# Patient Record
Sex: Male | Born: 1950
Health system: Southern US, Community
[De-identification: ages and names within clinical notes are randomized; demographics above are authoritative.]

## PROBLEM LIST (undated history)

## (undated) DIAGNOSIS — H401190 Primary open-angle glaucoma, unspecified eye, stage unspecified: Secondary | ICD-10-CM

## (undated) DIAGNOSIS — R972 Elevated prostate specific antigen [PSA]: Secondary | ICD-10-CM

## (undated) DIAGNOSIS — C801 Malignant (primary) neoplasm, unspecified: Secondary | ICD-10-CM

## (undated) DIAGNOSIS — Z720 Tobacco use: Secondary | ICD-10-CM

## (undated) DIAGNOSIS — J449 Chronic obstructive pulmonary disease, unspecified: Secondary | ICD-10-CM

## (undated) DIAGNOSIS — N138 Other obstructive and reflux uropathy: Secondary | ICD-10-CM

## (undated) DIAGNOSIS — N401 Enlarged prostate with lower urinary tract symptoms: Secondary | ICD-10-CM

## (undated) DIAGNOSIS — Z9981 Dependence on supplemental oxygen: Secondary | ICD-10-CM

## (undated) DIAGNOSIS — Z972 Presence of dental prosthetic device (complete) (partial): Secondary | ICD-10-CM

## (undated) DIAGNOSIS — I10 Essential (primary) hypertension: Secondary | ICD-10-CM

## (undated) DIAGNOSIS — Z8546 Personal history of malignant neoplasm of prostate: Secondary | ICD-10-CM

## (undated) DIAGNOSIS — J939 Pneumothorax, unspecified: Secondary | ICD-10-CM

## (undated) DIAGNOSIS — J9611 Chronic respiratory failure with hypoxia: Secondary | ICD-10-CM

## (undated) HISTORY — DX: Tobacco use: Z72.0

## (undated) HISTORY — PX: COLON SURGERY: SHX602

## (undated) HISTORY — DX: Elevated prostate specific antigen (PSA): R97.20

## (undated) HISTORY — DX: Other obstructive and reflux uropathy: N13.8

## (undated) HISTORY — DX: Essential (primary) hypertension: I10

## (undated) HISTORY — DX: Benign prostatic hyperplasia with lower urinary tract symptoms: N40.1

---

## 2013-09-29 ENCOUNTER — Ambulatory Visit: Payer: Self-pay | Admitting: Family Medicine

## 2014-11-08 ENCOUNTER — Telehealth: Payer: Self-pay

## 2014-11-08 NOTE — Telephone Encounter (Signed)
Can pt have refill of tamsulosin?

## 2014-11-08 NOTE — Telephone Encounter (Signed)
Yes, but he should have an appointment in 6 months.

## 2014-11-09 NOTE — Telephone Encounter (Signed)
Called to ask pt which pharmacy he would like to use. Number in chart has been disconnected. Therefore medication has not been called into pharmacy. Cw,lpn

## 2014-11-10 ENCOUNTER — Telehealth: Payer: Self-pay

## 2014-11-10 DIAGNOSIS — N400 Enlarged prostate without lower urinary tract symptoms: Secondary | ICD-10-CM

## 2014-11-10 MED ORDER — DUTASTERIDE 0.5 MG PO CAPS: 0.5000 mg | ORAL_CAPSULE | Freq: Every day | ORAL | Status: DC

## 2014-11-10 NOTE — Telephone Encounter (Signed)
[facility] faxed a refill request. Nurse faxed back refill and made note of pt needing f/u appt in September and [facility] needs a correct phone number for pt. Nurse also sent refill via epic. Cw,lpn

## 2014-11-24 ENCOUNTER — Other Ambulatory Visit: Payer: Self-pay

## 2015-01-03 ENCOUNTER — Other Ambulatory Visit: Payer: PRIVATE HEALTH INSURANCE

## 2015-01-03 ENCOUNTER — Encounter: Payer: Self-pay | Admitting: *Deleted

## 2015-01-03 ENCOUNTER — Other Ambulatory Visit: Payer: Self-pay

## 2015-01-03 DIAGNOSIS — R972 Elevated prostate specific antigen [PSA]: Secondary | ICD-10-CM

## 2015-01-04 LAB — PSA: PROSTATE SPECIFIC AG, SERUM: 4.4 ng/mL — AB (ref 0.0–4.0)

## 2015-01-10 ENCOUNTER — Encounter: Payer: Self-pay | Admitting: Urology

## 2015-01-10 ENCOUNTER — Ambulatory Visit (INDEPENDENT_AMBULATORY_CARE_PROVIDER_SITE_OTHER): Payer: PRIVATE HEALTH INSURANCE | Admitting: Urology

## 2015-01-10 VITALS — BP 132/88 | HR 72 | Ht 76.0 in | Wt 180.2 lb

## 2015-01-10 DIAGNOSIS — C61 Malignant neoplasm of prostate: Secondary | ICD-10-CM | POA: Insufficient documentation

## 2015-01-10 DIAGNOSIS — N138 Other obstructive and reflux uropathy: Secondary | ICD-10-CM | POA: Insufficient documentation

## 2015-01-10 DIAGNOSIS — R972 Elevated prostate specific antigen [PSA]: Secondary | ICD-10-CM

## 2015-01-10 DIAGNOSIS — N401 Enlarged prostate with lower urinary tract symptoms: Secondary | ICD-10-CM | POA: Diagnosis not present

## 2015-01-10 LAB — BLADDER SCAN AMB NON-IMAGING: Scan Result: 0

## 2015-01-10 MED ORDER — CIALIS 20 MG PO TABS: ORAL_TABLET | ORAL | Status: DC

## 2015-01-10 NOTE — Progress Notes (Signed)
01/10/2015 9:53 AM   Stephen Madden 31-Mar-1951 [id number]  Referring provider: No referring provider defined for this encounter.  Chief Complaint  Patient presents with  . Elevated PSA    6 month recheck and psa lab results from the 15th  . Sperm in urine    Patient states the med dutasteride causes this?    HPI: Stephen Madden is a 64 year old African American male with a history of elevated PSA, ED and BPH with LUTS who presents today for a 6 month follow up.    Elevated PSA: Patient has had an elevation of his PSA from 2.8 ng/mL in 2009 and 5.1 ng/mL in 2015.  This is a trend less than 0.75 ng/mL a year.  He was started on finasteride three months ago for his BPH with LUTS.  Erectile dysfunction: Patient is having adequate response to the Cialis 20 mg.  He denies any painful erections or curvature to his erections.    BPH with LUTS: His IPSS score today is 6, which is mild lower urinary tract symptomatology. He is mostly satisfied with his quality life due to his urinary symptoms. His PVR is 0 mL.  His previous IPSS score was 6/1.   He is not having any significant urinary symptoms at this time.  He denies any dysuria, hematuria or suprapubic pain.   He currently taking dutasteride.     He also denies any recent fevers, chills, nausea or vomiting.  He does not have a family history of PCa.      IPSS      01/10/15 0900       International Prostate Symptom Score   How often have you had the sensation of not emptying your bladder? Less than 1 in 5     How often have you had to urinate less than every two hours? Less than 1 in 5 times     How often have you found you stopped and started again several times when you urinated? Not at All     How often have you found it difficult to postpone urination? Less than 1 in 5 times     How often have you had a weak urinary stream? Less than half the time     How often have you had to strain to start urination? Not at All     How  many times did you typically get up at night to urinate? 1 Time     Total IPSS Score 6     Quality of Life due to urinary symptoms   If you were to spend the rest of your life with your urinary condition just the way it is now how would you feel about that? Mostly Satisfied        Score:  1-7 Mild 8-19 Moderate 20-35 Severe     PMH: Past Medical History  Diagnosis Date  . BPH with obstruction/lower urinary tract symptoms   . Elevated PSA   . Tobacco abuse   . Hypertension     Surgical History: No past surgical history on file.  Home Medications:    Medication List       This list is accurate as of: 01/10/15  9:53 AM.  Always use your most recent med list.               atenolol 50 MG tablet  Commonly known as:  TENORMIN  Take 50 mg by mouth daily.     CIALIS 20  MG tablet  Generic drug:  tadalafil  TAKE 1 TABLET BY MOUTH 1/2 TO 1 HOUR BEFORE SEXUAL ACTIVITY     clotrimazole-betamethasone cream  Commonly known as:  LOTRISONE  APPLY TO AFFECTED AREA TWICE A DAY     dutasteride 0.5 MG capsule  Commonly known as:  AVODART  Take 1 capsule (0.5 mg total) by mouth daily.     finasteride 5 MG tablet  Commonly known as:  PROSCAR  Take 5 mg by mouth daily.     NIFEdipine 30 MG 24 hr tablet  Commonly known as:  PROCARDIA-XL/ADALAT CC  Take 30 mg by mouth daily.     PROAIR HFA 108 (90 BASE) MCG/ACT inhaler  Generic drug:  albuterol  INHALE 2 PUFFS BY MOUTH EVERY 4 TO 6 HOURS AS NEEDED        Allergies: No Known Allergies  Family History: Family History  Problem Relation Age of Onset  . Heart disease Mother   . Stroke Father   . Cancer Neg Hx   . Kidney disease Neg Hx   . Prostate cancer Neg Hx     Social History:  reports that he has been smoking Cigarettes.  He has a 30 pack-year smoking history. He does not have any smokeless tobacco history on file. He reports that he drinks alcohol. He reports that he does not use illicit  drugs.  ROS: UROLOGY Frequent Urination?: No Hard to postpone urination?: No Burning/pain with urination?: No Get up at night to urinate?: No Leakage of urine?: No Urine stream starts and stops?: Yes Trouble starting stream?: No Do you have to strain to urinate?: No Blood in urine?: No Urinary tract infection?: No Sexually transmitted disease?: No Injury to kidneys or bladder?: No Painful intercourse?: No Weak stream?: No Erection problems?: No Penile pain?: No  Gastrointestinal Nausea?: No Vomiting?: No Indigestion/heartburn?: No Diarrhea?: No Constipation?: No  Constitutional Fever: No Night sweats?: No Weight loss?: No Fatigue?: No  Skin Skin rash/lesions?: No Itching?: No  Eyes Blurred vision?: No Double vision?: No  Ears/Nose/Throat Sore throat?: No Sinus problems?: No  Hematologic/Lymphatic Swollen glands?: No Easy bruising?: No  Cardiovascular Leg swelling?: No Chest pain?: No  Respiratory Cough?: Yes Shortness of breath?: No  Endocrine Excessive thirst?: No  Musculoskeletal Back pain?: No Joint pain?: No  Neurological Headaches?: No Dizziness?: No  Psychologic Depression?: No Anxiety?: No  Physical Exam: BP 132/88 mmHg  Pulse 72  Ht 6\' 4"  (1.93 m)  Wt 180 lb 3.2 oz (81.738 kg)  BMI 21.94 kg/m2  GU: Patient with uncircumcised phallus. Foreskin easily retracted.  Urethral meatus is patent.  No penile discharge. No penile lesions or rashes. Scrotum without lesions, cysts, rashes and/or edema.  Testicles are located scrotally bilaterally. No masses are appreciated in the testicles. Left and right epididymis are normal. Rectal: Patient with  normal sphincter tone. Perineum without scarring or rashes. No rectal masses are appreciated. Prostate is approximately 70 (could no palpate the entire gland due to patient clenching his buttocks) grams, no nodules are appreciated. Seminal vesicles are normal.   Laboratory Data:  Lab Results   Component Value Date   PSA 4.4* 01/03/2015   PSA History: 2.8 ng/mL on 06/12/2007 3.4 ng/mL on 03/31/2008 3.5 ng/mL on 04/06/2009 4.2 ng/mL on 09/05/2010 4.3 ng/mL on 02/26/2011 3.8 ng/mL on 02/05/2012 4.6 ng/mL on 01/27/2013 5.1 ng/mL on 02/09/2014 4.5 ng/mL on 03/26/2014   5.7 ng/mL on 09/25/2014- PSA, free 1.05  Pertinent Imaging: Results for orders placed or  performed in visit on 01/10/15  BLADDER SCAN AMB NON-IMAGING  Result Value Ref Range   Scan Result 0     Assessment & Plan:    1. Elevated PSA:    Patient's PSA trend is less that 0.75 ng/mL per year and his free and total PSA indicate a 17% probability of prostate cancer.  There is no family history of PCa.  We will continue to monitor very closely with PSA's every 6 months.  If his PSA does not continue a downward trend with the finasteride, we will consider a biopsy.  2. BPH with LUTS:   IPSS score 6/2. PVR 0 mL. DRE demonstrates enlargement. Patient will continue the dutasteride as he is mostly satisfied with his urinary symptoms. He was experiencing retrograde ejaculation and I reassured him that this was a side effect of the medication and not a danger to him. He will return in 6 months time for IPSS, DRE, PSA and PVR.   - BLADDER SCAN AMB NON-IMAGING   No Follow-up on file.  Zara Council, [facility] [address] [place], [place] [address] [phone]

## 2015-02-27 ENCOUNTER — Other Ambulatory Visit: Payer: Self-pay | Admitting: Urology

## 2015-03-17 ENCOUNTER — Telehealth: Payer: Self-pay | Admitting: Urology

## 2015-03-17 DIAGNOSIS — N400 Enlarged prostate without lower urinary tract symptoms: Secondary | ICD-10-CM

## 2015-03-17 MED ORDER — DUTASTERIDE 0.5 MG PO CAPS: 0.5000 mg | ORAL_CAPSULE | Freq: Every day | ORAL | Status: DC

## 2015-03-17 NOTE — Telephone Encounter (Signed)
Medication called into pharmacy and pt aware.

## 2015-03-17 NOTE — Telephone Encounter (Signed)
Pt states that he has about 15 days left of his Tamsulosin.  Requesting a Rx refill called in to [facility] on [address].  Pt's phone # is [phone].

## 2015-03-18 ENCOUNTER — Telehealth: Payer: Self-pay | Admitting: Radiology

## 2015-03-18 DIAGNOSIS — N400 Enlarged prostate without lower urinary tract symptoms: Secondary | ICD-10-CM

## 2015-03-18 MED ORDER — DUTASTERIDE 0.5 MG PO CAPS: 0.5000 mg | ORAL_CAPSULE | Freq: Every day | ORAL | Status: DC

## 2015-03-18 NOTE — Telephone Encounter (Signed)
Pt states dutasteride (AVODART) 0.5 MG capsule was called into the wrong pharmacy.  Also asks for refills to last through December. Wants it called in to [facility] on [address]. He asks for a return phone call. Pt's phone # is [phone].

## 2015-03-18 NOTE — Telephone Encounter (Signed)
Medication called into pt pharmacy. Pt made aware.

## 2015-04-19 ENCOUNTER — Telehealth: Payer: Self-pay | Admitting: Gastroenterology

## 2015-04-19 NOTE — Telephone Encounter (Signed)
Colonoscopy triage °

## 2015-04-25 ENCOUNTER — Other Ambulatory Visit: Payer: Self-pay

## 2015-04-25 ENCOUNTER — Telehealth: Payer: Self-pay | Admitting: Gastroenterology

## 2015-04-25 NOTE — Telephone Encounter (Signed)
Pt scheduled for colonoscopy in [place] on 05/30/15. Instructions/rx mailed.

## 2015-04-25 NOTE — Telephone Encounter (Signed)
Colonoscopy triage. Patient has called you several times

## 2015-04-25 NOTE — Telephone Encounter (Signed)
Gastroenterology Pre-Procedure Review  Request Date:  Requesting Physician: Dr. Lavera Guise  PATIENT REVIEW QUESTIONS: The patient responded to the following health history questions as indicated:    1. Are you having any GI issues? no 2. Do you have a personal history of Polyps? no 3. Do you have a family history of Colon Cancer or Polyps? no 4. Diabetes Mellitus? no 5. Joint replacements in the past 12 months?no 6. Major health problems in the past 3 months?no 7. Any artificial heart valves, MVP, or defibrillator?no    MEDICATIONS & ALLERGIES:    Patient reports the following regarding taking any anticoagulation/antiplatelet therapy:   Plavix, Coumadin, Eliquis, Xarelto, Lovenox, Pradaxa, Brilinta, or Effient? no Aspirin? no  Patient confirms/reports the following medications:  Current Outpatient Prescriptions  Medication Sig Dispense Refill  . atenolol (TENORMIN) 50 MG tablet Take 50 mg by mouth daily.    Marland Kitchen CIALIS 20 MG tablet TAKE 1 TABLET BY MOUTH 1/2 TO 1 HOUR BEFORE SEXUAL ACTIVITY 10 tablet 6  . clotrimazole-betamethasone (LOTRISONE) cream APPLY TO AFFECTED AREA TWICE A DAY  1  . dutasteride (AVODART) 0.5 MG capsule Take 1 capsule (0.5 mg total) by mouth daily. 30 capsule 3  . NIFEdipine (PROCARDIA-XL/ADALAT CC) 30 MG 24 hr tablet Take 30 mg by mouth daily.    Marland Kitchen PROAIR HFA 108 (90 BASE) MCG/ACT inhaler INHALE 2 PUFFS BY MOUTH EVERY 4 TO 6 HOURS AS NEEDED  1   No current facility-administered medications for this visit.    Patient confirms/reports the following allergies:  No Known Allergies  No orders of the defined types were placed in this encounter.    AUTHORIZATION INFORMATION Primary Insurance: 1D#: Group #:  Secondary Insurance: 1D#: Group #:  SCHEDULE INFORMATION: Date: 05/30/15 Time: Location: [place]

## 2015-04-25 NOTE — Telephone Encounter (Signed)
LVM for pt to return my call to schedule colonoscopy.  

## 2015-05-20 ENCOUNTER — Encounter: Payer: Self-pay | Admitting: *Deleted

## 2015-05-26 NOTE — Discharge Instructions (Signed)

## 2015-05-27 ENCOUNTER — Telehealth: Payer: Self-pay | Admitting: Gastroenterology

## 2015-05-27 NOTE — Telephone Encounter (Signed)
Referral has been obtained from [employer] insurance by the PCP, Dr Lavera Guise per Caryl Pina. Referral number is [id number] effective dates: 05/27/15-11/23/15.

## 2015-05-27 NOTE — Telephone Encounter (Signed)
I have called Dr Jennette Kettle office and left a message on voicemail with Caryl Pina, the referral cordonader. Patient will require a referral from [employer] insurance from his PCP so that the colonoscopy visit will be covered by insurance. I have left all insurance information on the voicemail and asked for her to call me back with the referral number so i can add it to the chart.

## 2015-05-30 ENCOUNTER — Ambulatory Visit: Payer: PPO | Admitting: Anesthesiology

## 2015-05-30 ENCOUNTER — Other Ambulatory Visit: Payer: Self-pay | Admitting: Gastroenterology

## 2015-05-30 ENCOUNTER — Ambulatory Visit
Admission: RE | Admit: 2015-05-30 | Discharge: 2015-05-30 | Disposition: A | Discharge status: Home / Self Care | Payer: PPO | Source: Ambulatory Visit | Attending: Gastroenterology | Admitting: Gastroenterology

## 2015-05-30 ENCOUNTER — Encounter
Admission: RE | Disposition: A | Discharge status: Home / Self Care | Payer: Self-pay | Source: Ambulatory Visit | Attending: Gastroenterology

## 2015-05-30 DIAGNOSIS — N400 Enlarged prostate without lower urinary tract symptoms: Secondary | ICD-10-CM | POA: Insufficient documentation

## 2015-05-30 DIAGNOSIS — Z79899 Other long term (current) drug therapy: Secondary | ICD-10-CM | POA: Insufficient documentation

## 2015-05-30 DIAGNOSIS — I10 Essential (primary) hypertension: Secondary | ICD-10-CM | POA: Insufficient documentation

## 2015-05-30 DIAGNOSIS — K388 Other specified diseases of appendix: Secondary | ICD-10-CM | POA: Diagnosis not present

## 2015-05-30 DIAGNOSIS — F17210 Nicotine dependence, cigarettes, uncomplicated: Secondary | ICD-10-CM | POA: Insufficient documentation

## 2015-05-30 DIAGNOSIS — D124 Benign neoplasm of descending colon: Secondary | ICD-10-CM | POA: Diagnosis not present

## 2015-05-30 DIAGNOSIS — D123 Benign neoplasm of transverse colon: Secondary | ICD-10-CM | POA: Insufficient documentation

## 2015-05-30 DIAGNOSIS — Z823 Family history of stroke: Secondary | ICD-10-CM | POA: Insufficient documentation

## 2015-05-30 DIAGNOSIS — D120 Benign neoplasm of cecum: Secondary | ICD-10-CM | POA: Insufficient documentation

## 2015-05-30 DIAGNOSIS — Z1211 Encounter for screening for malignant neoplasm of colon: Secondary | ICD-10-CM | POA: Diagnosis not present

## 2015-05-30 DIAGNOSIS — K5790 Diverticulosis of intestine, part unspecified, without perforation or abscess without bleeding: Secondary | ICD-10-CM | POA: Diagnosis not present

## 2015-05-30 DIAGNOSIS — D125 Benign neoplasm of sigmoid colon: Secondary | ICD-10-CM | POA: Diagnosis not present

## 2015-05-30 DIAGNOSIS — K635 Polyp of colon: Secondary | ICD-10-CM | POA: Diagnosis not present

## 2015-05-30 DIAGNOSIS — Z8249 Family history of ischemic heart disease and other diseases of the circulatory system: Secondary | ICD-10-CM | POA: Diagnosis not present

## 2015-05-30 DIAGNOSIS — D122 Benign neoplasm of ascending colon: Secondary | ICD-10-CM | POA: Insufficient documentation

## 2015-05-30 DIAGNOSIS — Z0000 Encounter for general adult medical examination without abnormal findings: Secondary | ICD-10-CM | POA: Insufficient documentation

## 2015-05-30 DIAGNOSIS — D121 Benign neoplasm of appendix: Secondary | ICD-10-CM | POA: Diagnosis not present

## 2015-05-30 DIAGNOSIS — K621 Rectal polyp: Secondary | ICD-10-CM | POA: Diagnosis not present

## 2015-05-30 HISTORY — PX: COLONOSCOPY WITH PROPOFOL: SHX5780

## 2015-05-30 HISTORY — PX: POLYPECTOMY: SHX5525

## 2015-05-30 SURGERY — COLONOSCOPY WITH PROPOFOL
Anesthesia: Monitor Anesthesia Care | Wound class: Contaminated

## 2015-05-30 MED ORDER — ALBUTEROL SULFATE HFA 108 (90 BASE) MCG/ACT IN AERS
2.0000 | INHALATION_SPRAY | Freq: Once | RESPIRATORY_TRACT | Status: AC
  Administered 2015-05-30: 2 via RESPIRATORY_TRACT

## 2015-05-30 MED ORDER — STERILE WATER FOR IRRIGATION IR SOLN
Status: DC | PRN
  Administered 2015-05-30: 10:00:00

## 2015-05-30 MED ORDER — ACETAMINOPHEN 325 MG PO TABS: 325.0000 mg | ORAL_TABLET | ORAL | Status: DC | PRN

## 2015-05-30 MED ORDER — LACTATED RINGERS IV SOLN
INTRAVENOUS | Status: DC
  Administered 2015-05-30: 09:00:00 via INTRAVENOUS

## 2015-05-30 MED ORDER — LIDOCAINE HCL (CARDIAC) 20 MG/ML IV SOLN
INTRAVENOUS | Status: DC | PRN
  Administered 2015-05-30: 30 mg via INTRAVENOUS

## 2015-05-30 MED ORDER — ACETAMINOPHEN 160 MG/5ML PO SOLN: 325.0000 mg | ORAL | Status: DC | PRN

## 2015-05-30 MED ORDER — PROPOFOL 10 MG/ML IV BOLUS
INTRAVENOUS | Status: DC | PRN
  Administered 2015-05-30 (×21): 20 mg via INTRAVENOUS
  Administered 2015-05-30: 100 mg via INTRAVENOUS
  Administered 2015-05-30: 40 mg via INTRAVENOUS
  Administered 2015-05-30 (×3): 20 mg via INTRAVENOUS

## 2015-05-30 SURGICAL SUPPLY — 28 items
CANISTER SUCT 1200ML W/VALVE (MISCELLANEOUS) ×4 IMPLANT
FCP ESCP3.2XJMB 240X2.8X (MISCELLANEOUS)
FORCEPS BIOP RAD 4 LRG CAP 4 (CUTTING FORCEPS) IMPLANT
FORCEPS BIOP RJ4 240 W/NDL (MISCELLANEOUS)
FORCEPS ESCP3.2XJMB 240X2.8X (MISCELLANEOUS) IMPLANT
GOWN CVR UNV OPN BCK APRN NK (MISCELLANEOUS) ×4 IMPLANT
GOWN ISOL THUMB LOOP REG UNIV (MISCELLANEOUS) ×4
HEMOCLIP INSTINCT (CLIP) ×20 IMPLANT
INJECTOR VARIJECT VIN23 (MISCELLANEOUS) IMPLANT
KIT CO2 TUBING (TUBING) IMPLANT
KIT DEFENDO VALVE AND CONN (KITS) IMPLANT
KIT ENDO PROCEDURE OLY (KITS) ×4 IMPLANT
LIGATOR MULTIBAND 6SHOOTER MBL (MISCELLANEOUS) IMPLANT
MARKER SPOT ENDO TATTOO 5ML (MISCELLANEOUS) IMPLANT
PAD GROUND ADULT SPLIT (MISCELLANEOUS) IMPLANT
SNARE SHORT THROW 13M SML OVAL (MISCELLANEOUS) ×4 IMPLANT
SNARE SHORT THROW 30M LRG OVAL (MISCELLANEOUS) ×4 IMPLANT
SPOT EX ENDOSCOPIC TATTOO (MISCELLANEOUS)
SUCTION POLY TRAP 4CHAMBER (MISCELLANEOUS) IMPLANT
TRAP SUCTION POLY (MISCELLANEOUS) ×8 IMPLANT
TUBING CONN 6MMX3.1M (TUBING)
TUBING SUCTION CONN 0.25 STRL (TUBING) IMPLANT
UNDERPAD 30X60 958B10 (PK) (MISCELLANEOUS) IMPLANT
VALVE BIOPSY ENDO (VALVE) IMPLANT
VARIJECT INJECTOR VIN23 (MISCELLANEOUS)
WATER AUXILLARY (MISCELLANEOUS) IMPLANT
WATER STERILE IRR 250ML POUR (IV SOLUTION) ×4 IMPLANT
WATER STERILE IRR 500ML POUR (IV SOLUTION) IMPLANT

## 2015-05-30 NOTE — Anesthesia Postprocedure Evaluation (Signed)
Anesthesia Post Note  Patient: Stephen Madden  Procedure(s) Performed: Procedure(s) (LRB): COLONOSCOPY WITH PROPOFOL (N/A) POLYPECTOMY  Patient location during evaluation: PACU Anesthesia Type: MAC Level of consciousness: awake and alert and oriented Pain management: satisfactory to patient Vital Signs Assessment: post-procedure vital signs reviewed and stable Respiratory status: spontaneous breathing, nonlabored ventilation and respiratory function stable Cardiovascular status: blood pressure returned to baseline and stable Postop Assessment: Adequate PO intake and No signs of nausea or vomiting Anesthetic complications: no    Raliegh Ip

## 2015-05-30 NOTE — Anesthesia Preprocedure Evaluation (Signed)
Anesthesia Evaluation  Patient identified by MRN, date of birth, ID band  Reviewed: Allergy & Precautions, H&P , NPO status , Patient's Chart, lab work & pertinent test results  Airway Mallampati: II  TM Distance: >3 FB Neck ROM: full    Dental no notable dental hx.    Pulmonary Current Smoker,     + wheezing      Cardiovascular hypertension,  Rhythm:regular Rate:Normal     Neuro/Psych    GI/Hepatic   Endo/Other    Renal/GU      Musculoskeletal   Abdominal   Peds  Hematology   Anesthesia Other Findings Exp wheeze B.  Reproductive/Obstetrics                             Anesthesia Physical Anesthesia Plan  ASA: II  Anesthesia Plan: MAC   Post-op Pain Management:    Induction:   Airway Management Planned:   Additional Equipment:   Intra-op Plan:   Post-operative Plan:   Informed Consent: I have reviewed the patients History and Physical, chart, labs and discussed the procedure including the risks, benefits and alternatives for the proposed anesthesia with the patient or authorized representative who has indicated his/her understanding and acceptance.     Plan Discussed with: CRNA  Anesthesia Plan Comments: (Pt to use Albuterol MDI pre-op.  Likely baseline wheeze from smoking/COPD.)        Anesthesia Quick Evaluation

## 2015-05-30 NOTE — Op Note (Signed)
[facility] Gastroenterology Patient Name: Stephen Madden Procedure Date: 05/30/2015 9:24 AM MRN: [id number] Account #: [id number] Date of Birth: 1950/06/17 Admit Type: Outpatient Age: 65 Room: [facility] OR ROOM 01 Gender: Male Note Status: Finalized Procedure:         Colonoscopy Indications:       Screening for colorectal malignant neoplasm Providers:         Lucilla Lame, MD Referring MD:      Cletis Athens, MD (Referring MD) Medicines:         Propofol per Anesthesia Complications:     No immediate complications. Procedure:         Pre-Anesthesia Assessment:                    - Prior to the procedure, a History and Physical was                     performed, and patient medications and allergies were                     reviewed. The patient's tolerance of previous anesthesia                     was also reviewed. The risks and benefits of the procedure                     and the sedation options and risks were discussed with the                     patient. All questions were answered, and informed consent                     was obtained. Prior Anticoagulants: The patient has taken                     no previous anticoagulant or antiplatelet agents. ASA                     Grade Assessment: II - A patient with mild systemic                     disease. After reviewing the risks and benefits, the                     patient was deemed in satisfactory condition to undergo                     the procedure.                    After obtaining informed consent, the colonoscope was                     passed under direct vision. Throughout the procedure, the                     patient's blood pressure, pulse, and oxygen saturations                     were monitored continuously. The Olympus CF H180AL                     colonoscope (S#: [id number]) was introduced through the anus  and advanced to the the cecum, identified by appendiceal     orifice and ileocecal valve. The colonoscopy was performed                     without difficulty. The patient tolerated the procedure                     well. The quality of the bowel preparation was excellent. Findings:      The perianal and digital rectal examinations were normal.      A 25 mm polyp was found in the cecum. The polyp was sessile. The polyp       was removed with a hot snare. Resection and retrieval were complete. To       prevent bleeding after the polypectomy, three hemostatic clips were       successfully placed (MRI compatible). There was no bleeding at the end       of the procedure.      A 7 mm polyp was found in the cecum. The polyp was sessile. The polyp       was removed with a cold snare. Resection and retrieval were complete.      A 8 mm polyp was found at the appendiceal orifice. The polyp was       sessile. This was biopsied with a cold snare for histology.      Four sessile polyps were found in the ascending colon. The polyps were 4       to 8 mm in size. These polyps were removed with a cold snare. Resection       and retrieval were complete.      Four sessile polyps were found in the transverse colon. The polyps were       7 to 10 mm in size. These polyps were removed with a cold snare.       Resection and retrieval were complete.      Twelve sessile polyps were found in the descending colon. The polyps       were 6 to 10 mm in size. These polyps were removed with a cold snare.       Resection and retrieval were complete.      A 15 mm polyp was found in the sigmoid colon. The polyp was       pedunculated. The polyp was removed with a hot snare. Resection and       retrieval were complete. To prevent bleeding after the polypectomy, two       hemostatic clips were successfully placed (MRI compatible). There was no       bleeding at the end of the procedure.      A 4 mm polyp was found in the sigmoid colon. The polyp was sessile. The       polyp was  removed with a cold snare. Resection and retrieval were       complete.      Multiple sessile polyps were found in the rectum. The polyps were 3 to 4       mm in size. Polypectomy was not attempted. Impression:        - One 25 mm polyp in the cecum. Resected and retrieved.                     MRI-compatible clips were placed.                    -  One 7 mm polyp in the cecum. Resected and retrieved.                    - One 8 mm polyp at the appendiceal orifice. Biopsied.                    - Four 4 to 8 mm polyps in the ascending colon. Resected                     and retrieved.                    - Four 7 to 10 mm polyps in the transverse colon. Resected                     and retrieved.                    - Twelve 6 to 10 mm polyps in the descending colon.                     Resected and retrieved.                    - One 15 mm polyp in the sigmoid colon. Resected and                     retrieved. MRI-compatible clips were placed.                    - One 4 mm polyp in the sigmoid colon. Resected and                     retrieved.                    - Multiple 3 to 4 mm polyps in the rectum. Resection not                     attempted. Recommendation:    - Low fiber diet for 1 week.                    - Await pathology results.                    - Return to my office in 2 weeks. Procedure Code(s): --- Professional ---                    [phone], Colonoscopy, flexible; with removal of tumor(s),                     polyp(s), or other lesion(s) by snare technique Diagnosis Code(s): --- Professional ---                    Z12.11, Encounter for screening for malignant neoplasm of                     colon                    D12.0, Benign neoplasm of cecum                    D12.1, Benign neoplasm of appendix                    D12.5, Benign neoplasm of sigmoid  colon                    D12.2, Benign neoplasm of ascending colon                    D12.3, Benign neoplasm of transverse  colon                    D12.4, Benign neoplasm of descending colon                    K62.1, Rectal polyp CPT copyright 2014 American Medical Association. All rights reserved. The codes documented in this report are preliminary and upon coder review may  be revised to meet current compliance requirements. Lucilla Lame, MD 05/30/2015 10:34:48 AM This report has been signed electronically. Number of Addenda: 0 Note Initiated On: 05/30/2015 9:24 AM Scope Withdrawal Time: 0 hours 51 minutes 16 seconds  Total Procedure Duration: 0 hours 56 minutes 3 seconds       [facility]

## 2015-05-30 NOTE — Transfer of Care (Signed)
Immediate Anesthesia Transfer of Care Note  Patient: Stephen Madden  Procedure(s) Performed: Procedure(s): COLONOSCOPY WITH PROPOFOL (N/A) POLYPECTOMY  Patient Location: PACU  Anesthesia Type: MAC  Level of Consciousness: awake, alert  and patient cooperative  Airway and Oxygen Therapy: Patient Spontanous Breathing and Patient connected to supplemental oxygen  Post-op Assessment: Post-op Vital signs reviewed, Patient's Cardiovascular Status Stable, Respiratory Function Stable, Patent Airway and No signs of Nausea or vomiting  Post-op Vital Signs: Reviewed and stable  Complications: No apparent anesthesia complications

## 2015-05-30 NOTE — H&P (Signed)
  [facility]  [address] [place], [place] [address] Phone: [phone] Fax : [phone]  Primary Care Physician:  Cletis Athens, MD Primary Gastroenterologist:  Dr. Allen Norris  Pre-Procedure History & Physical: HPI:  Stephen Madden is a 65 y.o. male is here for a screening colonoscopy.   Past Medical History  Diagnosis Date  . BPH with obstruction/lower urinary tract symptoms   . Elevated PSA   . Tobacco abuse   . Hypertension     History reviewed. No pertinent past surgical history.  Prior to Admission medications   Medication Sig Start Date End Date Taking? Authorizing Provider  atenolol (TENORMIN) 50 MG tablet Take 50 mg by mouth daily.   Yes Historical Provider, MD  CIALIS 20 MG tablet TAKE 1 TABLET BY MOUTH 1/2 TO 1 HOUR BEFORE SEXUAL ACTIVITY 01/10/15  Yes Shannon A McGowan, PA-C  clotrimazole-betamethasone (LOTRISONE) cream APPLY TO AFFECTED AREA TWICE A DAY 12/14/14  Yes Historical Provider, MD  dutasteride (AVODART) 0.5 MG capsule Take 1 capsule (0.5 mg total) by mouth daily. 03/18/15  Yes Shannon A McGowan, PA-C  NIFEdipine (PROCARDIA-XL/ADALAT CC) 30 MG 24 hr tablet Take 30 mg by mouth daily.   Yes Historical Provider, MD  PROAIR HFA 108 (90 BASE) MCG/ACT inhaler INHALE 2 PUFFS BY MOUTH EVERY 4 TO 6 HOURS AS NEEDED 12/25/14  Yes Historical Provider, MD    Allergies as of 04/25/2015  . (No Known Allergies)    Family History  Problem Relation Age of Onset  . Heart disease Mother   . Stroke Father   . Cancer Neg Hx   . Kidney disease Neg Hx   . Prostate cancer Neg Hx     Social History   Social History  . Marital Status: Married    Spouse Name: N/A  . Number of Children: N/A  . Years of Education: N/A   Occupational History  . Not on file.   Social History Main Topics  . Smoking status: Current Every Day Smoker -- 1.00 packs/day for 30 years    Types: Cigarettes  . Smokeless tobacco: Not on file  . Alcohol Use: 0.0 oz/week    0  Standard drinks or equivalent per week     Comment: occasional  . Drug Use: No  . Sexual Activity: Not on file   Other Topics Concern  . Not on file   Social History Narrative    Review of Systems: See HPI, otherwise negative ROS  Physical Exam: BP 158/102 mmHg  Pulse 68  Temp(Src) 97.8 F (36.6 C) (Temporal)  Resp 16  Ht 6\' 4"  (1.93 m)  Wt 183 lb (83.008 kg)  BMI 22.28 kg/m2  SpO2 99% General:   Alert,  pleasant and cooperative in NAD Head:  Normocephalic and atraumatic. Neck:  Supple; no masses or thyromegaly. Lungs:  Clear throughout to auscultation.    Heart:  Regular rate and rhythm. Abdomen:  Soft, nontender and nondistended. Normal bowel sounds, without guarding, and without rebound.   Neurologic:  Alert and  oriented x4;  grossly normal neurologically.  Impression/Plan: Stephen Madden is now here to undergo a screening colonoscopy.  Risks, benefits, and alternatives regarding colonoscopy have been reviewed with the patient.  Questions have been answered.  All parties agreeable.

## 2015-05-31 ENCOUNTER — Encounter: Payer: Self-pay | Admitting: Gastroenterology

## 2015-06-06 ENCOUNTER — Telehealth: Payer: Self-pay

## 2015-06-06 NOTE — Telephone Encounter (Signed)
Pt has a follow up appt scheduled for Wed, Jan 18th. Pt is aware of appt date, time and location.

## 2015-06-06 NOTE — Telephone Encounter (Signed)
-----   Message from Lucilla Lame, MD sent at 06/06/2015  7:59 AM EST ----- This patient had a polyp that was at the appendix that needs to be surgically removed. Please have him follow up in the office with me.

## 2015-06-08 ENCOUNTER — Ambulatory Visit (INDEPENDENT_AMBULATORY_CARE_PROVIDER_SITE_OTHER): Payer: PPO | Admitting: Gastroenterology

## 2015-06-08 ENCOUNTER — Ambulatory Visit (INDEPENDENT_AMBULATORY_CARE_PROVIDER_SITE_OTHER): Payer: PPO | Admitting: Surgery

## 2015-06-08 ENCOUNTER — Encounter: Payer: Self-pay | Admitting: Gastroenterology

## 2015-06-08 ENCOUNTER — Encounter: Payer: Self-pay | Admitting: Surgery

## 2015-06-08 VITALS — BP 139/85 | HR 95 | Temp 99.1°F | Ht 76.0 in | Wt 186.0 lb

## 2015-06-08 VITALS — BP 137/77 | HR 108 | Temp 99.4°F | Ht 76.0 in | Wt 186.0 lb

## 2015-06-08 DIAGNOSIS — D121 Benign neoplasm of appendix: Secondary | ICD-10-CM

## 2015-06-08 DIAGNOSIS — D120 Benign neoplasm of cecum: Secondary | ICD-10-CM | POA: Diagnosis not present

## 2015-06-08 DIAGNOSIS — D124 Benign neoplasm of descending colon: Secondary | ICD-10-CM

## 2015-06-08 DIAGNOSIS — Z86010 Personal history of colonic polyps: Secondary | ICD-10-CM | POA: Diagnosis not present

## 2015-06-08 DIAGNOSIS — D122 Benign neoplasm of ascending colon: Secondary | ICD-10-CM | POA: Diagnosis not present

## 2015-06-08 DIAGNOSIS — K621 Rectal polyp: Secondary | ICD-10-CM | POA: Diagnosis not present

## 2015-06-08 DIAGNOSIS — D125 Benign neoplasm of sigmoid colon: Secondary | ICD-10-CM | POA: Diagnosis not present

## 2015-06-08 DIAGNOSIS — D123 Benign neoplasm of transverse colon: Secondary | ICD-10-CM | POA: Diagnosis not present

## 2015-06-08 NOTE — Patient Instructions (Signed)
You will need to have a portion of a Colon removed. You have requested that we discuss this on a day where the rest of your family can attend an appointment. Please call after you speak with your family and decide on a good day to come in and talk with Dr. Azalee Course about this.

## 2015-06-08 NOTE — Progress Notes (Signed)
Primary Care Physician: Cletis Athens, MD  Primary Gastroenterologist:  Dr. Lucilla Lame  No chief complaint on file.   HPI: Stephen Madden is a 65 y.o. male here for follow-up after having a colonoscopy with greater than 20 polyps taken out. The pathology measurements showed the polyps to be as large as 3 cm. The patient did have 1 polyp that was straddling the appendiceal orifice that was biopsied but not removed. He reports that he has had no problems since the colonoscopy except he thinks he is urinating more frequently than before. All the polyps removed were sent off for pathology and the majority of them were adenomatous. The polyp in the cecum that was straddling the appendiceal orifice was also found to be a serrated polyp.  Current Outpatient Prescriptions  Medication Sig Dispense Refill  . atenolol (TENORMIN) 50 MG tablet Take 50 mg by mouth daily.    Marland Kitchen CIALIS 20 MG tablet TAKE 1 TABLET BY MOUTH 1/2 TO 1 HOUR BEFORE SEXUAL ACTIVITY 10 tablet 6  . clotrimazole-betamethasone (LOTRISONE) cream APPLY TO AFFECTED AREA TWICE A DAY  1  . dutasteride (AVODART) 0.5 MG capsule Take 1 capsule (0.5 mg total) by mouth daily. 30 capsule 3  . NIFEdipine (PROCARDIA-XL/ADALAT CC) 30 MG 24 hr tablet Take 30 mg by mouth daily.    Marland Kitchen PROAIR HFA 108 (90 BASE) MCG/ACT inhaler INHALE 2 PUFFS BY MOUTH EVERY 4 TO 6 HOURS AS NEEDED  1   No current facility-administered medications for this visit.    Allergies as of 06/08/2015  . (No Known Allergies)    ROS:  General: Negative for anorexia, weight loss, fever, chills, fatigue, weakness. ENT: Negative for hoarseness, difficulty swallowing , nasal congestion. CV: Negative for chest pain, angina, palpitations, dyspnea on exertion, peripheral edema.  Respiratory: Negative for dyspnea at rest, dyspnea on exertion, cough, sputum, wheezing.  GI: See history of present illness. GU:  Negative for dysuria, hematuria, urinary incontinence, urinary frequency,  nocturnal urination.  Endo: Negative for unusual weight change.    Physical Examination:   BP 139/85 mmHg  Pulse 95  Temp(Src) 99.1 F (37.3 C) (Oral)  Ht 6\' 4"  (1.93 m)  Wt 186 lb (84.369 kg)  BMI 22.65 kg/m2  General: Well-nourished, well-developed in no acute distress.  Eyes: No icterus. Conjunctivae pink. Mouth: Oropharyngeal mucosa moist and pink , no lesions erythema or exudate. Lungs: Clear to auscultation bilaterally. Non-labored. Heart: Regular rate and rhythm, no murmurs rubs or gallops.  Abdomen: Bowel sounds are normal, nontender, nondistended, no hepatosplenomegaly or masses, no abdominal bruits or hernia , no rebound or guarding.   Extremities: No lower extremity edema. No clubbing or deformities. Neuro: Alert and oriented x 3.  Grossly intact. Skin: Warm and dry, no jaundice.   Psych: Alert and cooperative, normal mood and affect.  Labs:    Imaging Studies: No results found.  Assessment and Plan:   Stephen Madden is a 33 y.o. y/o male comes in for follow-up after having a colonoscopy. The patient had multiple polyps taken throughout his colon in excess of 20. The only polyp that was not removed was the one in the cecum straddling the appendiceal orifice. The patient has been told that the extent of the polyp is not known and it may be growing into the appendiceal orifice. Therefore the patient has been recommended to see a surgeon for surgical removal of this area. As explained the plan and agrees with it and will add an appointment made with  the surgeon.   Note: This dictation was prepared with Dragon dictation along with smaller phrase technology. Any transcriptional errors that result from this process are unintentional.

## 2015-06-09 ENCOUNTER — Telehealth: Payer: Self-pay | Admitting: Surgery

## 2015-06-09 NOTE — Telephone Encounter (Signed)
Patient stated you talked to him yesterday about an appointment with Dr. Azalee Course about surgery. He wanted to know would the 26th be ok?

## 2015-06-09 NOTE — Telephone Encounter (Signed)
Yes this will be fine. I have placed an appointment in for 06/16/15 at 1500 in the [place] office. Please make patient aware of this appointment.

## 2015-06-10 ENCOUNTER — Telehealth: Payer: Self-pay | Admitting: Surgery

## 2015-06-10 ENCOUNTER — Encounter: Payer: Self-pay | Admitting: Surgery

## 2015-06-10 NOTE — Progress Notes (Signed)
Subjective:     Patient ID: Stephen Madden, male   DOB: [birth year], 65 y.o.   MRN: [id number]  HPI  65 yr old male otherwise healthy who underwent first screening colonoscopy last week.  Patient had several large polyps removed from various areas of the colon.  The patient has not noticed any fever, chills, fatigue, weight loss, diarrhea, constipation, melena, blood in stool, change in caliber of stool, or dysuria.  He does not have any family history of colon cancers or multiple polyps.    Past Medical History  Diagnosis Date  . BPH with obstruction/lower urinary tract symptoms   . Elevated PSA   . Tobacco abuse   . Hypertension    Past Surgical History  Procedure Laterality Date  . Colonoscopy with propofol N/A 05/30/2015    Procedure: COLONOSCOPY WITH PROPOFOL;  Surgeon: Lucilla Lame, MD;  Location: [place];  Service: Endoscopy;  Laterality: N/A;  . Polypectomy  05/30/2015    Procedure: POLYPECTOMY;  Surgeon: Lucilla Lame, MD;  Location: [place];  Service: Endoscopy;;   Family History  Problem Relation Age of Onset  . Heart disease Mother   . Stroke Father   . Cancer Neg Hx   . Kidney disease Neg Hx   . Prostate cancer Neg Hx    Social History   Social History  . Marital Status: Married    Spouse Name: N/A  . Number of Children: N/A  . Years of Education: N/A   Social History Main Topics  . Smoking status: Current Every Day Smoker -- 1.00 packs/day for 30 years    Types: Cigarettes  . Smokeless tobacco: Never Used  . Alcohol Use: 0.0 oz/week    0 Standard drinks or equivalent per week     Comment: occasional  . Drug Use: No  . Sexual Activity: Not Asked   Other Topics Concern  . None   Social History Narrative    Current outpatient prescriptions:  .  atenolol (TENORMIN) 50 MG tablet, Take 50 mg by mouth daily., Disp: , Rfl:  .  CIALIS 20 MG tablet, TAKE 1 TABLET BY MOUTH 1/2 TO 1 HOUR BEFORE SEXUAL ACTIVITY, Disp: 10 tablet, Rfl: 6 .   clotrimazole-betamethasone (LOTRISONE) cream, APPLY TO AFFECTED AREA TWICE A DAY, Disp: , Rfl: 1 .  dutasteride (AVODART) 0.5 MG capsule, Take 1 capsule (0.5 mg total) by mouth daily., Disp: 30 capsule, Rfl: 3 .  NIFEdipine (PROCARDIA-XL/ADALAT CC) 30 MG 24 hr tablet, Take 30 mg by mouth daily., Disp: , Rfl:  .  PROAIR HFA 108 (90 BASE) MCG/ACT inhaler, INHALE 2 PUFFS BY MOUTH EVERY 4 TO 6 HOURS AS NEEDED, Disp: , Rfl: 1 No Known Allergies     Review of Systems  Constitutional: Negative for fever, chills, activity change, appetite change, fatigue and unexpected weight change.  HENT: Negative for congestion and sore throat.   Respiratory: Negative for cough, shortness of breath and wheezing.   Cardiovascular: Negative for chest pain, palpitations and leg swelling.  Gastrointestinal: Negative for nausea, vomiting, abdominal pain, diarrhea, constipation, blood in stool, abdominal distention and anal bleeding.  Genitourinary: Negative for dysuria and hematuria.  Musculoskeletal: Negative for back pain and neck pain.  Skin: Negative for color change, pallor, rash and wound.  Neurological: Negative for dizziness and weakness.  Hematological: Negative for adenopathy. Does not bruise/bleed easily.  Psychiatric/Behavioral: Negative for agitation. The patient is not nervous/anxious.   All other systems reviewed and are negative.  Filed Vitals:   06/08/15 1413  BP: 137/77  Pulse: 108  Temp: 99.4 F (37.4 C)    Objective:   Physical Exam  Constitutional: He is oriented to person, place, and time. He appears well-developed and well-nourished. No distress.  HENT:  Head: Normocephalic and atraumatic.  Right Ear: External ear normal.  Left Ear: External ear normal.  Nose: Nose normal.  Mouth/Throat: Oropharynx is clear and moist. No oropharyngeal exudate.  Eyes: Conjunctivae are normal. Pupils are equal, round, and reactive to light. No scleral icterus.  Neck: Normal range of motion.  Neck supple. No tracheal deviation present.  Cardiovascular: Normal rate, regular rhythm, normal heart sounds and intact distal pulses.  Exam reveals no gallop and no friction rub.   No murmur heard. Pulmonary/Chest: Effort normal and breath sounds normal. No respiratory distress. He has no wheezes. He has no rales.  Abdominal: Soft. Bowel sounds are normal. He exhibits no distension and no mass. There is no tenderness. There is no rebound.  Musculoskeletal: Normal range of motion. He exhibits no edema or tenderness.  Lymphadenopathy:    He has no cervical adenopathy.  Neurological: He is alert and oriented to person, place, and time. No cranial nerve deficit.  Skin: Skin is warm and dry. No rash noted. No erythema.  Psychiatric: He has a normal mood and affect. His behavior is normal. Judgment and thought content normal.  Vitals reviewed.      Assessment:     65 yr old male with multiple colonic polyps     Plan:     I discussed with the patient that given the multiple polyps, including >3cm polyp in ascending colon, and serrated polyp at appendiceal orifice that was unable to be completely removed, I would recommend Laparoscopic Right hemicolectomy.  I discussed with him that given the large size and type of polyps that he had about a 30% chance that there was a cancer in the area currently that had not been removed.  I also explained that he could choose to wait and be screened again in a year, but would  not advise this with the type of polyps and size removed.  I discussed the risks, benefits and expected outcomes including bleeding, infection, abscess formation, anastomotic leak, need for drain placement, need for further procedures, possible need for ostomy placement, potentital need for blood products, risks of anesthetic such as heart attack, stroke, blood clots and death.  Patient was given the opportunity to ask questions and have them answer.  He would like to return with his wife and  family to discuss the surgery further prior to scheduling.  Will have him come back in approximately 2 weeks.

## 2015-06-10 NOTE — Telephone Encounter (Signed)
I have called patient and advised him that he will need to come to his appointment on 06/16/15 @ 3:00pm with Dr Azalee Course in [place] to discuss surgery and for consents. Patient was ok with this and will attend his appointment with his family.

## 2015-06-10 NOTE — Telephone Encounter (Signed)
Left a voice message with patient regarding his appointment time.

## 2015-06-10 NOTE — Telephone Encounter (Signed)
Angie, please call patient to schedule his surgery. He had an appointment with Dr Azalee Course on 1/18 and had requested before surgery was scheduled that another appointment be made where the rest of his family could attend and discuss the details of the surgery and to schedule the surgery. He has now decided that he does not want to come back in to do that. He is satisfied with his appointment he had with Dr Azalee Course on 1/18 and would like to move forward to schedule his surgery.

## 2015-06-16 ENCOUNTER — Telehealth: Payer: Self-pay | Admitting: Surgery

## 2015-06-16 ENCOUNTER — Ambulatory Visit (INDEPENDENT_AMBULATORY_CARE_PROVIDER_SITE_OTHER): Payer: PPO | Admitting: Surgery

## 2015-06-16 ENCOUNTER — Encounter: Payer: Self-pay | Admitting: Surgery

## 2015-06-16 VITALS — BP 128/83 | HR 89 | Temp 98.1°F | Wt 188.0 lb

## 2015-06-16 DIAGNOSIS — D122 Benign neoplasm of ascending colon: Secondary | ICD-10-CM

## 2015-06-16 DIAGNOSIS — D121 Benign neoplasm of appendix: Secondary | ICD-10-CM | POA: Diagnosis not present

## 2015-06-16 DIAGNOSIS — D120 Benign neoplasm of cecum: Secondary | ICD-10-CM

## 2015-06-16 NOTE — Progress Notes (Signed)
Subjective:     Patient ID: Stephen Madden, male   DOB: 03-Aug-1950, 65 y.o.   MRN: [id number]  HPI  64 yr old male otherwise healthy who underwent first screening colonoscopy last week. Patient had several large polyps removed from various areas of the colon. Discussed with the patient that since appendiceal polyp not completely removed, and large polyps in ascending colon, would recommend, Laparoscopic right colon resection.  Patient wanted to discuss with his family and return.  He is back today with his wife, daughter and son.  The patient has not noticed any fever, chills, fatigue, weight loss, diarrhea, constipation, melena, blood in stool, change in caliber of stool, or dysuria. He does not have any family history of colon cancers or multiple polyps  Past Medical History  Diagnosis Date  . BPH with obstruction/lower urinary tract symptoms   . Elevated PSA   . Tobacco abuse   . Hypertension    Past Surgical History  Procedure Laterality Date  . Colonoscopy with propofol N/A 05/30/2015    Procedure: COLONOSCOPY WITH PROPOFOL;  Surgeon: Lucilla Lame, MD;  Location: [place];  Service: Endoscopy;  Laterality: N/A;  . Polypectomy  05/30/2015    Procedure: POLYPECTOMY;  Surgeon: Lucilla Lame, MD;  Location: [place];  Service: Endoscopy;;   Family History  Problem Relation Age of Onset  . Heart disease Mother   . Stroke Father   . Cancer Neg Hx   . Kidney disease Neg Hx   . Prostate cancer Neg Hx    Social History   Social History  . Marital Status: Married    Spouse Name: N/A  . Number of Children: N/A  . Years of Education: N/A   Social History Main Topics  . Smoking status: Current Every Day Smoker -- 1.00 packs/day for 30 years    Types: Cigarettes  . Smokeless tobacco: Never Used  . Alcohol Use: 0.0 oz/week    0 Standard drinks or equivalent per week     Comment: occasional  . Drug Use: No  . Sexual Activity: Not Asked   Other Topics Concern  . None    Social History Narrative    Current outpatient prescriptions:  .  atenolol (TENORMIN) 50 MG tablet, Take 50 mg by mouth daily., Disp: , Rfl:  .  CIALIS 20 MG tablet, TAKE 1 TABLET BY MOUTH 1/2 TO 1 HOUR BEFORE SEXUAL ACTIVITY, Disp: 10 tablet, Rfl: 6 .  clotrimazole-betamethasone (LOTRISONE) cream, APPLY TO AFFECTED AREA TWICE A DAY, Disp: , Rfl: 1 .  dutasteride (AVODART) 0.5 MG capsule, Take 1 capsule (0.5 mg total) by mouth daily., Disp: 30 capsule, Rfl: 3 .  NIFEdipine (PROCARDIA-XL/ADALAT CC) 30 MG 24 hr tablet, Take 30 mg by mouth daily., Disp: , Rfl:  .  PROAIR HFA 108 (90 BASE) MCG/ACT inhaler, INHALE 2 PUFFS BY MOUTH EVERY 4 TO 6 HOURS AS NEEDED, Disp: , Rfl: 1 No Known Allergies   Review of Systems  Constitutional: Negative for fever, chills, activity change, appetite change, fatigue and unexpected weight change.  HENT: Negative for congestion and sore throat.   Respiratory: Negative for cough, shortness of breath and wheezing.   Cardiovascular: Negative for chest pain, palpitations and leg swelling.  Gastrointestinal: Negative for nausea, vomiting, abdominal pain, diarrhea, constipation and blood in stool.  Genitourinary: Negative for dysuria and hematuria.  Musculoskeletal: Negative for back pain, gait problem and neck pain.  Skin: Negative for color change, pallor, rash and wound.  Neurological: Negative  for dizziness and weakness.  Hematological: Negative for adenopathy. Does not bruise/bleed easily.  Psychiatric/Behavioral: Negative for confusion. The patient is not nervous/anxious.   All other systems reviewed and are negative.      Objective:   Physical Exam  Constitutional: He is oriented to person, place, and time. He appears well-developed and well-nourished. No distress.  HENT:  Head: Normocephalic and atraumatic.  Right Ear: External ear normal.  Left Ear: External ear normal.  Nose: Nose normal.  Mouth/Throat: Oropharynx is clear and moist. No  oropharyngeal exudate.  Eyes: Conjunctivae are normal. Pupils are equal, round, and reactive to light. No scleral icterus.  Neck: Normal range of motion. Neck supple. No tracheal deviation present.  Cardiovascular: Normal rate, regular rhythm, normal heart sounds and intact distal pulses.  Exam reveals no gallop and no friction rub.   No murmur heard. Pulmonary/Chest: Effort normal and breath sounds normal. No respiratory distress. He has no wheezes. He has no rales.  Abdominal: Soft. Bowel sounds are normal. He exhibits no distension. There is no tenderness. There is no rebound.  Musculoskeletal: Normal range of motion. He exhibits no edema or tenderness.  Neurological: He is alert and oriented to person, place, and time. No cranial nerve deficit.  Skin: Skin is warm and dry. No rash noted. No erythema. No pallor.  Psychiatric: He has a normal mood and affect. His behavior is normal. Judgment and thought content normal.  Vitals reviewed.      Assessment:     65 yr old male with large ascending colon polyps and unremovable appendiceal orifice polyp    Plan:     I discussed with the patient that given the multiple polyps, including >3cm polyp in ascending colon, and serrated polyp at appendiceal orifice that was unable to be completely removed, I would recommend Laparoscopic Right hemicolectomy. I discussed with him that given the large size and type of polyps that he had about a 30% chance that there was a cancer in the area currently that had not been removed. I also explained that he could choose to wait and be screened again in a year, but would not advise this with the type of polyps and size removed. I discussed the risks, benefits and expected outcomes including bleeding, infection, abscess formation, anastomotic leak, need for drain placement, need for further procedures, possible need for ostomy placement, potentital need for blood products, risks of anesthetic such as heart attack,  stroke, blood clots and death. Patient was given the opportunity to ask questions and have them answer.Scheduled for Lap Right hemicolectomy on Feb 20th.

## 2015-06-16 NOTE — Telephone Encounter (Signed)
Pt advised of pre op date/time and sx date office--Bowel prep instructions were given in office and discusses as well. Sx: 07/11/15--with Dr Lenore Manner right colectomy. Pre op:   I will contact [facility] for lighted stents.  Please send Rx to [facility].  I will contact patient as well with the pre op date and time once confirmed.

## 2015-06-17 ENCOUNTER — Other Ambulatory Visit: Payer: Self-pay

## 2015-06-17 ENCOUNTER — Telehealth: Payer: Self-pay | Admitting: Surgery

## 2015-06-17 DIAGNOSIS — D120 Benign neoplasm of cecum: Secondary | ICD-10-CM

## 2015-06-17 MED ORDER — POLYETHYLENE GLYCOL 3350 17 GM/SCOOP PO POWD: 1.0000 | Freq: Once | ORAL | Status: DC

## 2015-06-17 MED ORDER — NEOMYCIN SULFATE 500 MG PO TABS: 1000.0000 mg | ORAL_TABLET | Freq: Three times a day (TID) | ORAL | Status: DC

## 2015-06-17 MED ORDER — ERYTHROMYCIN BASE 500 MG PO TABS: 1000.0000 mg | ORAL_TABLET | Freq: Three times a day (TID) | ORAL | Status: DC

## 2015-06-17 MED ORDER — BISACODYL 5 MG PO TBEC: 20.0000 mg | DELAYED_RELEASE_TABLET | Freq: Once | ORAL | Status: DC

## 2015-06-17 NOTE — Telephone Encounter (Signed)
Pt advised of pre op date/time and sx date. Sx: 07/11/15 with Dr Azalee Course with Dr Genevive Bi assisting--Laparoscopic Right Colectomy. Pre op: 07/04/15 @ 8:15 am--Office.   Dr Erlene Quan will come in at the beginning of case for stent placement.

## 2015-06-17 NOTE — Telephone Encounter (Signed)
Medications sent to pharmacy at this time. ?

## 2015-06-20 DIAGNOSIS — H40003 Preglaucoma, unspecified, bilateral: Secondary | ICD-10-CM | POA: Diagnosis not present

## 2015-06-28 DIAGNOSIS — H40003 Preglaucoma, unspecified, bilateral: Secondary | ICD-10-CM | POA: Diagnosis not present

## 2015-07-04 ENCOUNTER — Ambulatory Visit
Admission: RE | Admit: 2015-07-04 | Discharge: 2015-07-04 | Disposition: A | Discharge status: Home / Self Care | Payer: PPO | Source: Ambulatory Visit | Attending: Surgery | Admitting: Surgery

## 2015-07-04 ENCOUNTER — Encounter
Admission: RE | Admit: 2015-07-04 | Discharge: 2015-07-04 | Disposition: A | Discharge status: Home / Self Care | Payer: PPO | Source: Ambulatory Visit | Attending: Surgery | Admitting: Surgery

## 2015-07-04 DIAGNOSIS — Z01818 Encounter for other preprocedural examination: Secondary | ICD-10-CM

## 2015-07-04 DIAGNOSIS — Z01810 Encounter for preprocedural cardiovascular examination: Secondary | ICD-10-CM | POA: Diagnosis not present

## 2015-07-04 DIAGNOSIS — Z01812 Encounter for preprocedural laboratory examination: Secondary | ICD-10-CM | POA: Diagnosis not present

## 2015-07-04 LAB — CBC WITH DIFFERENTIAL/PLATELET
BASOS ABS: 0 10*3/uL (ref 0–0.1)
BASOS PCT: 1 %
EOS PCT: 7 %
Eosinophils Absolute: 0.4 10*3/uL (ref 0–0.7)
HEMATOCRIT: 44 % (ref 40.0–52.0)
Hemoglobin: 14.6 g/dL (ref 13.0–18.0)
LYMPHS ABS: 1.6 10*3/uL (ref 1.0–3.6)
Lymphocytes Relative: 31 %
MCH: 31.1 pg (ref 26.0–34.0)
MCHC: 33.1 g/dL (ref 32.0–36.0)
MCV: 93.9 fL (ref 80.0–100.0)
MONOS PCT: 9 %
Monocytes Absolute: 0.5 10*3/uL (ref 0.2–1.0)
Neutro Abs: 2.8 10*3/uL (ref 1.4–6.5)
Neutrophils Relative %: 52 %
PLATELETS: 168 10*3/uL (ref 150–440)
RBC: 4.68 MIL/uL (ref 4.40–5.90)
RDW: 13.8 % (ref 11.5–14.5)
WBC: 5.4 10*3/uL (ref 3.8–10.6)

## 2015-07-04 LAB — SURGICAL PCR SCREEN
MRSA, PCR: NEGATIVE
Staphylococcus aureus: NEGATIVE

## 2015-07-04 LAB — COMPREHENSIVE METABOLIC PANEL
ALBUMIN: 4.4 g/dL (ref 3.5–5.0)
ALT: 21 U/L (ref 17–63)
AST: 26 U/L (ref 15–41)
Alkaline Phosphatase: 74 U/L (ref 38–126)
Anion gap: 7 (ref 5–15)
BUN: 13 mg/dL (ref 6–20)
CHLORIDE: 102 mmol/L (ref 101–111)
CO2: 32 mmol/L (ref 22–32)
CREATININE: 1.06 mg/dL (ref 0.61–1.24)
Calcium: 9.4 mg/dL (ref 8.9–10.3)
GFR calc Af Amer: >60 mL/min (ref >60)
GFR calc non Af Amer: >60 mL/min (ref >60)
GLUCOSE: 98 mg/dL (ref 65–99)
POTASSIUM: 4.1 mmol/L (ref 3.5–5.1)
Sodium: 141 mmol/L (ref 135–145)
Total Bilirubin: 1 mg/dL (ref 0.3–1.2)
Total Protein: 7.1 g/dL (ref 6.5–8.1)

## 2015-07-04 NOTE — Patient Instructions (Signed)
  Your procedure is scheduled on: Monday Feb. 20, 2017. Report to Same Day Surgery. To find out your arrival time please call [phone] between 1PM - 3PM on Friday Feb. 17, 2017.  Remember: Instructions that are not followed completely may result in serious medical risk, up to and including death, or upon the discretion of your surgeon and anesthesiologist your surgery may need to be rescheduled.    _x___ 1. Do not eat food or drink liquids after midnight. No gum chewing or hard candies.     _x___ 2. No Alcohol for 24 hours before or after surgery.   ____ 3. Bring all medications with you on the day of surgery if instructed.    __x__ 4. Notify your doctor if there is any change in your medical condition     (cold, fever, infections).     Do not wear jewelry, make-up, hairpins, clips or nail polish.  Do not wear lotions, powders, or perfumes. You may wear deodorant.  Do not shave 48 hours prior to surgery. Men may shave face and neck.  Do not bring valuables to the hospital.    [facility] is not responsible for any belongings or valuables.               Contacts, dentures or bridgework may not be worn into surgery.  Leave your suitcase in the car. After surgery it may be brought to your room.  For patients admitted to the hospital, discharge time is determined by your treatment team.   Patients discharged the day of surgery will not be allowed to drive home.    Please read over the following fact sheets that you were given:   [facility] Preparing for Surgery  _x___ Take these medicines the morning of surgery with A SIP OF WATER:    1. atenolol (TENORMIN)  2. NIFEdipine (PROCARDIA-XL/ADALAT CC)  3. dutasteride (AVODART)    ____ Fleet Enema (as directed)   _x___ Use CHG Soap as directed  _x___ Use inhalers on the day of surgery and bring inhaler to hospital.  ____ Stop metformin 2 days prior to surgery    ____ Take 1/2 of usual insulin dose the night before surgery and  none on the morning of  surgery.   ____ Stop Coumadin/Plavix/aspirin on does not apply  __x__ Stop Anti-inflammatories Aleve, Ibuprofen, Goody's, Advil, Motrin now.  OK to take Tylenol for pain.   ____ Stop supplements until after surgery.    ____ Bring C-Pap to the hospital.

## 2015-07-11 ENCOUNTER — Other Ambulatory Visit: Payer: PRIVATE HEALTH INSURANCE

## 2015-07-11 ENCOUNTER — Inpatient Hospital Stay: Payer: PPO

## 2015-07-11 ENCOUNTER — Inpatient Hospital Stay: Payer: PPO | Admitting: Anesthesiology

## 2015-07-11 ENCOUNTER — Inpatient Hospital Stay
Admission: RE | Admit: 2015-07-11 | Discharge: 2015-07-14 | DRG: 330 | Disposition: A | Discharge status: Home / Self Care | Payer: PPO | Source: Ambulatory Visit | Attending: Surgery | Admitting: Surgery

## 2015-07-11 ENCOUNTER — Encounter: Payer: Self-pay | Admitting: *Deleted

## 2015-07-11 ENCOUNTER — Encounter
Admission: RE | Disposition: A | Discharge status: Home / Self Care | Payer: Self-pay | Source: Ambulatory Visit | Attending: Surgery

## 2015-07-11 DIAGNOSIS — J45901 Unspecified asthma with (acute) exacerbation: Secondary | ICD-10-CM | POA: Diagnosis not present

## 2015-07-11 DIAGNOSIS — F17210 Nicotine dependence, cigarettes, uncomplicated: Secondary | ICD-10-CM | POA: Diagnosis not present

## 2015-07-11 DIAGNOSIS — N179 Acute kidney failure, unspecified: Secondary | ICD-10-CM | POA: Diagnosis not present

## 2015-07-11 DIAGNOSIS — Z823 Family history of stroke: Secondary | ICD-10-CM

## 2015-07-11 DIAGNOSIS — Z8249 Family history of ischemic heart disease and other diseases of the circulatory system: Secondary | ICD-10-CM | POA: Diagnosis not present

## 2015-07-11 DIAGNOSIS — I10 Essential (primary) hypertension: Secondary | ICD-10-CM | POA: Diagnosis present

## 2015-07-11 DIAGNOSIS — K388 Other specified diseases of appendix: Secondary | ICD-10-CM | POA: Diagnosis not present

## 2015-07-11 DIAGNOSIS — Z79899 Other long term (current) drug therapy: Secondary | ICD-10-CM

## 2015-07-11 DIAGNOSIS — N401 Enlarged prostate with lower urinary tract symptoms: Secondary | ICD-10-CM | POA: Diagnosis not present

## 2015-07-11 DIAGNOSIS — D120 Benign neoplasm of cecum: Secondary | ICD-10-CM | POA: Diagnosis not present

## 2015-07-11 DIAGNOSIS — D125 Benign neoplasm of sigmoid colon: Secondary | ICD-10-CM | POA: Diagnosis not present

## 2015-07-11 DIAGNOSIS — Z9049 Acquired absence of other specified parts of digestive tract: Secondary | ICD-10-CM

## 2015-07-11 DIAGNOSIS — D121 Benign neoplasm of appendix: Secondary | ICD-10-CM | POA: Diagnosis present

## 2015-07-11 DIAGNOSIS — K635 Polyp of colon: Secondary | ICD-10-CM | POA: Diagnosis not present

## 2015-07-11 DIAGNOSIS — R972 Elevated prostate specific antigen [PSA]: Secondary | ICD-10-CM | POA: Diagnosis not present

## 2015-07-11 DIAGNOSIS — D122 Benign neoplasm of ascending colon: Secondary | ICD-10-CM | POA: Diagnosis not present

## 2015-07-11 HISTORY — PX: CYSTOSCOPY W/ RETROGRADES: SHX1426

## 2015-07-11 HISTORY — PX: CYSTOSCOPY WITH STENT PLACEMENT: SHX5790

## 2015-07-11 HISTORY — PX: LAPAROSCOPIC PARTIAL COLECTOMY: SHX5907

## 2015-07-11 SURGERY — LAPAROSCOPIC PARTIAL COLECTOMY
Anesthesia: General | Wound class: Clean Contaminated

## 2015-07-11 MED ORDER — DUTASTERIDE 0.5 MG PO CAPS
0.5000 mg | ORAL_CAPSULE | Freq: Every day | ORAL | Status: DC
  Administered 2015-07-13 – 2015-07-14 (×2): 0.5 mg via ORAL
  Filled 2015-07-11 (×4): qty 1

## 2015-07-11 MED ORDER — LACTATED RINGERS IV SOLN
INTRAVENOUS | Status: DC
  Administered 2015-07-11 (×3): via INTRAVENOUS

## 2015-07-11 MED ORDER — HYDRALAZINE HCL 20 MG/ML IJ SOLN: 10.0000 mg | INTRAMUSCULAR | Status: DC | PRN

## 2015-07-11 MED ORDER — FENTANYL CITRATE (PF) 100 MCG/2ML IJ SOLN
INTRAMUSCULAR | Status: DC | PRN
  Administered 2015-07-11: 25 ug via INTRAVENOUS
  Administered 2015-07-11: 100 ug via INTRAVENOUS
  Administered 2015-07-11 (×2): 25 ug via INTRAVENOUS

## 2015-07-11 MED ORDER — MIDAZOLAM HCL 2 MG/2ML IJ SOLN
INTRAMUSCULAR | Status: DC | PRN
  Administered 2015-07-11: 2 mg via INTRAVENOUS

## 2015-07-11 MED ORDER — EPHEDRINE SULFATE 50 MG/ML IJ SOLN
INTRAMUSCULAR | Status: DC | PRN
  Administered 2015-07-11 (×5): 10 mg via INTRAVENOUS

## 2015-07-11 MED ORDER — IOTHALAMATE MEGLUMINE 43 % IV SOLN
INTRAVENOUS | Status: DC | PRN
  Administered 2015-07-11: 13 mL

## 2015-07-11 MED ORDER — ENOXAPARIN SODIUM 40 MG/0.4ML ~~LOC~~ SOLN
40.0000 mg | SUBCUTANEOUS | Status: DC
Start: 2015-07-12 — End: 2015-07-14
  Administered 2015-07-12 – 2015-07-14 (×3): 40 mg via SUBCUTANEOUS
  Filled 2015-07-11 (×3): qty 0.4

## 2015-07-11 MED ORDER — BUPIVACAINE HCL (PF) 0.25 % IJ SOLN
INTRAMUSCULAR | Status: AC
  Filled 2015-07-11: qty 30

## 2015-07-11 MED ORDER — KETOROLAC TROMETHAMINE 15 MG/ML IJ SOLN
15.0000 mg | Freq: Four times a day (QID) | INTRAMUSCULAR | Status: DC
  Administered 2015-07-11 – 2015-07-14 (×12): 15 mg via INTRAVENOUS
  Filled 2015-07-11 (×12): qty 1

## 2015-07-11 MED ORDER — CYCLOBENZAPRINE HCL 10 MG PO TABS
10.0000 mg | ORAL_TABLET | Freq: Three times a day (TID) | ORAL | Status: DC
  Administered 2015-07-11 – 2015-07-14 (×8): 10 mg via ORAL
  Filled 2015-07-11 (×9): qty 1

## 2015-07-11 MED ORDER — ONDANSETRON HCL 4 MG/2ML IJ SOLN
INTRAMUSCULAR | Status: DC | PRN
  Administered 2015-07-11: 4 mg via INTRAVENOUS

## 2015-07-11 MED ORDER — OXYCODONE HCL 5 MG PO TABS
5.0000 mg | ORAL_TABLET | ORAL | Status: DC | PRN
  Administered 2015-07-11: 10 mg via ORAL
  Filled 2015-07-11: qty 2

## 2015-07-11 MED ORDER — ONDANSETRON 8 MG PO TBDP: 4.0000 mg | ORAL_TABLET | Freq: Four times a day (QID) | ORAL | Status: DC | PRN

## 2015-07-11 MED ORDER — FAMOTIDINE 20 MG PO TABS
20.0000 mg | ORAL_TABLET | Freq: Once | ORAL | Status: AC
  Administered 2015-07-11: 20 mg via ORAL

## 2015-07-11 MED ORDER — CHLORHEXIDINE GLUCONATE 4 % EX LIQD: 1.0000 "application " | Freq: Once | CUTANEOUS | Status: DC

## 2015-07-11 MED ORDER — FENTANYL CITRATE (PF) 100 MCG/2ML IJ SOLN
INTRAMUSCULAR | Status: AC
  Administered 2015-07-11: 25 ug via INTRAVENOUS
  Filled 2015-07-11: qty 2

## 2015-07-11 MED ORDER — ONDANSETRON HCL 4 MG/2ML IJ SOLN: 4.0000 mg | Freq: Four times a day (QID) | INTRAMUSCULAR | Status: DC | PRN

## 2015-07-11 MED ORDER — PANTOPRAZOLE SODIUM 40 MG IV SOLR
40.0000 mg | Freq: Every day | INTRAVENOUS | Status: DC
  Administered 2015-07-11 – 2015-07-13 (×3): 40 mg via INTRAVENOUS
  Filled 2015-07-11 (×3): qty 40

## 2015-07-11 MED ORDER — ONDANSETRON HCL 4 MG/2ML IJ SOLN: 4.0000 mg | Freq: Once | INTRAMUSCULAR | Status: DC | PRN

## 2015-07-11 MED ORDER — PROPOFOL 10 MG/ML IV BOLUS
INTRAVENOUS | Status: DC | PRN
  Administered 2015-07-11 (×3): 20 mg via INTRAVENOUS
  Administered 2015-07-11: 150 mg via INTRAVENOUS

## 2015-07-11 MED ORDER — FAMOTIDINE 20 MG PO TABS
ORAL_TABLET | ORAL | Status: AC
  Filled 2015-07-11: qty 1

## 2015-07-11 MED ORDER — ROCURONIUM BROMIDE 100 MG/10ML IV SOLN
INTRAVENOUS | Status: DC | PRN
  Administered 2015-07-11 (×2): 10 mg via INTRAVENOUS
  Administered 2015-07-11: 20 mg via INTRAVENOUS
  Administered 2015-07-11: 50 mg via INTRAVENOUS
  Administered 2015-07-11: 10 mg via INTRAVENOUS
  Administered 2015-07-11: 20 mg via INTRAVENOUS
  Administered 2015-07-11 (×2): 5 mg via INTRAVENOUS

## 2015-07-11 MED ORDER — PHENYLEPHRINE HCL 10 MG/ML IJ SOLN
INTRAMUSCULAR | Status: DC | PRN
  Administered 2015-07-11: 100 ug via INTRAVENOUS
  Administered 2015-07-11: 200 ug via INTRAVENOUS
  Administered 2015-07-11 (×2): 100 ug via INTRAVENOUS
  Administered 2015-07-11: 200 ug via INTRAVENOUS
  Administered 2015-07-11: 100 ug via INTRAVENOUS
  Administered 2015-07-11: 200 ug via INTRAVENOUS

## 2015-07-11 MED ORDER — GLYCOPYRROLATE 0.2 MG/ML IJ SOLN
INTRAMUSCULAR | Status: DC | PRN
  Administered 2015-07-11: .8 mg via INTRAVENOUS

## 2015-07-11 MED ORDER — BUPIVACAINE HCL (PF) 0.25 % IJ SOLN
INTRAMUSCULAR | Status: DC | PRN
  Administered 2015-07-11 (×2): 14 mL

## 2015-07-11 MED ORDER — MORPHINE SULFATE (PF) 2 MG/ML IV SOLN: 2.0000 mg | INTRAVENOUS | Status: DC | PRN

## 2015-07-11 MED ORDER — NEOSTIGMINE METHYLSULFATE 10 MG/10ML IV SOLN
INTRAVENOUS | Status: DC | PRN
Start: 2015-07-11 — End: 2015-07-11
  Administered 2015-07-11: 5 mg via INTRAVENOUS

## 2015-07-11 MED ORDER — LACTATED RINGERS IV BOLUS (SEPSIS)
1000.0000 mL | Freq: Once | INTRAVENOUS | Status: AC
  Administered 2015-07-11: 1000 mL via INTRAVENOUS

## 2015-07-11 MED ORDER — LIDOCAINE HCL (CARDIAC) 20 MG/ML IV SOLN
INTRAVENOUS | Status: DC | PRN
  Administered 2015-07-11: 100 mg via INTRAVENOUS

## 2015-07-11 MED ORDER — ALBUTEROL SULFATE (2.5 MG/3ML) 0.083% IN NEBU
2.5000 mg | INHALATION_SOLUTION | RESPIRATORY_TRACT | Status: DC | PRN
  Administered 2015-07-12: 2.5 mg via RESPIRATORY_TRACT
  Filled 2015-07-11 (×2): qty 3

## 2015-07-11 MED ORDER — FENTANYL CITRATE (PF) 100 MCG/2ML IJ SOLN
25.0000 ug | INTRAMUSCULAR | Status: DC | PRN
  Administered 2015-07-11 (×3): 25 ug via INTRAVENOUS

## 2015-07-11 MED ORDER — NIFEDIPINE ER 30 MG PO TB24
30.0000 mg | ORAL_TABLET | ORAL | Status: DC
  Administered 2015-07-12 – 2015-07-14 (×3): 30 mg via ORAL
  Filled 2015-07-11 (×7): qty 1

## 2015-07-11 MED ORDER — DEXTROSE IN LACTATED RINGERS 5 % IV SOLN
INTRAVENOUS | Status: DC
  Administered 2015-07-11: 22:00:00 via INTRAVENOUS
  Administered 2015-07-11: 125 mL/h via INTRAVENOUS
  Administered 2015-07-12 – 2015-07-14 (×4): via INTRAVENOUS

## 2015-07-11 MED ORDER — CEFOXITIN SODIUM-DEXTROSE 2-2.2 GM-% IV SOLR (PREMIX)
INTRAVENOUS | Status: AC
  Administered 2015-07-11: 2000 mg
  Filled 2015-07-11: qty 50

## 2015-07-11 MED ORDER — CEFOXITIN SODIUM-DEXTROSE 2-2.2 GM-% IV SOLR (PREMIX)
2.0000 g | Freq: Four times a day (QID) | INTRAVENOUS | Status: DC
  Administered 2015-07-11 – 2015-07-13 (×8): 2000 mg via INTRAVENOUS
  Filled 2015-07-11 (×13): qty 50

## 2015-07-11 MED ORDER — ALBUTEROL SULFATE HFA 108 (90 BASE) MCG/ACT IN AERS: 2.0000 | INHALATION_SPRAY | RESPIRATORY_TRACT | Status: DC | PRN

## 2015-07-11 MED ORDER — ATENOLOL 25 MG PO TABS
50.0000 mg | ORAL_TABLET | ORAL | Status: DC
  Administered 2015-07-12 – 2015-07-14 (×3): 50 mg via ORAL
  Filled 2015-07-11 (×3): qty 2

## 2015-07-11 MED ORDER — KETOROLAC TROMETHAMINE 30 MG/ML IJ SOLN
INTRAMUSCULAR | Status: AC
  Administered 2015-07-11: 15 mg
  Filled 2015-07-11: qty 1

## 2015-07-11 MED ORDER — ACETAMINOPHEN 500 MG PO TABS
1000.0000 mg | ORAL_TABLET | Freq: Four times a day (QID) | ORAL | Status: DC
Start: 2015-07-11 — End: 2015-07-14
  Administered 2015-07-11 – 2015-07-14 (×12): 1000 mg via ORAL
  Filled 2015-07-11 (×14): qty 2

## 2015-07-11 MED ORDER — SODIUM CHLORIDE 0.9 % IV SOLN
10000.0000 ug | INTRAVENOUS | Status: DC | PRN
  Administered 2015-07-11: 40 ug/min via INTRAVENOUS

## 2015-07-11 SURGICAL SUPPLY — 84 items
APPLIER CLIP 5 13 M/L LIGAMAX5 (MISCELLANEOUS)
BAG BILE T-TUBES STRL (MISCELLANEOUS) ×8 IMPLANT
BAG DRAIN CYSTO-URO LG1000N (MISCELLANEOUS) ×4 IMPLANT
BLADE SURG SZ10 CARB STEEL (BLADE) IMPLANT
CANISTER SUCT 1200ML W/VALVE (MISCELLANEOUS) ×4 IMPLANT
CATH FOLEY 2WAY  5CC 16FR (CATHETERS) ×2
CATH TRAY 16F METER LATEX (MISCELLANEOUS) ×4 IMPLANT
CATH URETL 5X70 OPEN END (CATHETERS) ×8 IMPLANT
CATH URTH 16FR FL 2W BLN LF (CATHETERS) ×2 IMPLANT
CHLORAPREP W/TINT 26ML (MISCELLANEOUS) ×4 IMPLANT
CLIP APPLIE 5 13 M/L LIGAMAX5 (MISCELLANEOUS) IMPLANT
CONRAY 43 FOR UROLOGY 50M (MISCELLANEOUS) ×4 IMPLANT
COVER LIGHT HANDLE STERIS (MISCELLANEOUS) ×4 IMPLANT
COVER MAYO STAND STRL (DRAPES) ×4 IMPLANT
DRAPE LEGGINS SURG 28X43 STRL (DRAPES) ×8 IMPLANT
DRAPE UNDER BUTTOCK W/FLU (DRAPES) ×4 IMPLANT
DRAPE UTILITY 15X26 TOWEL STRL (DRAPES) ×8 IMPLANT
ELECT CAUTERY BLADE 6.4 (BLADE) ×4 IMPLANT
ELECT REM PT RETURN 9FT ADLT (ELECTROSURGICAL) ×4
ELECTRODE REM PT RTRN 9FT ADLT (ELECTROSURGICAL) ×2 IMPLANT
GLOVE BIO SURGEON STRL SZ 6.5 (GLOVE) ×15 IMPLANT
GLOVE BIO SURGEON STRL SZ7 (GLOVE) ×20 IMPLANT
GLOVE BIO SURGEONS STRL SZ 6.5 (GLOVE) ×5
GLOVE PI ORTHOPRO 6.5 (GLOVE) ×4
GLOVE PI ORTHOPRO STRL 6.5 (GLOVE) ×4 IMPLANT
GOWN STRL REUS W/ TWL LRG LVL3 (GOWN DISPOSABLE) ×16 IMPLANT
GOWN STRL REUS W/ TWL LRG LVL4 (GOWN DISPOSABLE) IMPLANT
GOWN STRL REUS W/TWL LRG LVL3 (GOWN DISPOSABLE) ×16
GOWN STRL REUS W/TWL LRG LVL4 (GOWN DISPOSABLE)
GRASPER SUT TROCAR 14GX15 (MISCELLANEOUS) IMPLANT
HANDLE YANKAUER SUCT BULB TIP (MISCELLANEOUS) ×4 IMPLANT
IRRIGATION STRYKERFLOW (MISCELLANEOUS) ×2 IMPLANT
IRRIGATOR STRYKERFLOW (MISCELLANEOUS) ×4
IV NS 1000ML (IV SOLUTION) ×2
IV NS 1000ML BAXH (IV SOLUTION) ×2 IMPLANT
KIT RM TURNOVER CYSTO AR (KITS) ×8 IMPLANT
LABEL OR SOLS (LABEL) ×4 IMPLANT
LIQUID BAND (GAUZE/BANDAGES/DRESSINGS) ×4 IMPLANT
NDL SAFETY 22GX1.5 (NEEDLE) ×4 IMPLANT
NS IRRIG 500ML POUR BTL (IV SOLUTION) ×4 IMPLANT
PACK COLON CLEAN CLOSURE (MISCELLANEOUS) ×4 IMPLANT
PACK CYSTO AR (MISCELLANEOUS) ×4 IMPLANT
PACK LAP CHOLECYSTECTOMY (MISCELLANEOUS) ×4 IMPLANT
PENCIL ELECTRO HAND CTR (MISCELLANEOUS) ×4 IMPLANT
PREP PVP WINGED SPONGE (MISCELLANEOUS) ×4 IMPLANT
PUMP SINGLE ACTION SAP (PUMP) ×4 IMPLANT
RELOAD PROXIMATE 75MM BLUE (ENDOMECHANICALS) ×4 IMPLANT
RELOAD STAPLER BLUE 60MM (STAPLE) ×4 IMPLANT
RETRACTOR WOUND ALXS 18CM SML (MISCELLANEOUS) ×2 IMPLANT
RTRCTR WOUND ALEXIS O 18CM SML (MISCELLANEOUS) ×4
SEALER TISSUE G2 STRG ARTC 35C (ENDOMECHANICALS) IMPLANT
SENSORWIRE 0.038 NOT ANGLED (WIRE) ×8
SET CYSTO W/LG BORE CLAMP LF (SET/KITS/TRAYS/PACK) ×4 IMPLANT
SHEARS HARMONIC ACE PLUS 36CM (ENDOMECHANICALS) ×4 IMPLANT
SLEEVE ENDOPATH XCEL 5M (ENDOMECHANICALS) ×4 IMPLANT
SOL .9 NS 3000ML IRR  AL (IV SOLUTION) ×2
SOL .9 NS 3000ML IRR UROMATIC (IV SOLUTION) ×2 IMPLANT
SPONGE LAP 18X18 5 PK (GAUZE/BANDAGES/DRESSINGS) ×4 IMPLANT
STAPLE ECHEON FLEX 60 POW ENDO (STAPLE) IMPLANT
STAPLER ECHELON POWERED (MISCELLANEOUS) ×4 IMPLANT
STAPLER PROXIMATE 75MM BLUE (STAPLE) ×4 IMPLANT
STAPLER RELOAD BLUE 60MM (STAPLE) ×8
STAPLER SKIN PROX 35W (STAPLE) IMPLANT
STENT URET 6FRX24 CONTOUR (STENTS) IMPLANT
STENT URET 6FRX26 CONTOUR (STENTS) IMPLANT
SURGILUBE 2OZ TUBE FLIPTOP (MISCELLANEOUS) ×4 IMPLANT
SUT MNCRL 4-0 (SUTURE) ×4
SUT MNCRL 4-0 27XMFL (SUTURE) ×4
SUT MNCRL AB 4-0 PS2 18 (SUTURE) ×4 IMPLANT
SUT NYLON 2-0 (SUTURE) IMPLANT
SUT PDS 2-0 27IN (SUTURE) ×8 IMPLANT
SUT SILK 2 0 SH (SUTURE) ×8 IMPLANT
SUT SILK 3-0 (SUTURE) ×8 IMPLANT
SUT VICRYL 0 AB UR-6 (SUTURE) ×8 IMPLANT
SUTURE MNCRL 4-0 27XMF (SUTURE) ×4 IMPLANT
SYR 20CC LL (SYRINGE) ×4 IMPLANT
SYR BULB IRRIG 60ML STRL (SYRINGE) ×4 IMPLANT
SYRINGE IRR TOOMEY STRL 70CC (SYRINGE) ×4 IMPLANT
TROCAR XCEL 12X100 BLDLESS (ENDOMECHANICALS) ×4 IMPLANT
TROCAR XCEL BLUNT TIP 100MML (ENDOMECHANICALS) ×4 IMPLANT
TROCAR XCEL NON-BLD 5MMX100MML (ENDOMECHANICALS) ×4 IMPLANT
TUBING INSUFFLATOR HEATED (MISCELLANEOUS) ×4 IMPLANT
WATER STERILE IRR 1000ML POUR (IV SOLUTION) ×4 IMPLANT
WIRE SENSOR 0.038 NOT ANGLED (WIRE) ×4 IMPLANT

## 2015-07-11 NOTE — Anesthesia Procedure Notes (Signed)
Procedure Name: Intubation Date/Time: 07/11/2015 7:41 AM Performed by: Lance Muss Pre-anesthesia Checklist: Patient identified, Emergency Drugs available, Suction available and Patient being monitored Oxygen Delivery Method: Circle system utilized Preoxygenation: Pre-oxygenation with 100% oxygen Intubation Type: IV induction Ventilation: Mask ventilation without difficulty Laryngoscope Size: Mac and 4 Grade View: Grade II Tube type: Oral Tube size: 7.5 mm Number of attempts: 1 Airway Equipment and Method: Stylet Placement Confirmation: ETT inserted through vocal cords under direct vision,  positive ETCO2 and breath sounds checked- equal and bilateral Secured at: 24 cm Tube secured with: Tape Dental Injury: Teeth and Oropharynx as per pre-operative assessment

## 2015-07-11 NOTE — Anesthesia Preprocedure Evaluation (Signed)
Anesthesia Evaluation  Patient identified by MRN, date of birth, ID band Patient awake    Reviewed: Allergy & Precautions, NPO status   Airway Mallampati: II       Dental no notable dental hx.    Pulmonary COPD, Current Smoker,     + decreased breath sounds      Cardiovascular Exercise Tolerance: Good hypertension, Pt. on home beta blockers  Rhythm:Regular Rate:Normal     Neuro/Psych    GI/Hepatic negative GI ROS, Neg liver ROS,   Endo/Other  negative endocrine ROS  Renal/GU negative Renal ROS     Musculoskeletal   Abdominal Normal abdominal exam  (+)   Peds  Hematology negative hematology ROS (+)   Anesthesia Other Findings   Reproductive/Obstetrics                             Anesthesia Physical Anesthesia Plan  ASA: II  Anesthesia Plan: General   Post-op Pain Management:    Induction: Intravenous  Airway Management Planned: Oral ETT  Additional Equipment:   Intra-op Plan:   Post-operative Plan: Extubation in OR  Informed Consent: I have reviewed the patients History and Physical, chart, labs and discussed the procedure including the risks, benefits and alternatives for the proposed anesthesia with the patient or authorized representative who has indicated his/her understanding and acceptance.     Plan Discussed with: CRNA  Anesthesia Plan Comments:         Anesthesia Quick Evaluation

## 2015-07-11 NOTE — Transfer of Care (Signed)
Immediate Anesthesia Transfer of Care Note  Patient: Stephen Madden  Procedure(s) Performed: Procedure(s): Laparoscopic right hemicolectomy (N/A) CYSTOSCOPY WITH STENT PLACEMENT (Bilateral) CYSTOSCOPY WITH RETROGRADE PYELOGRAM (Bilateral)  Patient Location: PACU  Anesthesia Type:General  Level of Consciousness: awake  Airway & Oxygen Therapy: Patient Spontanous Breathing and Patient connected to face mask oxygen  Post-op Assessment: Report given to RN and Post -op Vital signs reviewed and stable  Post vital signs: Reviewed and stable  Last Vitals:  Filed Vitals:   07/11/15 0559 07/11/15 1303  BP: 124/85 102/68  Pulse: 89 61  Temp: 36.6 C 36.2 C  Resp: 16 12    Complications: No apparent anesthesia complications

## 2015-07-11 NOTE — H&P (View-Only) (Signed)
Subjective:     Patient ID: Stephen Madden, male   DOB: 03-11-1951, 65 y.o.   MRN: [id number]  HPI  65 yr old male otherwise healthy who underwent first screening colonoscopy last week. Patient had several large polyps removed from various areas of the colon. Discussed with the patient that since appendiceal polyp not completely removed, and large polyps in ascending colon, would recommend, Laparoscopic right colon resection.  Patient wanted to discuss with his family and return.  He is back today with his wife, daughter and son.  The patient has not noticed any fever, chills, fatigue, weight loss, diarrhea, constipation, melena, blood in stool, change in caliber of stool, or dysuria. He does not have any family history of colon cancers or multiple polyps  Past Medical History  Diagnosis Date  . BPH with obstruction/lower urinary tract symptoms   . Elevated PSA   . Tobacco abuse   . Hypertension    Past Surgical History  Procedure Laterality Date  . Colonoscopy with propofol N/A 05/30/2015    Procedure: COLONOSCOPY WITH PROPOFOL;  Surgeon: Lucilla Lame, MD;  Location: [facility];  Service: Endoscopy;  Laterality: N/A;  . Polypectomy  05/30/2015    Procedure: POLYPECTOMY;  Surgeon: Lucilla Lame, MD;  Location: [place] [facility];  Service: Endoscopy;;   Family History  Problem Relation Age of Onset  . Heart disease Mother   . Stroke Father   . Cancer Neg Hx   . Kidney disease Neg Hx   . Prostate cancer Neg Hx    Social History   Social History  . Marital Status: Married    Spouse Name: N/A  . Number of Children: N/A  . Years of Education: N/A   Social History Main Topics  . Smoking status: Current Every Day Smoker -- 1.00 packs/day for 30 years    Types: Cigarettes  . Smokeless tobacco: Never Used  . Alcohol Use: 0.0 oz/week    0 Standard drinks or equivalent per week     Comment: occasional  . Drug Use: No  . Sexual Activity: Not Asked   Other Topics Concern  . None    Social History Narrative    Current outpatient prescriptions:  .  atenolol (TENORMIN) 50 MG tablet, Take 50 mg by mouth daily., Disp: , Rfl:  .  CIALIS 20 MG tablet, TAKE 1 TABLET BY MOUTH 1/2 TO 1 HOUR BEFORE SEXUAL ACTIVITY, Disp: 10 tablet, Rfl: 6 .  clotrimazole-betamethasone (LOTRISONE) cream, APPLY TO AFFECTED AREA TWICE A DAY, Disp: , Rfl: 1 .  dutasteride (AVODART) 0.5 MG capsule, Take 1 capsule (0.5 mg total) by mouth daily., Disp: 30 capsule, Rfl: 3 .  NIFEdipine (PROCARDIA-XL/ADALAT CC) 30 MG 24 hr tablet, Take 30 mg by mouth daily., Disp: , Rfl:  .  PROAIR HFA 108 (90 BASE) MCG/ACT inhaler, INHALE 2 PUFFS BY MOUTH EVERY 4 TO 6 HOURS AS NEEDED, Disp: , Rfl: 1 No Known Allergies   Review of Systems  Constitutional: Negative for fever, chills, activity change, appetite change, fatigue and unexpected weight change.  HENT: Negative for congestion and sore throat.   Respiratory: Negative for cough, shortness of breath and wheezing.   Cardiovascular: Negative for chest pain, palpitations and leg swelling.  Gastrointestinal: Negative for nausea, vomiting, abdominal pain, diarrhea, constipation and blood in stool.  Genitourinary: Negative for dysuria and hematuria.  Musculoskeletal: Negative for back pain, gait problem and neck pain.  Skin: Negative for color change, pallor, rash and wound.  Neurological: Negative  for dizziness and weakness.  Hematological: Negative for adenopathy. Does not bruise/bleed easily.  Psychiatric/Behavioral: Negative for confusion. The patient is not nervous/anxious.   All other systems reviewed and are negative.      Objective:   Physical Exam  Constitutional: He is oriented to person, place, and time. He appears well-developed and well-nourished. No distress.  HENT:  Head: Normocephalic and atraumatic.  Right Ear: External ear normal.  Left Ear: External ear normal.  Nose: Nose normal.  Mouth/Throat: Oropharynx is clear and moist. No  oropharyngeal exudate.  Eyes: Conjunctivae are normal. Pupils are equal, round, and reactive to light. No scleral icterus.  Neck: Normal range of motion. Neck supple. No tracheal deviation present.  Cardiovascular: Normal rate, regular rhythm, normal heart sounds and intact distal pulses.  Exam reveals no gallop and no friction rub.   No murmur heard. Pulmonary/Chest: Effort normal and breath sounds normal. No respiratory distress. He has no wheezes. He has no rales.  Abdominal: Soft. Bowel sounds are normal. He exhibits no distension. There is no tenderness. There is no rebound.  Musculoskeletal: Normal range of motion. He exhibits no edema or tenderness.  Neurological: He is alert and oriented to person, place, and time. No cranial nerve deficit.  Skin: Skin is warm and dry. No rash noted. No erythema. No pallor.  Psychiatric: He has a normal mood and affect. His behavior is normal. Judgment and thought content normal.  Vitals reviewed.      Assessment:     65 yr old male with large ascending colon polyps and unremovable appendiceal orifice polyp    Plan:     I discussed with the patient that given the multiple polyps, including >3cm polyp in ascending colon, and serrated polyp at appendiceal orifice that was unable to be completely removed, I would recommend Laparoscopic Right hemicolectomy. I discussed with him that given the large size and type of polyps that he had about a 30% chance that there was a cancer in the area currently that had not been removed. I also explained that he could choose to wait and be screened again in a year, but would not advise this with the type of polyps and size removed. I discussed the risks, benefits and expected outcomes including bleeding, infection, abscess formation, anastomotic leak, need for drain placement, need for further procedures, possible need for ostomy placement, potentital need for blood products, risks of anesthetic such as heart attack,  stroke, blood clots and death. Patient was given the opportunity to ask questions and have them answer.Scheduled for Lap Right hemicolectomy on Feb 20th.

## 2015-07-11 NOTE — Progress Notes (Signed)
To OR with Bair Paws, thermal cap, sacral dressing, and SCDS in place

## 2015-07-11 NOTE — Op Note (Signed)
Laparoscopic Extended Right Hemicolectomy Procedure Note  Indications: This patient presents for a laparoscopic extended right hemicolectomy for an incompletely removed and evaluated polyp of the appendiceal orifice with several large polyps removed from the ascending colon.  The patient underwent a complete mechanical and antibiotic bowel prep prior to his operation.  Pre-operative Diagnosis: Polyp of the addpendiceal orifice  Post-operative Diagnosis: Polyp of the addpendiceal orifice  Surgeon: Hubbard Robinson   Assistants: Marta Lamas, MD  Anesthesia: General endotracheal anesthesia  ASA Class: 2  Procedure Details  The patient was seen in the Holding Room. The risks, benefits, complications, treatment options, and expected outcomes were discussed with the patient. The possibilities of reaction to medication, pulmonary aspiration, perforation of viscus, bleeding, recurrent infection, finding a normal colon, the need for additional procedures, failure to diagnose a condition, and creating a complication requiring transfusion or operation were discussed with the patient. The patient concurred with the proposed plan, giving informed consent.  The site of surgery properly noted/marked. The patient was taken to the Operating Room, identified as Stephen Madden and the procedure verified as Partial Colectomy. A Time Out was held and the above information confirmed.  The patient was brought to the operating room and placed supine and lithotomy position with arms tucked.  After induction of a general anesthetic, a Foley catheter was inserted after cystography and bilateral stent placement with Dr. Erlene Quan, please see separate operative note for details. The abdomen was prepped and draped in standard fashion.  One-half percent Marcaine with epinephrine was used to anesthetize the skin above the umbilicus. Bovie electrode was used to dissect down through subcutaneous tissue. A cover clamp was then used to  clamp the fascia and elevated up both inferiorly and superiorly and Bovie electrode was used to incise through the fascia and blunt finger dissection is used to enter into the peritoneal cavity. A figure-of-eight suture was placed using a 0 Vicryl. And a 11 mm Hassan trocar was then placed through this area and secured using the previously placed suture. The abdomen was then insufflated not to exceed 15 mmHg patient tolerated this well.      Exploration revealed a normal omentum, colon, small bowel, peritoneum, liver, and stomach.  A left lateral and right inferior 5-mm trocars as well as a 49mm tochar in the epigastrium were then placed after anesthetizing the skin and peritoneum with Marcaine.  The terminal ileum, cecum and hepatic flexure were then mobilized with gentle retraction of the colon in a medial direction with mobilization of the peritoneal reflection with cautery and the harmonic scalpel.  Mobilization of this area was complete to expose the retroperitoneum. The ureter was identified and spared during this dissection.  There was no blood loss during this portion of the procedure.        A 60 mm blue laparoscopic stapler was used to resect through the terminal ileum approximately 4 cm away from the ileocecal valve. The mesentery was dissected free using the harmonic scalpel the advanced hemostasis setting to go through the mesenteric vessels. This was carried up being able to see the ureter through the whole dissection through the ileocolic vessels through this mesentery. There was some adhesions of Gerota's fascia and the kidney originally dissected up with the colon is fashion in the mesentery were resected and the kidney was put back in its proper place. Mobilization of the mesentery continued through the hepatic flexure into the mid transverse colon intersecting through the right colonic artery as well. The  omentum had to be taken off of the transverse colon as well. Of note the duodenum was  also seen and was gently retracted into the retroperitoneum away from the mesentery of colon.    After completing mobilizing, the periumbilical trocar was enlarged, a small skin protector was placed in the incision. The colon was delivered through the incision.  The transverse colon was resected with a linear stapling device. The extended right hemicolectomy specimen was submitted to pathology.        A side-to-end ileocolonic anastomosis was performed through the small anterior incision with the linear stapling device. The mucosa was inspected and found to be hemostatic.  Closure was achieved with the linear stapling device.  The mesentery was then closed using a 2-0 silk suture in a running stitch. The bowel anastomosis was returned and the skin protector was just sitting clamped with a Claiborne Billings. The abdomen was then reinsufflated and inspected. The anastomosis appeared in good position in the mid abdomen with good flow to the area edges were pink. Some omentum was then placed over the top of this. The abdomen was inspected and there was no active bleeding hemostasis had been achieved. The retroperitoneum and the pelvis were minimally irrigated to ensure hemostasis of been achieved.  The instruments and sponges previously used were counted with correct counts. And a clean mayo stand was set up with the closing instruments and drapes. Gowns and gloves of all participating in the case were changed. The abdomen was then redraped with sterile drapes and a sterile electrocautery and suction was also used. The skin incisions were irrigated out. The supraumbilical incision was then closed with a running 2-0 Prolene suture.  The 12-mm trocar incisions were closed with a figure of eight 2-0 Prolene suture. The soft tissue was irrigated and the incisions were closed with 4-0 Monocryl subcuticular closure. Sterile glue was applied to the skin incisions.   Instrument, sponge, and needle counts were correct prior to  abdominal closure and at the conclusion of the case.   Findings: Extended right hemicolectomy with a ileum to transverse colonic anastomosis.    Estimated Blood Loss:  less than 50 mL         Drains: none         Total IV Fluids: 2565ml         Specimens: terminal ileum, Cecum and right colon         Implants: none         Complications:  None; patient tolerated the procedure well.         Disposition: PACU - hemodynamically stable.         Condition: stable

## 2015-07-11 NOTE — Interval H&P Note (Signed)
History and Physical Interval Note:  07/11/2015 7:12 AM  Stephen Madden  has presented today for surgery, with the diagnosis of Large unresectable polyps in right colon, BENIGN NEOPLASM OF SIGMOID COLON  The various methods of treatment have been discussed with the patient and family. After consideration of risks, benefits and other options for treatment, the patient has consented to  Procedure(s): LAPAROSCOPIC PARTIAL COLECTOMY with stent placement (N/A) CYSTOSCOPY WITH STENT PLACEMENT (Bilateral) CYSTOSCOPY WITH RETROGRADE PYELOGRAM (Bilateral) as a surgical intervention .  The patient's history has been reviewed, patient examined, no change in status, stable for surgery.  I have reviewed the patient's chart and labs.  Questions were answered to the patient's satisfaction.     Axelle Szwed L Laniyah Rosenwald

## 2015-07-11 NOTE — Anesthesia Postprocedure Evaluation (Signed)
Anesthesia Post Note  Patient: [name]  Procedure(s) Performed: Procedure(s) (LRB): Laparoscopic right hemicolectomy (N/A) CYSTOSCOPY WITH STENT PLACEMENT (Bilateral) CYSTOSCOPY WITH RETROGRADE PYELOGRAM (Bilateral)  Patient location during evaluation: PACU Anesthesia Type: General Level of consciousness: awake and awake and alert Pain management: satisfactory to patient Vital Signs Assessment: post-procedure vital signs reviewed and stable Respiratory status: nonlabored ventilation Cardiovascular status: stable Anesthetic complications: no    Last Vitals:  Filed Vitals:   07/11/15 0559 07/11/15 1303  BP: 124/85 102/68  Pulse: 89 61  Temp: 36.6 C 36.2 C  Resp: 16 12    Last Pain: There were no vitals filed for this visit.               VAN STAVEREN,Canyon Lohr

## 2015-07-11 NOTE — Op Note (Signed)
Date of procedure: 07/11/2015  Preoperative diagnosis:  1. Colonic polyp  Postoperative diagnosis:  1. Same as above  Procedure: 1. Cystoscopy 2. Bilateral retrograde pyelogram 3. Bilateral ureteral stent placement  Surgeon: Hollice Espy, MD  Anesthesia: General  Complications: None  Intraoperative findings: Normal upper tracts bilaterally  EBL: minimal   Specimens: none  Drains: 5 Fr open ended ureteral cathter x 2, 16 Fr Foley  Indication: Stephen Madden is a 65 y.o. patient with with a colonic polyp undergoing right hemicolectomy. I was asked by general surgery to place preoperative ureteral stents for the purpose of ureteral identification.  After reviewing the management options for treatment, he elected to proceed with the above surgical procedure(s). We have discussed the potential benefits and risks of the procedure, side effects of the proposed treatment, the likelihood of the patient achieving the goals of the procedure, and any potential problems that might occur during the procedure or recuperation. Informed consent has been obtained.  Description of procedure:  The patient was taken to the operating room and general anesthesia was induced.  The patient was placed in the dorsal lithotomy position, prepped and draped in the usual sterile fashion, and preoperative antibiotics were administered. A preoperative time-out was performed.   A 21 French rigid cystoscopy was advanced per urethra into the bladder. Attention was turned to the left ureteral orifice which was cannulated using a sensor wire and a 5 [place] open-ended ureteral catheter just within the UO. Gentle retrograde pouchogram was performed revealing a normal delicate appearing ureter and a decompressed left upper tract collecting system without filling defects. The wire was then advanced all the way up to level of the kidney and the open-ended ureteral catheter was advanced to the proximal ureter without  difficulty. The scope was then removed leaving the open-ended ureteral catheter in place. The left-sided catheter was cut at an angle to identify as the left side. The scope was then replaced back into the bladder attention was turned to the right ureteral orifice. The same exact procedure was performed performing a right retrograde pyelogram which was normal without filling defects, hydronephrosis. The wire was then advanced all the way up to the level of the kidney and the stent was advanced the level of the renal pelvis without difficulty. The wire was then withdrawn and the scope was removed leaving the stent in place. A 16 French Foley catheter was then advanced per urethra into the bladder and the balloon was filled with 10 cc of sterile water. Each of the open-ended ureteral stents were then individually secured to the catheter using a silk tie. IV extension tubing and bowel bags were connected to each of the open-ended ureteral catheters. At this point time, the surgery was turned over to Dr. Azalee Course who completed her portion of the case.   Plan: Patient's left stent may be removed at the end of the case. His right stent may remove removed tomorrow morning. Foley catheter may be removed per the primary team.  Hollice Espy, M.D.

## 2015-07-12 DIAGNOSIS — D121 Benign neoplasm of appendix: Secondary | ICD-10-CM | POA: Diagnosis not present

## 2015-07-12 DIAGNOSIS — N179 Acute kidney failure, unspecified: Secondary | ICD-10-CM | POA: Diagnosis not present

## 2015-07-12 LAB — BASIC METABOLIC PANEL
ANION GAP: 3 — AB (ref 5–15)
BUN: 19 mg/dL (ref 6–20)
CALCIUM: 8.4 mg/dL — AB (ref 8.9–10.3)
CO2: 28 mmol/L (ref 22–32)
Chloride: 109 mmol/L (ref 101–111)
Creatinine, Ser: 1.74 mg/dL — ABNORMAL HIGH (ref 0.61–1.24)
GFR calc Af Amer: 46 mL/min — ABNORMAL LOW (ref >60)
GFR calc non Af Amer: 39 mL/min — ABNORMAL LOW (ref >60)
GLUCOSE: 122 mg/dL — AB (ref 65–99)
Potassium: 4.3 mmol/L (ref 3.5–5.1)
SODIUM: 140 mmol/L (ref 135–145)

## 2015-07-12 LAB — CBC
HEMATOCRIT: 38.4 % — AB (ref 40.0–52.0)
HEMOGLOBIN: 12.9 g/dL — AB (ref 13.0–18.0)
MCH: 31.9 pg (ref 26.0–34.0)
MCHC: 33.5 g/dL (ref 32.0–36.0)
MCV: 95.2 fL (ref 80.0–100.0)
Platelets: 138 10*3/uL — ABNORMAL LOW (ref 150–440)
RBC: 4.04 MIL/uL — ABNORMAL LOW (ref 4.40–5.90)
RDW: 13.4 % (ref 11.5–14.5)
WBC: 8.5 10*3/uL (ref 3.8–10.6)

## 2015-07-12 MED ORDER — ALBUTEROL SULFATE (2.5 MG/3ML) 0.083% IN NEBU
2.5000 mg | INHALATION_SOLUTION | RESPIRATORY_TRACT | Status: DC
  Administered 2015-07-12 – 2015-07-13 (×5): 2.5 mg via RESPIRATORY_TRACT
  Filled 2015-07-12 (×7): qty 3

## 2015-07-12 NOTE — Progress Notes (Addendum)
65 year old postoperative day 1 from a laparoscopic right extended hemicolectomy. Patient states doing well from a pain standpoint that he only has pain when he coughs and moves around. Patient denies passing any flatus and does feel some bloating. Patient has to get up today but was denied having any help doing that by nursing staff. And states that he is at shortness of breath and wheezing since early this a.m. and he has asked for an inhaler but has had to wait quite a few hours and worsening without getting this.  Filed Vitals:   07/12/15 1257 07/12/15 2136  BP: 118/74 110/79  Pulse: 66 62  Temp: 97.7 F (36.5 C) 98.2 F (36.8 C)  Resp: 16 18   I/O last 3 completed shifts: In: 5821.3 [P.O.:1110; I.V.:861.3; Other:450; IV Piggyback:3400] Out: [id number] [Urine:1670; Blood:50] Total I/O In: 224 [I.V.:224] Out: 52 [Urine:725]   PE: Gen: mild distress Res: course wheezing throughout bilateral lungs, almost no air movement in lower lungs bilaterally, audible wheezing before listening with stethescope Cardio: RRR, no murmur Abd: soft, moderately distended, incisions c/d/i, appropriately tender Ext: 2+ pulses no edema  CBC Latest Ref Rng 07/12/2015 07/04/2015  WBC 3.8 - 10.6 K/uL 8.5 5.4  Hemoglobin 13.0 - 18.0 g/dL 12.9(L) 14.6  Hematocrit 40.0 - 52.0 % 38.4(L) 44.0  Platelets 150 - 440 K/uL 138(L) 168   CMP Latest Ref Rng 07/12/2015 07/04/2015  Glucose 65 - 99 mg/dL 122(H) 98  BUN 6 - 20 mg/dL 19 13  Creatinine 0.61 - 1.24 mg/dL 1.74(H) 1.06  Sodium 135 - 145 mmol/L 140 141  Potassium 3.5 - 5.1 mmol/L 4.3 4.1  Chloride 101 - 111 mmol/L 109 102  CO2 22 - 32 mmol/L 28 32  Calcium 8.9 - 10.3 mg/dL 8.4(L) 9.4  Total Protein 6.5 - 8.1 g/dL - 7.1  Total Bilirubin 0.3 - 1.2 mg/dL - 1.0  Alkaline Phos 38 - 126 U/L - 74  AST 15 - 41 U/L - 26  ALT 17 - 63 U/L - 56    A/P: 65 year old postoperative day 1 from a laparoscopic right extended hemicolectomy. Asthma exacerbation: I had ordered  patient to have albuterol nebulizers every 4 hours when necessary shortness of breath and wheezing, he did not get this today so I have changed this to scheduled every 4 hours to see if we can get his breathing to improve. S/p Lap Colectomy:  We will continue with a clear liquid diet is as not passing any flatus at this time. Encourage ambulation and ensured that he had orders for up ad lib. up to chair ambulate in hallway and discussed with nursing staff to make sure that he gets up today after his breathing is improved with the treatment. Acute kidney injury: Right stent was removed today, patient did have good urine output throughout the course of today, he had low urine output during the operation and I think had some hit to his kidneys due to this. Will evaluate his creatinine tomorrow to ensure improving. Appreciate urology help.

## 2015-07-12 NOTE — Progress Notes (Signed)
Urology Consult Follow Up  Subjective: Patient is resting comfortably.  VSS stable.  UOP is improving.  Foley draining red tinged urine.  Right ureteral stent with no drainage.    Anti-infectives: Anti-infectives    Start     Dose/Rate Route Frequency Ordered Stop   07/11/15 0627  cefOXItin (MEFOXIN) 2-2.2 GM-% IVPB    Comments:  Phillips Grout: cabinet override      07/11/15 [id number] 07/11/15 0729   07/11/15 0157  cefOXItin (MEFOXIN) 2 g in dextrose 50 mL IVPB (premix)     2 g 100 mL/hr over 30 Minutes Intravenous 4 times per day 07/11/15 0157        Current Facility-Administered Medications  Medication Dose Route Frequency Provider Last Rate Last Dose  . acetaminophen (TYLENOL) tablet 1,000 mg  1,000 mg Oral 4 times per day Hubbard Robinson, MD   1,000 mg at 07/12/15 [id number]  . albuterol (PROVENTIL) (2.5 MG/3ML) 0.083% nebulizer solution 2.5 mg  2.5 mg Nebulization Q4H PRN Hubbard Robinson, MD      . atenolol (TENORMIN) tablet 50 mg  50 mg Oral BH-q7a Hubbard Robinson, MD   50 mg at 07/12/15 0644  . cefOXItin (MEFOXIN) 2 g in dextrose 50 mL IVPB (premix)  2 g Intravenous 4 times per day Hubbard Robinson, MD   2,000 mg at 07/12/15 [id number]  . cyclobenzaprine (FLEXERIL) tablet 10 mg  10 mg Oral TID Hubbard Robinson, MD   10 mg at 07/11/15 2155  . dextrose 5 % in lactated ringers infusion   Intravenous Continuous Hubbard Robinson, MD 125 mL/hr at 07/12/15 0641    . dutasteride (AVODART) capsule 0.5 mg  0.5 mg Oral Daily Hubbard Robinson, MD      . enoxaparin (LOVENOX) injection 40 mg  40 mg Subcutaneous Q24H Hubbard Robinson, MD      . hydrALAZINE (APRESOLINE) injection 10 mg  10 mg Intravenous Q2H PRN Hubbard Robinson, MD      . ketorolac (TORADOL) 15 MG/ML injection 15 mg  15 mg Intravenous 4 times per day Hubbard Robinson, MD   15 mg at 07/12/15 0644  . morphine 2 MG/ML injection 2 mg  2 mg Intravenous Q2H PRN Hubbard Robinson, MD      . NIFEdipine (PROCARDIA-XL/ADALAT CC) 24  hr tablet 30 mg  30 mg Oral BH-q7a Hubbard Robinson, MD   30 mg at 07/12/15 [id number]  . ondansetron (ZOFRAN-ODT) disintegrating tablet 4 mg  4 mg Oral Q6H PRN Hubbard Robinson, MD       Or  . ondansetron (ZOFRAN) injection 4 mg  4 mg Intravenous Q6H PRN Hubbard Robinson, MD      . oxyCODONE (Oxy IR/ROXICODONE) immediate release tablet 5-10 mg  5-10 mg Oral Q4H PRN Hubbard Robinson, MD   10 mg at 07/11/15 1611  . pantoprazole (PROTONIX) injection 40 mg  40 mg Intravenous QHS Hubbard Robinson, MD   40 mg at 07/11/15 2155     Objective: Vital signs in last 24 hours: Temp:  [95 F (35 C)-98 F (36.7 C)] 97.7 F (36.5 C) (02/21 0616) Pulse Rate:  [56-70] 67 (02/21 0616) Resp:  [8-21] 16 (02/21 0616) BP: (102-124)/(59-96) 117/74 mmHg (02/21 0616) SpO2:  [88 %-100 %] 99 % (02/21 0616)  Intake/Output from previous day: 02/20 0701 - 02/21 0700 In: 5071.3 [P.O.:360; I.V.:861.3; IV Piggyback:3400] Out: 870 [Urine:820; Blood:50] Intake/Output this shift:     Physical Exam Constitutional: Well  nourished. Alert and oriented, No acute distress. HEENT: [place] AT, moist mucus membranes. Trachea midline, no masses. Cardiovascular: No clubbing, cyanosis, or edema. Respiratory: Normal respiratory effort, no increased work of breathing. Skin: No rashes, bruises or suspicious lesions. Lymph: No cervical or inguinal adenopathy. Neurologic: Grossly intact, no focal deficits, moving all 4 extremities. Psychiatric: Normal mood and affect.  Lab Results:   Recent Labs  07/12/15 0513  WBC 8.5  HGB 12.9*  HCT 38.4*  PLT 138*   BMET  Recent Labs  07/12/15 0513  NA 140  K 4.3  CL 109  CO2 28  GLUCOSE 122*  BUN 19  CREATININE 1.74*  CALCIUM 8.4*   PT/INR No results for input(s): LABPROT, INR in the last 72 hours. ABG No results for input(s): PHART, HCO3 in the last 72 hours.  Invalid input(s): PCO2, PO2  Studies/Results: Dg C-arm 1-60 Min-no Report  07/11/2015  CLINICAL  DATA: stent placement C-ARM 1-60 MINUTES Fluoroscopy was utilized by the requesting physician.  No radiographic interpretation.   Procedure Securing suture are cut and ureteral stent is removed without difficulty.   Patient tolerated the procedure well.   Assessment: s/p Procedure(s): Laparoscopic right hemicolectomy CYSTOSCOPY WITH STENT PLACEMENT CYSTOSCOPY WITH RETROGRADE PYELOGRAM Patient underwent cystoscopy with bilateral retrogrades and bilateral ureteral stent placement for preparation for a right hemicolectomy.  Bilateral retrogrades did not demonstrate filling defect.   Left ureteral stent was removed promptly after the hemicolectomy.   Right ureteral stent is removed today.  He is a patient with [facility] and has a history of elevated PSA and BPH with LUTS which we are following.  He has an upcoming appointment with [place] in March 2017.   Plan: right ureteral stent is removed without difficulty; Foley to be managed by primary service.    CC:     LOS: 1 day    [facility] 07/12/2015

## 2015-07-13 ENCOUNTER — Ambulatory Visit: Payer: PRIVATE HEALTH INSURANCE | Admitting: Urology

## 2015-07-13 LAB — BASIC METABOLIC PANEL
ANION GAP: 5 (ref 5–15)
BUN: 17 mg/dL (ref 6–20)
CHLORIDE: 111 mmol/L (ref 101–111)
CO2: 25 mmol/L (ref 22–32)
Calcium: 8.6 mg/dL — ABNORMAL LOW (ref 8.9–10.3)
Creatinine, Ser: 1.63 mg/dL — ABNORMAL HIGH (ref 0.61–1.24)
GFR calc non Af Amer: 43 mL/min — ABNORMAL LOW (ref >60)
GFR, EST AFRICAN AMERICAN: 49 mL/min — AB (ref >60)
Glucose, Bld: 92 mg/dL (ref 65–99)
Potassium: 4.1 mmol/L (ref 3.5–5.1)
Sodium: 141 mmol/L (ref 135–145)

## 2015-07-13 LAB — SURGICAL PATHOLOGY

## 2015-07-13 MED ORDER — ALBUTEROL SULFATE (2.5 MG/3ML) 0.083% IN NEBU
2.5000 mg | INHALATION_SOLUTION | RESPIRATORY_TRACT | Status: DC | PRN
Start: 2015-07-13 — End: 2015-07-14

## 2015-07-13 NOTE — Progress Notes (Signed)
Serum creatinine is improving from 1.74 yesterday to 1.63 today.

## 2015-07-13 NOTE — Progress Notes (Signed)
2 Days Post-Op  Subjective: Patient wants to advance diet is passing gas only and had a bowel movement he's had no nausea or vomiting  Objective: Vital signs in last 24 hours: Temp:  [97.8 F (36.6 C)-98.2 F (36.8 C)] 97.8 F (36.6 C) (02/22 1335) Pulse Rate:  [58-73] 73 (02/22 1335) Resp:  [17-18] 17 (02/22 1335) BP: (110-119)/(72-79) 114/74 mmHg (02/22 1335) SpO2:  [95 %-100 %] 98 % (02/22 1335) Last BM Date: 07/13/15  Intake/Output from previous day: 02/21 0701 - 02/22 0700 In: [id number] [P.O.:750; I.V.:980] Out: 2825 [Urine:2825] Intake/Output this shift: Total I/O In: 479 [P.O.:100; I.V.:379] Out: 1525 [Urine:1525]  Physical exam:  Abdomen is slightly distended slightly tympanitic but nontender wounds are clean  Lab Results: CBC   Recent Labs  07/12/15 0513  WBC 8.5  HGB 12.9*  HCT 38.4*  PLT 138*   BMET  Recent Labs  07/12/15 0513 07/13/15 0621  NA 140 141  K 4.3 4.1  CL 109 111  CO2 28 25  GLUCOSE 122* 92  BUN 19 17  CREATININE 1.74* 1.63*  CALCIUM 8.4* 8.6*   PT/INR No results for input(s): LABPROT, INR in the last 72 hours. ABG No results for input(s): PHART, HCO3 in the last 72 hours.  Invalid input(s): PCO2, PO2  Studies/Results: No results found.  Anti-infectives: Anti-infectives    Start     Dose/Rate Route Frequency Ordered Stop   07/11/15 0627  cefOXItin (MEFOXIN) 2-2.2 GM-% IVPB    Comments:  Phillips Grout: cabinet override      07/11/15 [id number] 07/11/15 0729   07/11/15 0157  cefOXItin (MEFOXIN) 2 g in dextrose 50 mL IVPB (premix)     2 g 100 mL/hr over 30 Minutes Intravenous 4 times per day 07/11/15 0157        Assessment/Plan: s/p Procedure(s): Laparoscopic right hemicolectomy CYSTOSCOPY WITH STENT PLACEMENT CYSTOSCOPY WITH RETROGRADE PYELOGRAM   We'll advance to full liquids today and monitor tolerance  Florene Glen, MD, Allegra Grana  07/13/2015

## 2015-07-13 NOTE — Care Management Important Message (Signed)
Important Message  Patient Details  Name: Stephen Madden MRN: [id number] Date of Birth: 1950/06/11   Medicare Important Message Given:  Yes    Juliann Pulse A Hearl Heikes 07/13/2015, 11:20 AM

## 2015-07-14 MED ORDER — OXYCODONE HCL 5 MG PO TABS: 5.0000 mg | ORAL_TABLET | ORAL | Status: DC | PRN

## 2015-07-14 NOTE — Progress Notes (Signed)
07/14/2015 15:30  Stephen Madden to be D/C'd Home per MD order.  Discussed prescriptions and follow up appointments with the patient. Prescriptions given to patient, medication list explained in detail. Pt verbalized understanding.    Medication List    TAKE these medications        atenolol 50 MG tablet  Commonly known as:  TENORMIN  Take 50 mg by mouth every morning.     CIALIS 20 MG tablet  Generic drug:  tadalafil  TAKE 1 TABLET BY MOUTH 1/2 TO 1 HOUR BEFORE SEXUAL ACTIVITY     clotrimazole-betamethasone cream  Commonly known as:  LOTRISONE  APPLY TO AFFECTED AREA TWICE A DAY     dutasteride 0.5 MG capsule  Commonly known as:  AVODART  Take 1 capsule (0.5 mg total) by mouth daily.     NIFEdipine 30 MG 24 hr tablet  Commonly known as:  PROCARDIA-XL/ADALAT CC  Take 30 mg by mouth every morning.     oxyCODONE 5 MG immediate release tablet  Commonly known as:  Oxy IR/ROXICODONE  Take 1-2 tablets (5-10 mg total) by mouth every 4 (four) hours as needed for moderate pain.     PROAIR HFA 108 (90 Base) MCG/ACT inhaler  Generic drug:  albuterol  INHALE 2 PUFFS BY MOUTH EVERY 4 TO 6 HOURS AS NEEDED        Filed Vitals:   07/14/15 0811 07/14/15 0901  BP: 137/87 137/87  Pulse:  100  Temp:    Resp: 18     Skin clean, dry and intact without evidence of skin break down, no evidence of skin tears noted. IV catheter discontinued intact. Site without signs and symptoms of complications. Dressing and pressure applied. Pt denies pain at this time. No complaints noted.  An After Visit Summary was printed and given to the patient. Patient escorted via [place], and D/C home via private auto.  Dola Argyle

## 2015-07-14 NOTE — Discharge Instructions (Signed)
Regular diet May shower No heavy lifting Resume home medications and oral analgesics

## 2015-07-14 NOTE — Progress Notes (Signed)
3 Days Post-Op  Subjective: Patient feels well and is tolerating a regular diet he wants to be discharged today is passing gas and had a small bowel movement.  Objective: Vital signs in last 24 hours: Temp:  [97.8 F (36.6 C)-97.9 F (36.6 C)] 97.9 F (36.6 C) (02/23 0443) Pulse Rate:  [73-100] 100 (02/23 0901) Resp:  [16-18] 18 (02/23 0811) BP: (114-137)/(71-87) 137/87 mmHg (02/23 0901) SpO2:  [95 %-98 %] 95 % (02/23 0811) Last BM Date: 07/14/15  Intake/Output from previous day: 02/22 0701 - 02/23 0700 In: 2049 [P.O.:300; I.V.:1749] Out: 5825 [Urine:5825] Intake/Output this shift: Total I/O In: -  Out: 125 [Urine:125]  Physical exam:  Wounds are clean abdomen is soft nondistended nontender  Lab Results: CBC   Recent Labs  07/12/15 0513  WBC 8.5  HGB 12.9*  HCT 38.4*  PLT 138*   BMET  Recent Labs  07/12/15 0513 07/13/15 0621  NA 140 141  K 4.3 4.1  CL 109 111  CO2 28 25  GLUCOSE 122* 92  BUN 19 17  CREATININE 1.74* 1.63*  CALCIUM 8.4* 8.6*   PT/INR No results for input(s): LABPROT, INR in the last 72 hours. ABG No results for input(s): PHART, HCO3 in the last 72 hours.  Invalid input(s): PCO2, PO2  Studies/Results: No results found.  Anti-infectives: Anti-infectives    Start     Dose/Rate Route Frequency Ordered Stop   07/11/15 0627  cefOXItin (MEFOXIN) 2-2.2 GM-% IVPB    Comments:  Phillips Grout: cabinet override      07/11/15 0627 07/11/15 0729   07/11/15 0157  cefOXItin (MEFOXIN) 2 g in dextrose 50 mL IVPB (premix)  Status:  Discontinued     2 g 100 mL/hr over 30 Minutes Intravenous 4 times per day 07/11/15 0157 07/13/15 1733      Assessment/Plan: s/p Procedure(s): Laparoscopic right hemicolectomy [place] PYELOGRAM   Patient doing very well recommend discharged today on oral analgesics to follow up with Dr. Azalee Course in 10 days  Florene Glen, MD, FACS  07/14/2015

## 2015-07-15 ENCOUNTER — Encounter: Payer: Self-pay | Admitting: Surgery

## 2015-07-17 ENCOUNTER — Other Ambulatory Visit: Payer: Self-pay

## 2015-07-20 ENCOUNTER — Encounter: Payer: Self-pay | Admitting: General Surgery

## 2015-07-20 ENCOUNTER — Ambulatory Visit (INDEPENDENT_AMBULATORY_CARE_PROVIDER_SITE_OTHER): Payer: PPO | Admitting: General Surgery

## 2015-07-20 VITALS — BP 145/82 | HR 92 | Temp 98.7°F | Ht 77.0 in | Wt 190.2 lb

## 2015-07-20 DIAGNOSIS — Z4889 Encounter for other specified surgical aftercare: Secondary | ICD-10-CM

## 2015-07-20 NOTE — Progress Notes (Signed)
Outpatient Surgical Follow Up  07/20/2015  Stephen Madden is an 65 y.o. male.   Chief Complaint  Patient presents with  . Routine Post Op    Laparoscopic Right Hemicolectomy (07/11/15)- Dr. Azalee Course    HPI:  65 year old male returns to clinic 8 days status post laparoscopic right hemicolectomy. Patient states he's done very well. He denies having any pain. He's been eating well and having bowel function. He states he is having 2 bowel movements a day but is slowly returning to his normal bowel habits. He denies any fevers, chills, nausea, vomiting, diarrhea, constipation. He's been very happy with his surgical care thus far.  Past Medical History  Diagnosis Date  . BPH with obstruction/lower urinary tract symptoms   . Elevated PSA   . Tobacco abuse   . Hypertension     Past Surgical History  Procedure Laterality Date  . Colonoscopy with propofol N/A 05/30/2015    Procedure: COLONOSCOPY WITH PROPOFOL;  Surgeon: Lucilla Lame, MD;  Location: [facility];  Service: Endoscopy;  Laterality: N/A;  . Polypectomy  05/30/2015    Procedure: POLYPECTOMY;  Surgeon: Lucilla Lame, MD;  Location: [facility];  Service: Endoscopy;;  . Laparoscopic partial colectomy N/A 07/11/2015    Procedure: Laparoscopic right hemicolectomy;  Surgeon: Hubbard Robinson, MD;  Location: [facility];  Service: General;  Laterality: N/A;  . Cystoscopy with stent placement Bilateral 07/11/2015    Procedure: CYSTOSCOPY WITH STENT PLACEMENT;  Surgeon: Hollice Espy, MD;  Location: [facility];  Service: Urology;  Laterality: Bilateral;  . Cystoscopy w/ retrogrades Bilateral 07/11/2015    Procedure: CYSTOSCOPY WITH RETROGRADE PYELOGRAM;  Surgeon: Hollice Espy, MD;  Location: [facility];  Service: Urology;  Laterality: Bilateral;    Family History  Problem Relation Age of Onset  . Heart disease Mother   . Stroke Father   . Cancer Neg Hx   . Kidney disease Neg Hx   . Prostate cancer Neg Hx     Social History:   reports that he has been smoking Cigarettes.  He has a 30 pack-year smoking history. He has never used smokeless tobacco. He reports that he drinks alcohol. He reports that he does not use illicit drugs.  Allergies: No Known Allergies  Medications reviewed.    ROS  a multipoint review of systems was completed, all pertinent positives and negatives were documented within the history of present illness and the remainder are negative   BP 145/82 mmHg  Pulse 92  Temp(Src) 98.7 F (37.1 C) (Oral)  Ht 6\' 5"  (1.956 m)  Wt 86.274 kg (190 lb 3.2 oz)  BMI 22.55 kg/m2  Physical Exam   Gen.: No acute distress  chest: Clear to auscultation Heart: Regular rhythm Abdomen soft, nontender, nondistended. Well approximated laparoscopic incision sites without evidence of erythema or drainage.   No results found for this or any previous visit (from the past 48 hour(s)). No results found.  Assessment/Plan:  1. Aftercare following surgery  65 year old male status post laparoscopic right hemicolectomy. Doing very well. Discussed appropriate treatment of his wounds and timeframe for returning to activities. Patient voiced understanding and been very appreciative. He will follow-up in clinic in 2 weeks to see his operative surgeon for continued wound check.     Clayburn Pert, MD FACS General Surgeon  07/20/2015,2:22 PM

## 2015-07-20 NOTE — Patient Instructions (Signed)
Follow-up in 2 weeks with Dr. Azalee Course as scheduled below.

## 2015-07-25 NOTE — Discharge Summary (Signed)
Physician Discharge Summary  Patient ID: Stephen Madden MRN: [id number] DOB/AGE: September 11, 1950 65 y.o.  Admit date: 07/11/2015 Discharge date:07/14/2015 Admission Diagnoses: Tubular adenoma of ascending colon   Discharge Diagnoses:  Principal Problem:   Benign neoplasm of appendix Active Problems:   Benign neoplasm of ascending colon   S/P partial colectomy   Acute kidney injury [facility])   Discharged Condition: good  Hospital Course: 65 yr old s/p Lap colon resection for colon polyps.  Patient did well post op. Pain well controlled, slowly advanced diet, was having BMs and voiding well as well as walking without difficulty.  He was discharged on POD3  Consults: urology- Dr. Erlene Quan   Treatments: surgery: Lap colectomy   Discharge Exam: Blood pressure 137/87, pulse 100, temperature 97.9 F (36.6 C), temperature source Oral, resp. rate 18, height 6' 4.5" (1.943 m), weight 189 lb (85.73 kg), SpO2 95 %. please see my partner Dr. Antionette Char note for physical exam on 07/14/15  Disposition: 01-Home or Self Care     Medication List    TAKE these medications        atenolol 50 MG tablet  Commonly known as:  TENORMIN  Take 50 mg by mouth every morning.     CIALIS 20 MG tablet  Generic drug:  tadalafil  TAKE 1 TABLET BY MOUTH 1/2 TO 1 HOUR BEFORE SEXUAL ACTIVITY     clotrimazole-betamethasone cream  Commonly known as:  LOTRISONE  APPLY TO AFFECTED AREA TWICE A DAY     dutasteride 0.5 MG capsule  Commonly known as:  AVODART  Take 1 capsule (0.5 mg total) by mouth daily.     NIFEdipine 30 MG 24 hr tablet  Commonly known as:  PROCARDIA-XL/ADALAT CC  Take 30 mg by mouth every morning.     PROAIR HFA 108 (90 Base) MCG/ACT inhaler  Generic drug:  albuterol  INHALE 2 PUFFS BY MOUTH EVERY 4 TO 6 HOURS AS NEEDED           Follow-up Information    Follow up with Hubbard Robinson, MD. Go on 07/20/2015.   Specialty:  Surgery   Why:  @11 :00am   For wound re-check   Contact  information:   [address] [place] [address] [phone]       Signed: Hubbard Robinson 07/25/2015, 7:54 AM

## 2015-08-04 ENCOUNTER — Encounter: Payer: Self-pay | Admitting: Surgery

## 2015-08-04 ENCOUNTER — Other Ambulatory Visit: Payer: Self-pay

## 2015-08-05 ENCOUNTER — Ambulatory Visit (INDEPENDENT_AMBULATORY_CARE_PROVIDER_SITE_OTHER): Payer: No Typology Code available for payment source | Admitting: Surgery

## 2015-08-05 ENCOUNTER — Encounter: Payer: Self-pay | Admitting: Surgery

## 2015-08-05 VITALS — BP 163/111 | HR 92 | Temp 98.8°F | Ht 77.0 in | Wt 188.8 lb

## 2015-08-05 DIAGNOSIS — D120 Benign neoplasm of cecum: Secondary | ICD-10-CM

## 2015-08-05 NOTE — Progress Notes (Signed)
65 year old status post laparoscopic right hemicolectomy for large tubular adenoma. And has been doing well postoperatively. Patient states that his appetite is back. Patient states that he hasn't had any pain is no longer taking any pain medicine. Patient states that he is having some diarrhea stools about 3-4 times a day. Is not using anything to help with this.  Filed Vitals:   08/05/15 1037 08/05/15 1038  BP: 188/100 163/111  Pulse: 92   Temp: 98.8 F (37.1 C)    PE:  Gen: NAD  Abd: soft, non-tender, incision c/d/i healing well  A/P:  He is doing very well after a laparoscopic right hemicolectomy. I did discuss the pathology results. Also instructed the patient to take some yogurt to improve normal gut flora and discussed that he can use some Imodium to help slow the bowels down. Also stated that he will have more bowel movements than he had before the surgery but that they should become a little more normal over the next month. Instructed the patient that if he had any questions or concerns to call

## 2015-08-05 NOTE — Patient Instructions (Signed)
Please eat yogurt or take probiotics twice daily.  Use Imodium to help you with your diarrhea.

## 2015-08-08 ENCOUNTER — Other Ambulatory Visit: Payer: PPO

## 2015-08-08 DIAGNOSIS — R748 Abnormal levels of other serum enzymes: Secondary | ICD-10-CM | POA: Diagnosis not present

## 2015-08-08 DIAGNOSIS — R972 Elevated prostate specific antigen [PSA]: Secondary | ICD-10-CM

## 2015-08-09 LAB — PSA: Prostate Specific Ag, Serum: 3.3 ng/mL (ref 0.0–4.0)

## 2015-08-09 NOTE — Telephone Encounter (Signed)
error 

## 2015-08-11 ENCOUNTER — Encounter: Payer: Self-pay | Admitting: Urology

## 2015-08-11 ENCOUNTER — Ambulatory Visit (INDEPENDENT_AMBULATORY_CARE_PROVIDER_SITE_OTHER): Payer: PPO | Admitting: Urology

## 2015-08-11 VITALS — BP 130/87 | HR 76 | Ht 76.0 in | Wt 189.0 lb

## 2015-08-11 DIAGNOSIS — N5089 Other specified disorders of the male genital organs: Secondary | ICD-10-CM

## 2015-08-11 DIAGNOSIS — N509 Disorder of male genital organs, unspecified: Secondary | ICD-10-CM

## 2015-08-11 DIAGNOSIS — R7989 Other specified abnormal findings of blood chemistry: Secondary | ICD-10-CM

## 2015-08-11 DIAGNOSIS — N529 Male erectile dysfunction, unspecified: Secondary | ICD-10-CM

## 2015-08-11 DIAGNOSIS — N528 Other male erectile dysfunction: Secondary | ICD-10-CM

## 2015-08-11 DIAGNOSIS — Z87898 Personal history of other specified conditions: Secondary | ICD-10-CM

## 2015-08-11 DIAGNOSIS — R748 Abnormal levels of other serum enzymes: Secondary | ICD-10-CM | POA: Diagnosis not present

## 2015-08-11 DIAGNOSIS — N401 Enlarged prostate with lower urinary tract symptoms: Secondary | ICD-10-CM

## 2015-08-11 DIAGNOSIS — N138 Other obstructive and reflux uropathy: Secondary | ICD-10-CM

## 2015-08-11 MED ORDER — DUTASTERIDE 0.5 MG PO CAPS: 0.5000 mg | ORAL_CAPSULE | Freq: Every day | ORAL | Status: DC

## 2015-08-11 NOTE — Progress Notes (Signed)
08/11/2015 9:30 AM   Stephen Madden 1950/05/30 [id number]  Referring provider: Cletis Athens, MD [address] [place], [place] [address]  Chief Complaint  Patient presents with  . Benign Prostatic Hypertrophy    6 month follow up    HPI: Patient is a 65 year old African-American male with a history of elevated PSA, erectile dysfunction and BPH with LUTS who presents today for 6 month follow-up.  In the interim, patient had bilateral ureteral stents placed for a right hemicolectomy on 07/11/2015.  They were removed soon after the surgery.    Elevated PSA: Patient has had an elevation of his PSA from 2.8 ng/mL in 2009 and 5.1 ng/mL in 2015. This is a trend less than 0.75 ng/mL a year. He was started on finasteride nine months ago for his BPH with LUTS.  His most recent PSA was 3.3 ng/mL on 08/08/2015.  Erectile dysfunction Patient is not sexually active at this time.  He and his wife are not getting along at this time.    BPH WITH LUTS His IPSS score today is 7, which is mild lower urinary tract symptomatology. He is pleased with his quality life due to his urinary symptoms.  He denies any dysuria, hematuria or suprapubic pain.   He currently taking dutasteride 0.5 mg daily.   He also denies any recent fevers, chills, nausea or vomiting.   He does not have a family history of PCa.      IPSS      08/11/15 0900       International Prostate Symptom Score   How often have you had the sensation of not emptying your bladder? Less than 1 in 5     How often have you had to urinate less than every two hours? Less than 1 in 5 times     How often have you found you stopped and started again several times when you urinated? Less than 1 in 5 times     How often have you found it difficult to postpone urination? Less than 1 in 5 times     How often have you had a weak urinary stream? Less than 1 in 5 times     How often have you had to strain to start urination? Less than 1 in 5 times     How many times did you typically get up at night to urinate? 1 Time     Total IPSS Score 7     Quality of Life due to urinary symptoms   If you were to spend the rest of your life with your urinary condition just the way it is now how would you feel about that? Pleased        Score:  1-7 Mild 8-19 Moderate 20-35 Severe    PMH: Past Medical History  Diagnosis Date  . BPH with obstruction/lower urinary tract symptoms   . Elevated PSA   . Tobacco abuse   . Hypertension     Surgical History: Past Surgical History  Procedure Laterality Date  . Colonoscopy with propofol N/A 05/30/2015    Procedure: COLONOSCOPY WITH PROPOFOL;  Surgeon: Lucilla Lame, MD;  Location: [facility];  Service: Endoscopy;  Laterality: N/A;  . Polypectomy  05/30/2015    Procedure: POLYPECTOMY;  Surgeon: Lucilla Lame, MD;  Location: [facility];  Service: Endoscopy;;  . Laparoscopic partial colectomy N/A 07/11/2015    Procedure: Laparoscopic right hemicolectomy;  Surgeon: Hubbard Robinson, MD;  Location: [facility];  Service:  General;  Laterality: N/A;  . Cystoscopy with stent placement Bilateral 07/11/2015    Procedure: CYSTOSCOPY WITH STENT PLACEMENT;  Surgeon: Hollice Espy, MD;  Location: [facility];  Service: Urology;  Laterality: Bilateral;  . Cystoscopy w/ retrogrades Bilateral 07/11/2015    Procedure: CYSTOSCOPY WITH RETROGRADE PYELOGRAM;  Surgeon: Hollice Espy, MD;  Location: [facility];  Service: Urology;  Laterality: Bilateral;    Home Medications:    Medication List       This list is accurate as of: 08/11/15  9:30 AM.  Always use your most recent med list.               atenolol 50 MG tablet  Commonly known as:  TENORMIN  Take 50 mg by mouth every morning.     CIALIS 20 MG tablet  Generic drug:  tadalafil  TAKE 1 TABLET BY MOUTH 1/2 TO 1 HOUR BEFORE SEXUAL ACTIVITY     clotrimazole-betamethasone cream  Commonly known as:  LOTRISONE  APPLY TO AFFECTED AREA TWICE A DAY      dutasteride 0.5 MG capsule  Commonly known as:  AVODART  Take 1 capsule (0.5 mg total) by mouth daily.     dutasteride 0.5 MG capsule  Commonly known as:  AVODART  Take 1 capsule (0.5 mg total) by mouth daily.     NIFEdipine 30 MG 24 hr tablet  Commonly known as:  PROCARDIA-XL/ADALAT CC  Take 30 mg by mouth every morning.     PROAIR HFA 108 (90 Base) MCG/ACT inhaler  Generic drug:  albuterol  Reported on 08/11/2015        Allergies: No Known Allergies  Family History: Family History  Problem Relation Age of Onset  . Heart disease Mother   . Stroke Father   . Cancer Neg Hx   . Kidney disease Neg Hx   . Prostate cancer Neg Hx     Social History:  reports that he has been smoking Cigarettes.  He has a 30 pack-year smoking history. He has never used smokeless tobacco. He reports that he does not drink alcohol or use illicit drugs.  ROS: UROLOGY Frequent Urination?: No Hard to postpone urination?: No Burning/pain with urination?: No Get up at night to urinate?: No Leakage of urine?: No Urine stream starts and stops?: No Trouble starting stream?: No Do you have to strain to urinate?: No Blood in urine?: No Urinary tract infection?: No Sexually transmitted disease?: No Injury to kidneys or bladder?: No Painful intercourse?: No Weak stream?: No Erection problems?: No Penile pain?: No  Gastrointestinal Nausea?: No Vomiting?: No Indigestion/heartburn?: No Diarrhea?: No Constipation?: No  Constitutional Fever: No Night sweats?: No Weight loss?: No Fatigue?: No  Skin Skin rash/lesions?: No Itching?: No  Eyes Blurred vision?: No Double vision?: No  Ears/Nose/Throat Sore throat?: No Sinus problems?: No  Hematologic/Lymphatic Swollen glands?: No Easy bruising?: No  Cardiovascular Leg swelling?: No Chest pain?: No  Respiratory Cough?: No Shortness of breath?: No  Endocrine Excessive thirst?: No  Musculoskeletal Back pain?: No Joint pain?:  No  Neurological Headaches?: No Dizziness?: No  Psychologic Depression?: No Anxiety?: No  Physical Exam: BP 130/87 mmHg  Pulse 76  Ht 6\' 4"  (1.93 m)  Wt 189 lb (85.73 kg)  BMI 23.02 kg/m2  Constitutional: Well nourished. Alert and oriented, No acute distress. HEENT: [place] AT, moist mucus membranes. Trachea midline, no masses. Cardiovascular: No clubbing, cyanosis, or edema. Respiratory: Normal respiratory effort, no increased work of breathing. GI: Abdomen is soft, non tender,  non distended, no abdominal masses. Liver and spleen not palpable.  No hernias appreciated.  Stool sample for occult testing is not indicated.   GU: No CVA tenderness.  No bladder fullness or masses.  Patient with uncircumcised phallus. Foreskin easily retracted  Urethral meatus is patent.  No penile discharge. No penile lesions or rashes. Scrotum without lesions, cysts, rashes and/or edema.  Testicles are located scrotally and atrophic bilaterally. No masses are appreciated in the testicles. Left epididymis are normal.  Right epidiymis is firm.   Rectal: Patient with  normal sphincter tone. Anus and perineum without scarring or rashes. No rectal masses are appreciated. Prostate is approximately 60 grams, no nodules are appreciated. Seminal vesicles are normal. Skin: No rashes, bruises or suspicious lesions. Lymph: No cervical or inguinal adenopathy. Neurologic: Grossly intact, no focal deficits, moving all 4 extremities. Psychiatric: Normal mood and affect.  Laboratory Data: Lab Results  Component Value Date   WBC 8.5 07/12/2015   HGB 12.9* 07/12/2015   HCT 38.4* 07/12/2015   MCV 95.2 07/12/2015   PLT 138* 07/12/2015    Lab Results  Component Value Date   CREATININE 1.63* 07/13/2015   Lab Results  Component Value Date   AST 26 07/04/2015   Lab Results  Component Value Date   ALT 21 07/04/2015   PSA History: 2.8 ng/mL on 06/12/2007 3.4 ng/mL on 03/31/2008 3.5 ng/mL on 04/06/2009 4.2  ng/mL on 09/05/2010 4.3 ng/mL on 02/26/2011 3.8 ng/mL on 02/05/2012 4.6 ng/mL on 01/27/2013 5.1 ng/mL on 02/09/2014 4.5 ng/mL on 03/26/2014  5.7 ng/mL on 09/25/2014- PSA, free 1.05   4.4 ng/mL on 01/03/2015   3.3 ng/mL on 08/08/2015    Assessment & Plan:    1. Elevated creatinine:   We will recheck the creatinine level today to ensure that it has returned to baseline.  2. Elevated PSA:   Patient's most current PSA was 3.3 ng/mL on 08/08/2015 which is a decrease from 4.4 ng/mL 7 months ago.  We will continue 6 month evaluations with PSA's and exams.    3. Erectile dysfunction:   Patient and his wife are experiencing marital problems at this time.  We will reassess when he returns in 6 months with a SHIM questionnaire and exam.  4. BPH with LUTS:    IPSS score is 7/1.   He will continue the  dutasteride 0.5 mg daily.   A refill is sent to his pharmacy.  He will have his IPSS score, exam and PSA in 6 months.   5. Epididymal mass:   We will obtain a scrotal ultrasound for further investigation of the mass.  I will call the patient with the results.    Return in about 6 months (around 02/11/2016) for IPSS score and exam; I will call the patient with scrotal ultrasound results.  These notes generated with voice recognition software. I apologize for typographical errors.  Zara Council, [facility] [address], [place], [place] [address] [phone]

## 2015-08-12 LAB — BUN+CREAT
BUN/Creatinine Ratio: 18 (ref 10–22)
BUN: 17 mg/dL (ref 8–27)
Creatinine, Ser: 0.95 mg/dL (ref 0.76–1.27)
GFR calc Af Amer: 97 mL/min/{1.73_m2} (ref >59)
GFR calc non Af Amer: 84 mL/min/{1.73_m2} (ref >59)

## 2015-08-12 LAB — SPECIMEN STATUS REPORT

## 2015-08-15 ENCOUNTER — Telehealth: Payer: Self-pay | Admitting: Surgery

## 2015-08-15 NOTE — Telephone Encounter (Signed)
Patients PCP, Cletis Athens,  referred him for a colonoscopy back in December/January. Patient said his PCP wants him to come by for a 3 month follow up, but patient said the appointment is just to see how his colonoscopy went since the PCP is the one who referred him. He would prefer not to go see his PCP, as he thinks it would be a waste of money if all he wants to do is see how the colonoscopy turned out. He is requesting our office call or send his PCP  information on the colonoscopy and then he doesn't have to go back until his 6 month follow up.

## 2015-08-15 NOTE — Telephone Encounter (Signed)
Explained to patient that we do not do a Colonoscopy until 1 year post surgery to allow time to heal unless, he is having an emergency. I have a note in for a patient reminder for this appointment to be scheduled. Patient states that he is doing well, having no problems what so ever, and does not feel that he needs to be seen before that 1 year mark. I will call patient when it is time to schedule 1 year follow-up Colonoscopy.  Op Note, Colonoscopy results, pathology, and office notes have been faxed over to Dr. Lavera Guise [phone] at this time per patient request.

## 2015-08-23 ENCOUNTER — Ambulatory Visit
Admission: RE | Admit: 2015-08-23 | Discharge: 2015-08-23 | Disposition: A | Discharge status: Home / Self Care | Payer: PPO | Source: Ambulatory Visit | Attending: Urology | Admitting: Urology

## 2015-08-23 DIAGNOSIS — N503 Cyst of epididymis: Secondary | ICD-10-CM | POA: Insufficient documentation

## 2015-08-23 DIAGNOSIS — N5089 Other specified disorders of the male genital organs: Secondary | ICD-10-CM

## 2015-08-23 DIAGNOSIS — N509 Disorder of male genital organs, unspecified: Secondary | ICD-10-CM | POA: Diagnosis not present

## 2015-12-28 DIAGNOSIS — H40003 Preglaucoma, unspecified, bilateral: Secondary | ICD-10-CM | POA: Diagnosis not present

## 2016-01-27 ENCOUNTER — Other Ambulatory Visit: Payer: Self-pay

## 2016-01-27 DIAGNOSIS — N400 Enlarged prostate without lower urinary tract symptoms: Secondary | ICD-10-CM

## 2016-02-02 ENCOUNTER — Other Ambulatory Visit: Payer: PPO

## 2016-02-02 DIAGNOSIS — N400 Enlarged prostate without lower urinary tract symptoms: Secondary | ICD-10-CM | POA: Diagnosis not present

## 2016-02-03 LAB — PSA: Prostate Specific Ag, Serum: 3 ng/mL (ref 0.0–4.0)

## 2016-02-06 ENCOUNTER — Encounter: Payer: Self-pay | Admitting: Urology

## 2016-02-06 ENCOUNTER — Ambulatory Visit (INDEPENDENT_AMBULATORY_CARE_PROVIDER_SITE_OTHER): Payer: PPO | Admitting: Urology

## 2016-02-06 VITALS — BP 163/104 | HR 93 | Ht 76.0 in | Wt 182.7 lb

## 2016-02-06 DIAGNOSIS — N138 Other obstructive and reflux uropathy: Secondary | ICD-10-CM

## 2016-02-06 DIAGNOSIS — N401 Enlarged prostate with lower urinary tract symptoms: Secondary | ICD-10-CM

## 2016-02-06 DIAGNOSIS — N529 Male erectile dysfunction, unspecified: Secondary | ICD-10-CM

## 2016-02-06 DIAGNOSIS — Z87898 Personal history of other specified conditions: Secondary | ICD-10-CM | POA: Diagnosis not present

## 2016-02-06 DIAGNOSIS — N528 Other male erectile dysfunction: Secondary | ICD-10-CM

## 2016-02-06 MED ORDER — DUTASTERIDE 0.5 MG PO CAPS: 0.5000 mg | ORAL_CAPSULE | Freq: Every day | ORAL | 4 refills | Status: DC

## 2016-02-06 NOTE — Progress Notes (Signed)
02/06/2016 10:03 AM   Stephen Madden 21-Nov-1950 [id number]  Referring provider: Cletis Athens, MD [address] [place], [place] [address]  Chief Complaint  Patient presents with  . Elevated PSA    HPI: Patient is a 65 year old African-American male with a history of elevated PSA, erectile dysfunction and BPH with LUTS who presents today for 6 month follow-up.  Elevated PSA: Patient has had an elevation of his PSA from 2.8 ng/mL in 2009 to 5.1 ng/mL in 2015. This is a trend less than 0.75 ng/mL a year. He was started on dutasteride one year ago for his BPH with LUTS.  His most recent PSA was 3.0 ng/mL on 02/02/2016.  Erectile dysfunction His SHIM score is 10, which is moderate ED.   His previous SHIM score was 10.  He has been having difficulty with erections for over a year.  His major complaint is he cannot achieve an erection.  His libido is diminished.   His risk factors for ED are age, BPH, HTN, smoking and blood pressure medications.  He denies any painful erections or curvatures with his erections.        SHIM    Row Name 02/06/16 0909         SHIM: Over the last 6 months:   How do you rate your confidence that you could get and keep an erection? Very Low     When you had erections with sexual stimulation, how often were your erections hard enough for penetration (entering your partner)? A Few Times (much less than half the time)     During sexual intercourse, how often were you able to maintain your erection after you had penetrated (entered) your partner? Very Difficult     During sexual intercourse, how difficult was it to maintain your erection to completion of intercourse? Difficult     When you attempted sexual intercourse, how often was it satisfactory for you? Very Difficult       SHIM Total Score   SHIM 10        Score: 1-7 Severe ED 8-11 Moderate ED 12-16 Mild-Moderate ED 17-21 Mild ED 22-25 No ED   BPH WITH LUTS His IPSS score today is 2, which is  mild lower urinary tract symptomatology. He is mixed with his quality life due to his urinary symptoms.  His previous IPSS score 2/3.  His main complaints are incomplete emptying and intermittency.  He denies any dysuria, hematuria or suprapubic pain.   He currently taking dutasteride 0.5 mg daily.  He does not have a family history of PCa.      IPSS    Row Name 02/06/16 0900         International Prostate Symptom Score   How often have you had the sensation of not emptying your bladder? Less than 1 in 5     How often have you had to urinate less than every two hours? Not at All     How often have you found you stopped and started again several times when you urinated? Less than 1 in 5 times     How often have you found it difficult to postpone urination? Not at All     How often have you had a weak urinary stream? Not at All     How often have you had to strain to start urination? Not at All     How many times did you typically get up at night to urinate? None  Total IPSS Score 2       Quality of Life due to urinary symptoms   If you were to spend the rest of your life with your urinary condition just the way it is now how would you feel about that? Mixed        Score:  1-7 Mild 8-19 Moderate 20-35 Severe    PMH: Past Medical History:  Diagnosis Date  . BPH with obstruction/lower urinary tract symptoms   . Elevated PSA   . Hypertension   . Tobacco abuse     Surgical History: Past Surgical History:  Procedure Laterality Date  . COLONOSCOPY WITH PROPOFOL N/A 05/30/2015   Procedure: COLONOSCOPY WITH PROPOFOL;  Surgeon: Lucilla Lame, MD;  Location: [facility];  Service: Endoscopy;  Laterality: N/A;  . CYSTOSCOPY W/ RETROGRADES Bilateral 07/11/2015   Procedure: CYSTOSCOPY WITH RETROGRADE PYELOGRAM;  Surgeon: Hollice Espy, MD;  Location: [facility];  Service: Urology;  Laterality: Bilateral;  . CYSTOSCOPY WITH STENT PLACEMENT Bilateral 07/11/2015   Procedure:  CYSTOSCOPY WITH STENT PLACEMENT;  Surgeon: Hollice Espy, MD;  Location: [facility];  Service: Urology;  Laterality: Bilateral;  . LAPAROSCOPIC PARTIAL COLECTOMY N/A 07/11/2015   Procedure: Laparoscopic right hemicolectomy;  Surgeon: Hubbard Robinson, MD;  Location: [facility];  Service: General;  Laterality: N/A;  . POLYPECTOMY  05/30/2015   Procedure: POLYPECTOMY;  Surgeon: Lucilla Lame, MD;  Location: [facility];  Service: Endoscopy;;    Home Medications:    Medication List       Accurate as of 02/06/16 10:03 AM. Always use your most recent med list.          atenolol 50 MG tablet Commonly known as:  TENORMIN Take 50 mg by mouth every morning.   BREO ELLIPTA 200-25 MCG/INH Aepb Generic drug:  fluticasone furoate-vilanterol INHALE 1 PUFF BY MOUTH DAILY   CIALIS 20 MG tablet Generic drug:  tadalafil TAKE 1 TABLET BY MOUTH 1/2 TO 1 HOUR BEFORE SEXUAL ACTIVITY   clotrimazole-betamethasone cream Commonly known as:  LOTRISONE APPLY TO AFFECTED AREA TWICE A DAY   dutasteride 0.5 MG capsule Commonly known as:  AVODART Take 1 capsule (0.5 mg total) by mouth daily.   NIFEdipine 30 MG 24 hr tablet Commonly known as:  PROCARDIA-XL/ADALAT CC Take 30 mg by mouth every morning.   PROAIR HFA 108 (90 Base) MCG/ACT inhaler Generic drug:  albuterol Reported on 08/11/2015       Allergies: No Known Allergies  Family History: Family History  Problem Relation Age of Onset  . Heart disease Mother   . Stroke Father   . Cancer Neg Hx   . Kidney disease Neg Hx   . Prostate cancer Neg Hx     Social History:  reports that he has been smoking Cigarettes.  He has a 30.00 pack-year smoking history. He has never used smokeless tobacco. He reports that he does not drink alcohol or use drugs.  ROS: UROLOGY Frequent Urination?: No Hard to postpone urination?: No Burning/pain with urination?: No Get up at night to urinate?: No Leakage of urine?: No Urine stream starts and stops?:  No Trouble starting stream?: No Do you have to strain to urinate?: No Blood in urine?: No Urinary tract infection?: No Sexually transmitted disease?: No Injury to kidneys or bladder?: No Painful intercourse?: No Weak stream?: No Erection problems?: No Penile pain?: No  Gastrointestinal Nausea?: No Vomiting?: No Indigestion/heartburn?: No Diarrhea?: No Constipation?: No  Constitutional Fever: No Night sweats?: No Weight loss?: No  Fatigue?: No  Skin Skin rash/lesions?: No Itching?: No  Eyes Blurred vision?: No Double vision?: No  Ears/Nose/Throat Sore throat?: No Sinus problems?: No  Hematologic/Lymphatic Swollen glands?: No Easy bruising?: No  Cardiovascular Leg swelling?: No Chest pain?: No  Respiratory Cough?: No Shortness of breath?: No  Endocrine Excessive thirst?: No  Musculoskeletal Back pain?: No Joint pain?: No  Neurological Headaches?: No Dizziness?: No  Psychologic Depression?: No Anxiety?: No  Physical Exam: BP (!) 163/104 (BP Location: Left Arm, Patient Position: Sitting, Cuff Size: Normal)   Pulse 93   Ht 6\' 4"  (1.93 m)   Wt 182 lb 11.2 oz (82.9 kg)   BMI 22.24 kg/m   Constitutional: Well nourished. Alert and oriented, No acute distress. HEENT: [place] AT, moist mucus membranes. Trachea midline, no masses. Cardiovascular: No clubbing, cyanosis, or edema. Respiratory: Normal respiratory effort, no increased work of breathing. GI: Abdomen is soft, non tender, non distended, no abdominal masses. Liver and spleen not palpable.  No hernias appreciated.  Stool sample for occult testing is not indicated.   GU: No CVA tenderness.  No bladder fullness or masses.  Patient with uncircumcised phallus. Foreskin easily retracted  Urethral meatus is patent.  No penile discharge. No penile lesions or rashes. Scrotum without lesions, cysts, rashes and/or edema.  Testicles are located scrotally and atrophic bilaterally. No masses are appreciated in  the testicles. Left epididymis are normal.  Right epidiymis is firm.   Rectal: Patient with  normal sphincter tone. Anus and perineum without scarring or rashes. No rectal masses are appreciated. Prostate is approximately 60 grams, no nodules are appreciated. Seminal vesicles are normal. Skin: No rashes, bruises or suspicious lesions. Lymph: No cervical or inguinal adenopathy. Neurologic: Grossly intact, no focal deficits, moving all 4 extremities. Psychiatric: Normal mood and affect.  Laboratory Data: Lab Results  Component Value Date   WBC 8.5 07/12/2015   HGB 12.9 (L) 07/12/2015   HCT 38.4 (L) 07/12/2015   MCV 95.2 07/12/2015   PLT 138 (L) 07/12/2015    Lab Results  Component Value Date   CREATININE 0.95 08/08/2015   Lab Results  Component Value Date   AST 26 07/04/2015   Lab Results  Component Value Date   ALT 21 07/04/2015   PSA History: 2.8 ng/mL on 06/12/2007 3.4 ng/mL on 03/31/2008 3.5 ng/mL on 04/06/2009 4.2 ng/mL on 09/05/2010 4.3 ng/mL on 02/26/2011 3.8 ng/mL on 02/05/2012 4.6 ng/mL on 01/27/2013 5.1 ng/mL on 02/09/2014 4.5 ng/mL on 03/26/2014  5.7 ng/mL on 09/25/2014- PSA, free 1.05   4.4 ng/mL on 01/03/2015   3.3 ng/mL on 08/08/2015   3.0 ng/mL on 02/02/2016    Assessment & Plan:    1. Elevated creatinine  - last serum creatinine was 0.95 on 08/08/2015  2. History of elevated PSA:   Patient's most current PSA was 3.0 ng/mL on 09/14//2017.  We will continue 6 month evaluations with PSA's and exams.    3. Erectile dysfunction  - SHIM score is 10  - encouraged to stop smoking as it has a detrimental effect on erections  - has Cialis on hand, has not tried it yet  - RTC in 6 months for SHIM score and exam  4. BPH with LUTS  - IPSS score is 2/3, it is improving  - Continue conservative management, avoiding bladder irritants and timed voiding's  - Continue dutasteride 0.5 mg daily  - RTC in 6 months for IPSS, PSA and exam   5.  Epididymal mass  - scrotal  US noted no GU pathology  Return in about 6 months (around 08/05/2016) for IPSS, SHIM, PSA and exam.  These notes generated with voice recognition software. I apologize for typographical errors.  Zara Council, [facility] [address] [place], [place] [address] [phone]

## 2016-06-25 DIAGNOSIS — H40003 Preglaucoma, unspecified, bilateral: Secondary | ICD-10-CM | POA: Diagnosis not present

## 2016-07-04 DIAGNOSIS — H40003 Preglaucoma, unspecified, bilateral: Secondary | ICD-10-CM | POA: Diagnosis not present

## 2016-07-25 ENCOUNTER — Telehealth: Payer: Self-pay

## 2016-07-25 NOTE — Telephone Encounter (Signed)
Patient is in need of Colonoscopy. He had a Right Hemicolectomy with Dr. Azalee Course on 07/11/15 for a polyp at the appendiceal orifice. This pathology came back a sessile serrated adenoma.  Please schedule for Colonoscopy to recheck patient.

## 2016-07-30 ENCOUNTER — Other Ambulatory Visit: Payer: Self-pay

## 2016-07-30 ENCOUNTER — Other Ambulatory Visit: Payer: PPO

## 2016-07-30 DIAGNOSIS — N401 Enlarged prostate with lower urinary tract symptoms: Secondary | ICD-10-CM

## 2016-07-31 ENCOUNTER — Telehealth: Payer: Self-pay

## 2016-07-31 LAB — BASIC METABOLIC PANEL
BUN / CREAT RATIO: 13 (ref 10–24)
BUN: 14 mg/dL (ref 8–27)
CALCIUM: 9.4 mg/dL (ref 8.6–10.2)
CHLORIDE: 102 mmol/L (ref 96–106)
CO2: 24 mmol/L (ref 18–29)
CREATININE: 1.1 mg/dL (ref 0.76–1.27)
GFR calc Af Amer: 80 mL/min/{1.73_m2} (ref >59)
GFR calc non Af Amer: 70 mL/min/{1.73_m2} (ref >59)
GLUCOSE: 89 mg/dL (ref 65–99)
Potassium: 4.2 mmol/L (ref 3.5–5.2)
Sodium: 146 mmol/L — ABNORMAL HIGH (ref 134–144)

## 2016-07-31 LAB — LIPID PANEL
CHOLESTEROL TOTAL: 194 mg/dL (ref 100–199)
Chol/HDL Ratio: 2.2 ratio units (ref 0.0–5.0)
HDL: 90 mg/dL (ref >39)
LDL Calculated: 89 mg/dL (ref 0–99)
TRIGLYCERIDES: 73 mg/dL (ref 0–149)
VLDL CHOLESTEROL CAL: 15 mg/dL (ref 5–40)

## 2016-07-31 LAB — CBC
HEMOGLOBIN: 15.3 g/dL (ref 13.0–17.7)
Hematocrit: 44.9 % (ref 37.5–51.0)
MCH: 32.1 pg (ref 26.6–33.0)
MCHC: 34.1 g/dL (ref 31.5–35.7)
MCV: 94 fL (ref 79–97)
Platelets: 191 10*3/uL (ref 150–379)
RBC: 4.77 x10E6/uL (ref 4.14–5.80)
RDW: 14.5 % (ref 12.3–15.4)
WBC: 6.5 10*3/uL (ref 3.4–10.8)

## 2016-07-31 LAB — TSH: TSH: 0.71 u[IU]/mL (ref 0.450–4.500)

## 2016-07-31 LAB — PSA: Prostate Specific Ag, Serum: 4.5 ng/mL — ABNORMAL HIGH (ref 0.0–4.0)

## 2016-07-31 NOTE — Telephone Encounter (Signed)
-----   Message from Nori Riis, PA-C sent at 07/31/2016  8:40 AM EDT ----- Please fax his labs to Dr. Jennette Kettle office.

## 2016-07-31 NOTE — Telephone Encounter (Signed)
Labs faxed

## 2016-08-03 ENCOUNTER — Other Ambulatory Visit: Payer: Self-pay

## 2016-08-03 DIAGNOSIS — Z9049 Acquired absence of other specified parts of digestive tract: Secondary | ICD-10-CM

## 2016-08-03 NOTE — Telephone Encounter (Signed)
Pt scheduled for a colonoscopy at [facility] on 08/28/16 with Wohl.   Please precert for hx of right hemicolectomy Z90.49

## 2016-08-05 NOTE — Progress Notes (Signed)
08/06/2016 9:35 AM   Stephen Madden March 14, 1951 [id number]  Referring provider: Cletis Athens, MD [address] [place], [place] [address]  Chief Complaint  Patient presents with  . Benign Prostatic Hypertrophy    6 month follow up  . Elevated PSA    HPI: Patient is a 66 year old African-American male with a history of elevated PSA, erectile dysfunction and BPH with LUTS who presents today for 6 month follow-up.  Elevated PSA: Patient with a PSA of 5.7 in 09/2014.  Started on 5 alpha reductase inhibitor in 2016.  Recent PSA was 4.5 ng/mL on 07/30/2016.  Erectile dysfunction His SHIM score is 7, which is severe ED.   His previous SHIM score was 10.  He has been having difficulty with erections for over two years.  His major complaint is he cannot achieve an erection.  His libido is diminished.   His risk factors for ED are age, BPH, HTN, smoking and blood pressure medications.  He denies any painful erections or curvatures with his erections.        SHIM    Row Name 08/06/16 0905         SHIM: Over the last 6 months:   How do you rate your confidence that you could get and keep an erection? Very Low     When you had erections with sexual stimulation, how often were your erections hard enough for penetration (entering your partner)? A Few Times (much less than half the time)     During sexual intercourse, how often were you able to maintain your erection after you had penetrated (entered) your partner? Extremely Difficult     During sexual intercourse, how difficult was it to maintain your erection to completion of intercourse? Very Difficult     When you attempted sexual intercourse, how often was it satisfactory for you? Extremely Difficult       SHIM Total Score   SHIM 7        Score: 1-7 Severe ED 8-11 Moderate ED 12-16 Mild-Moderate ED 17-21 Mild ED 22-25 No ED   BPH WITH LUTS His IPSS score today is 0, which is no lower urinary tract symptomatology.  He is pleased  with his quality life due to his urinary symptoms.  His previous IPSS score 2/3.  His main complaints are incomplete emptying and intermittency.  He denies any dysuria, hematuria or suprapubic pain.   He stopped his Avodart nine days ago.  He does not have a family history of PCa.      IPSS    Row Name 08/06/16 0900         International Prostate Symptom Score   How often have you had the sensation of not emptying your bladder? Not at All     How often have you had to urinate less than every two hours? Not at All     How often have you found you stopped and started again several times when you urinated? Not at All     How often have you found it difficult to postpone urination? Not at All     How often have you had a weak urinary stream? Not at All     How often have you had to strain to start urination? Not at All     How many times did you typically get up at night to urinate? None     Total IPSS Score 0       Quality of Life due  to urinary symptoms   If you were to spend the rest of your life with your urinary condition just the way it is now how would you feel about that? Pleased        Score:  1-7 Mild 8-19 Moderate 20-35 Severe    PMH: Past Medical History:  Diagnosis Date  . BPH with obstruction/lower urinary tract symptoms   . Elevated PSA   . Hypertension   . Tobacco abuse     Surgical History: Past Surgical History:  Procedure Laterality Date  . COLONOSCOPY WITH PROPOFOL N/A 05/30/2015   Procedure: COLONOSCOPY WITH PROPOFOL;  Surgeon: Lucilla Lame, MD;  Location: [facility];  Service: Endoscopy;  Laterality: N/A;  . CYSTOSCOPY W/ RETROGRADES Bilateral 07/11/2015   Procedure: CYSTOSCOPY WITH RETROGRADE PYELOGRAM;  Surgeon: Hollice Espy, MD;  Location: [facility];  Service: Urology;  Laterality: Bilateral;  . CYSTOSCOPY WITH STENT PLACEMENT Bilateral 07/11/2015   Procedure: CYSTOSCOPY WITH STENT PLACEMENT;  Surgeon: Hollice Espy, MD;  Location: [facility];   Service: Urology;  Laterality: Bilateral;  . LAPAROSCOPIC PARTIAL COLECTOMY N/A 07/11/2015   Procedure: Laparoscopic right hemicolectomy;  Surgeon: Hubbard Robinson, MD;  Location: [facility];  Service: General;  Laterality: N/A;  . POLYPECTOMY  05/30/2015   Procedure: POLYPECTOMY;  Surgeon: Lucilla Lame, MD;  Location: [facility];  Service: Endoscopy;;    Home Medications:  Allergies as of 08/06/2016   No Known Allergies     Medication List       Accurate as of 08/06/16  9:35 AM. Always use your most recent med list.          atenolol 50 MG tablet Commonly known as:  TENORMIN Take 50 mg by mouth every morning.   BREO ELLIPTA 200-25 MCG/INH Aepb Generic drug:  fluticasone furoate-vilanterol INHALE 1 PUFF BY MOUTH DAILY   CIALIS 20 MG tablet Generic drug:  tadalafil TAKE 1 TABLET BY MOUTH 1/2 TO 1 HOUR BEFORE SEXUAL ACTIVITY   clotrimazole-betamethasone cream Commonly known as:  LOTRISONE APPLY TO AFFECTED AREA TWICE A DAY   dutasteride 0.5 MG capsule Commonly known as:  AVODART Take 1 capsule (0.5 mg total) by mouth daily.   NIFEdipine 30 MG 24 hr tablet Commonly known as:  PROCARDIA-XL/ADALAT CC Take 30 mg by mouth every morning.   PROAIR HFA 108 (90 Base) MCG/ACT inhaler Generic drug:  albuterol Reported on 08/11/2015       Allergies: No Known Allergies  Family History: Family History  Problem Relation Age of Onset  . Heart disease Mother   . Stroke Father   . Cancer Neg Hx   . Kidney disease Neg Hx   . Prostate cancer Neg Hx   . Bladder Cancer Neg Hx     Social History:  reports that he has been smoking Cigarettes.  He has a 30.00 pack-year smoking history. He has never used smokeless tobacco. He reports that he drinks alcohol. He reports that he does not use drugs.  ROS: UROLOGY Frequent Urination?: No Hard to postpone urination?: No Burning/pain with urination?: No Get up at night to urinate?: No Leakage of urine?: No Urine stream starts  and stops?: No Trouble starting stream?: No Do you have to strain to urinate?: No Blood in urine?: No Urinary tract infection?: No Sexually transmitted disease?: No Injury to kidneys or bladder?: No Painful intercourse?: No Weak stream?: No Erection problems?: No Penile pain?: No  Gastrointestinal Nausea?: No Vomiting?: No Indigestion/heartburn?: No Diarrhea?: No Constipation?: No  Constitutional  Fever: No Night sweats?: No Weight loss?: No Fatigue?: No  Skin Skin rash/lesions?: No Itching?: Yes  Eyes Blurred vision?: No Double vision?: No  Ears/Nose/Throat Sore throat?: No Sinus problems?: No  Hematologic/Lymphatic Swollen glands?: No Easy bruising?: No  Cardiovascular Leg swelling?: No Chest pain?: No  Respiratory Cough?: No Shortness of breath?: No  Endocrine Excessive thirst?: No  Musculoskeletal Back pain?: No Joint pain?: No  Neurological Headaches?: No Dizziness?: No  Psychologic Depression?: No Anxiety?: No  Physical Exam: BP 131/81   Pulse 97   Ht 6\' 4"  (1.93 m)   Wt 187 lb 1.6 oz (84.9 kg)   BMI 22.77 kg/m   Constitutional: Well nourished. Alert and oriented, No acute distress. HEENT: [place] AT, moist mucus membranes. Trachea midline, no masses. Cardiovascular: No clubbing, cyanosis, or edema. Respiratory: Normal respiratory effort, no increased work of breathing. GI: Abdomen is soft, non tender, non distended, no abdominal masses. Liver and spleen not palpable.  No hernias appreciated.  Stool sample for occult testing is not indicated.   GU: No CVA tenderness.  No bladder fullness or masses.  Patient with uncircumcised phallus. Foreskin easily retracted  Urethral meatus is patent.  No penile discharge. No penile lesions or rashes. Scrotum without lesions, cysts, rashes and/or edema.  Testicles are located scrotally and atrophic bilaterally. No masses are appreciated in the testicles. Left epididymis are normal.  Right epidiymis is  firm.   Rectal: Patient with  normal sphincter tone. Anus and perineum without scarring or rashes. No rectal masses are appreciated. Prostate is approximately 60 grams, no nodules are appreciated. Seminal vesicles are normal. Skin: No rashes, bruises or suspicious lesions. Lymph: No cervical or inguinal adenopathy. Neurologic: Grossly intact, no focal deficits, moving all 4 extremities. Psychiatric: Normal mood and affect.  Laboratory Data: Lab Results  Component Value Date   WBC 6.5 07/30/2016   HGB 12.9 (L) 07/12/2015   HCT 44.9 07/30/2016   MCV 94 07/30/2016   PLT 191 07/30/2016    Lab Results  Component Value Date   CREATININE 1.10 07/30/2016   Lab Results  Component Value Date   AST 26 07/04/2015   Lab Results  Component Value Date   ALT 21 07/04/2015   PSA History: 2.8 ng/mL on 06/12/2007 3.4 ng/mL on 03/31/2008 3.5 ng/mL on 04/06/2009 4.2 ng/mL on 09/05/2010 4.3 ng/mL on 02/26/2011 3.8 ng/mL on 02/05/2012 4.6 ng/mL on 01/27/2013 5.1 ng/mL on 02/09/2014 4.5 ng/mL on 03/26/2014  5.7 ng/mL on 09/25/2014- PSA, free 1.05 - started on 5 alpha reductase inhibitor   4.4 ng/mL on 01/03/2015   3.3 ng/mL on 08/08/2015   3.0 ng/mL on 02/02/2016   4.5 ng/mL on 07/30/2016  Assessment & Plan:    1. Elevated PSA  - current PSA is 4.5  - patient stopped the Avodart nine days ago - will recheck the PSA and add a free and total      2. Erectile dysfunction  - SHIM score is 7, it is worseing  - encouraged to stop smoking as it has a detrimental effect on erections  - has Cialis on hand, has not tried it yet  - RTC in 6 months for SHIM score and exam  3. BPH with LUTS  - IPSS score is 0/1, it is improving  - Continue conservative management, avoiding bladder irritants and timed voiding's  - restart dutasteride 0.5 mg daily  - RTC in 6 months for IPSS, PSA and exam    Return for pending free and total  PSA.  These notes generated with voice  recognition software. I apologize for typographical errors.  Zara Council, [facility] [address], [place], [place] [address] [phone]

## 2016-08-06 ENCOUNTER — Ambulatory Visit: Payer: PPO | Admitting: Urology

## 2016-08-06 ENCOUNTER — Encounter: Payer: Self-pay | Admitting: Urology

## 2016-08-06 VITALS — BP 131/81 | HR 97 | Ht 76.0 in | Wt 187.1 lb

## 2016-08-06 DIAGNOSIS — R972 Elevated prostate specific antigen [PSA]: Secondary | ICD-10-CM | POA: Diagnosis not present

## 2016-08-06 DIAGNOSIS — N138 Other obstructive and reflux uropathy: Secondary | ICD-10-CM | POA: Diagnosis not present

## 2016-08-06 DIAGNOSIS — N529 Male erectile dysfunction, unspecified: Secondary | ICD-10-CM | POA: Diagnosis not present

## 2016-08-06 DIAGNOSIS — N401 Enlarged prostate with lower urinary tract symptoms: Secondary | ICD-10-CM

## 2016-08-07 ENCOUNTER — Telehealth: Payer: Self-pay

## 2016-08-07 DIAGNOSIS — R972 Elevated prostate specific antigen [PSA]: Secondary | ICD-10-CM

## 2016-08-07 LAB — PSA, TOTAL AND FREE
PSA, Free Pct: 19.5 %
PSA, Free: 0.74 ng/mL
Prostate Specific Ag, Serum: 3.8 ng/mL (ref 0.0–4.0)

## 2016-08-07 NOTE — Telephone Encounter (Signed)
LMOM

## 2016-08-07 NOTE — Telephone Encounter (Signed)
-----   Message from Nori Riis, PA-C sent at 08/07/2016  8:40 AM EDT ----- Please notify the patient that his PSA has come down to 3.8.  He has a 23% probability of having prostate cancer at this time.  I would like to retest his PSA in May.

## 2016-08-07 NOTE — Telephone Encounter (Signed)
Pt returned call. Made aware of PSA results and prostate cancer percentage. Pt voiced understanding. Lab appt made for May and orders placed.

## 2016-08-28 ENCOUNTER — Encounter
Admission: RE | Disposition: A | Discharge status: Home / Self Care | Payer: Self-pay | Source: Ambulatory Visit | Attending: Gastroenterology

## 2016-08-28 ENCOUNTER — Encounter: Payer: Self-pay | Admitting: Anesthesiology

## 2016-08-28 ENCOUNTER — Ambulatory Visit: Payer: PPO | Admitting: Anesthesiology

## 2016-08-28 ENCOUNTER — Ambulatory Visit
Admission: RE | Admit: 2016-08-28 | Discharge: 2016-08-28 | Disposition: A | Discharge status: Home / Self Care | Payer: PPO | Source: Ambulatory Visit | Attending: Gastroenterology | Admitting: Gastroenterology

## 2016-08-28 DIAGNOSIS — D127 Benign neoplasm of rectosigmoid junction: Secondary | ICD-10-CM

## 2016-08-28 DIAGNOSIS — K635 Polyp of colon: Secondary | ICD-10-CM

## 2016-08-28 DIAGNOSIS — Z9049 Acquired absence of other specified parts of digestive tract: Secondary | ICD-10-CM

## 2016-08-28 DIAGNOSIS — D124 Benign neoplasm of descending colon: Secondary | ICD-10-CM

## 2016-08-28 DIAGNOSIS — D125 Benign neoplasm of sigmoid colon: Secondary | ICD-10-CM | POA: Diagnosis not present

## 2016-08-28 DIAGNOSIS — D126 Benign neoplasm of colon, unspecified: Secondary | ICD-10-CM | POA: Diagnosis not present

## 2016-08-28 DIAGNOSIS — Z86010 Personal history of colonic polyps: Secondary | ICD-10-CM | POA: Insufficient documentation

## 2016-08-28 DIAGNOSIS — J449 Chronic obstructive pulmonary disease, unspecified: Secondary | ICD-10-CM | POA: Insufficient documentation

## 2016-08-28 DIAGNOSIS — Z1211 Encounter for screening for malignant neoplasm of colon: Secondary | ICD-10-CM | POA: Insufficient documentation

## 2016-08-28 DIAGNOSIS — I10 Essential (primary) hypertension: Secondary | ICD-10-CM | POA: Diagnosis not present

## 2016-08-28 DIAGNOSIS — K621 Rectal polyp: Secondary | ICD-10-CM | POA: Insufficient documentation

## 2016-08-28 DIAGNOSIS — Z79899 Other long term (current) drug therapy: Secondary | ICD-10-CM | POA: Diagnosis not present

## 2016-08-28 DIAGNOSIS — F17210 Nicotine dependence, cigarettes, uncomplicated: Secondary | ICD-10-CM | POA: Diagnosis not present

## 2016-08-28 HISTORY — PX: COLONOSCOPY WITH PROPOFOL: SHX5780

## 2016-08-28 SURGERY — COLONOSCOPY WITH PROPOFOL
Anesthesia: General

## 2016-08-28 MED ORDER — MIDAZOLAM HCL 2 MG/2ML IJ SOLN
INTRAMUSCULAR | Status: AC
  Filled 2016-08-28: qty 2

## 2016-08-28 MED ORDER — PHENYLEPHRINE HCL 10 MG/ML IJ SOLN
INTRAMUSCULAR | Status: DC | PRN
  Administered 2016-08-28 (×3): 100 ug via INTRAVENOUS

## 2016-08-28 MED ORDER — PROPOFOL 500 MG/50ML IV EMUL
INTRAVENOUS | Status: AC
  Filled 2016-08-28: qty 50

## 2016-08-28 MED ORDER — PROPOFOL 500 MG/50ML IV EMUL
INTRAVENOUS | Status: DC | PRN
  Administered 2016-08-28: 150 ug/kg/min via INTRAVENOUS

## 2016-08-28 MED ORDER — SODIUM CHLORIDE 0.9 % IV SOLN
INTRAVENOUS | Status: DC
  Administered 2016-08-28: 08:00:00 via INTRAVENOUS

## 2016-08-28 MED ORDER — MIDAZOLAM HCL 2 MG/2ML IJ SOLN
INTRAMUSCULAR | Status: DC | PRN
  Administered 2016-08-28: 2 mg via INTRAVENOUS

## 2016-08-28 MED ORDER — PROPOFOL 10 MG/ML IV BOLUS
INTRAVENOUS | Status: DC | PRN
  Administered 2016-08-28: 90 mg via INTRAVENOUS

## 2016-08-28 MED ORDER — PHENYLEPHRINE HCL 10 MG/ML IJ SOLN
INTRAMUSCULAR | Status: AC
  Filled 2016-08-28: qty 1

## 2016-08-28 NOTE — Anesthesia Preprocedure Evaluation (Signed)
Anesthesia Evaluation  Patient identified by MRN, date of birth, ID band Patient awake    Reviewed: Allergy & Precautions, H&P , NPO status , Patient's Chart, lab work & pertinent test results  History of Anesthesia Complications Negative for: history of anesthetic complications  Airway Mallampati: II  TM Distance: >3 FB Neck ROM: limited    Dental  (+) Poor Dentition, Chipped, Missing, Caps   Pulmonary neg shortness of breath, COPD, Current Smoker,    Pulmonary exam normal breath sounds clear to auscultation       Cardiovascular Exercise Tolerance: Good hypertension, (-) angina(-) Past MI and (-) DOE Normal cardiovascular exam Rhythm:regular Rate:Normal     Neuro/Psych negative neurological ROS  negative psych ROS   GI/Hepatic negative GI ROS, Neg liver ROS, neg GERD  ,  Endo/Other  negative endocrine ROS  Renal/GU Renal disease  negative genitourinary   Musculoskeletal   Abdominal   Peds  Hematology negative hematology ROS (+)   Anesthesia Other Findings Past Medical History: No date: BPH with obstruction/lower urinary tract sympt* No date: Elevated PSA No date: Hypertension No date: Tobacco abuse  Past Surgical History: 05/30/2015: COLONOSCOPY WITH PROPOFOL N/A     Comment: Procedure: COLONOSCOPY WITH PROPOFOL;                Surgeon: Lucilla Lame, MD;  Location: [facility];  Service: Endoscopy;  Laterality:              N/A; 07/11/2015: CYSTOSCOPY W/ RETROGRADES Bilateral     Comment: Procedure: CYSTOSCOPY WITH RETROGRADE               PYELOGRAM;  Surgeon: Hollice Espy, MD;                Location: [facility];  Service: Urology;                Laterality: Bilateral; 07/11/2015: CYSTOSCOPY WITH STENT PLACEMENT Bilateral     Comment: Procedure: CYSTOSCOPY WITH STENT PLACEMENT;                Surgeon: Hollice Espy, MD;  Location: [facility];  Service: Urology;   Laterality: Bilateral; 07/11/2015: LAPAROSCOPIC PARTIAL COLECTOMY N/A     Comment: Procedure: Laparoscopic right hemicolectomy;                Surgeon: Hubbard Robinson, MD;  Location:               [facility];  Service: General;  Laterality: N/A; 05/30/2015: POLYPECTOMY     Comment: Procedure: POLYPECTOMY;  Surgeon: Lucilla Lame,              MD;  Location: [facility];  Service:               Endoscopy;;  BMI    Body Mass Index:  22.71 kg/m      Reproductive/Obstetrics negative OB ROS                             Anesthesia Physical Anesthesia Plan  ASA: III  Anesthesia Plan: General   Post-op Pain Management:    Induction:   Airway Management Planned:   Additional Equipment:   Intra-op Plan:   Post-operative Plan:   Informed Consent: I have reviewed the patients  History and Physical, chart, labs and discussed the procedure including the risks, benefits and alternatives for the proposed anesthesia with the patient or authorized representative who has indicated his/her understanding and acceptance.   Dental Advisory Given  Plan Discussed with: Anesthesiologist, CRNA and Surgeon  Anesthesia Plan Comments:         Anesthesia Quick Evaluation

## 2016-08-28 NOTE — Op Note (Signed)
[facility] Gastroenterology Patient Name: Stephen Madden Procedure Date: 08/28/2016 8:03 AM MRN: [id number] Account #: [id number] Date of Birth: 01/26/1951 Admit Type: Outpatient Age: 66 Room: [facility] ENDO ROOM 4 Gender: Male Note Status: Finalized Procedure:            Colonoscopy Indications:          High risk colon cancer surveillance: Personal history                        of adenoma (10 mm or greater in size) Providers:            Lucilla Lame MD, MD Referring MD:         Cletis Athens, MD (Referring MD) Medicines:            Propofol per Anesthesia Complications:        No immediate complications. Procedure:            Pre-Anesthesia Assessment:                       - Prior to the procedure, a History and Physical was                        performed, and patient medications and allergies were                        reviewed. The patient's tolerance of previous                        anesthesia was also reviewed. The risks and benefits of                        the procedure and the sedation options and risks were                        discussed with the patient. All questions were                        answered, and informed consent was obtained. Prior                        Anticoagulants: The patient has taken no previous                        anticoagulant or antiplatelet agents. ASA Grade                        Assessment: II - A patient with mild systemic disease.                        After reviewing the risks and benefits, the patient was                        deemed in satisfactory condition to undergo the                        procedure.                       After obtaining informed consent, the colonoscope was  passed under direct vision. Throughout the procedure,                        the patient's blood pressure, pulse, and oxygen                        saturations were monitored continuously. The   Colonoscope was introduced through the anus and                        advanced to the the transverse colon to examine an                        anastomosis. This was the intended extent. The                        colonoscopy was performed without difficulty. The                        patient tolerated the procedure well. The quality of                        the bowel preparation was excellent. Findings:      The perianal and digital rectal examinations were normal.      Multiple sessile polyps were found in the rectum, recto-sigmoid colon,       descending colon and transverse colon. The polyps were 2 to 10 mm in       size. These polyps were removed with a hot snare. Resection and       retrieval were complete.      Many sessile polyps were found in the rectum, sigmoid colon, descending       colon and transverse colon. The polyps were 2 to 3 mm in size. These       polyps were removed with a cold biopsy forceps. Resection and retrieval       were complete.      A 9 mm polyp was found in the sigmoid colon. The polyp was sessile. The       polyp was removed with a hot snare. Resection and retrieval were       complete. Impression:           - Multiple 2 to 10 mm polyps in the rectum, at the                        recto-sigmoid colon, in the descending colon and in the                        transverse colon, removed with a hot snare. Resected                        and retrieved.                       - Many 2 to 3 mm polyps in the rectum, in the sigmoid                        colon, in the descending colon and in the transverse  colon, removed with a cold biopsy forceps. Resected and                        retrieved.                       - One 9 mm polyp in the sigmoid colon, removed with a                        hot snare. Resected and retrieved. Recommendation:       - Discharge patient to home.                       - Resume previous diet.                        - Continue present medications.                       - Await pathology results.                       - Repeat colonoscopy in 3 years for surveillance. Procedure Code(s):    --- Professional ---                       [phone], 61, Colonoscopy, flexible; with removal of                        tumor(s), polyp(s), or other lesion(s) by snare                        technique                       45380, 59,52, Colonoscopy, flexible; with biopsy,                        single or multiple Diagnosis Code(s):    --- Professional ---                       Z86.010, Personal history of colonic polyps                       D12.7, Benign neoplasm of rectosigmoid junction                       K62.1, Rectal polyp                       D12.5, Benign neoplasm of sigmoid colon                       D12.4, Benign neoplasm of descending colon                       D12.3, Benign neoplasm of transverse colon (hepatic                        flexure or splenic flexure) CPT copyright 2016 American Medical Association. All rights reserved. The codes documented in this report are preliminary and upon coder review may  be revised to meet current compliance requirements. Lucilla Lame MD, MD 08/28/2016 8:37:36 AM This  report has been signed electronically. Number of Addenda: 0 Note Initiated On: 08/28/2016 8:03 AM Scope Withdrawal Time: 0 hours 20 minutes 37 seconds  Total Procedure Duration: 0 hours 24 minutes 43 seconds       [facility]

## 2016-08-28 NOTE — H&P (Signed)
Lucilla Lame, MD [address] [place], [place] [address] Phone:[phone] Fax : [phone]  Primary Care Physician:  Cletis Athens, MD Primary Gastroenterologist:  Dr. Allen Norris  Pre-Procedure History & Physical: HPI:  Stephen Madden is a 66 y.o. male is here for an colonoscopy.   Past Medical History:  Diagnosis Date  . BPH with obstruction/lower urinary tract symptoms   . Elevated PSA   . Hypertension   . Tobacco abuse     Past Surgical History:  Procedure Laterality Date  . COLONOSCOPY WITH PROPOFOL N/A 05/30/2015   Procedure: COLONOSCOPY WITH PROPOFOL;  Surgeon: Lucilla Lame, MD;  Location: [facility];  Service: Endoscopy;  Laterality: N/A;  . CYSTOSCOPY W/ RETROGRADES Bilateral 07/11/2015   Procedure: CYSTOSCOPY WITH RETROGRADE PYELOGRAM;  Surgeon: Hollice Espy, MD;  Location: [facility];  Service: Urology;  Laterality: Bilateral;  . CYSTOSCOPY WITH STENT PLACEMENT Bilateral 07/11/2015   Procedure: CYSTOSCOPY WITH STENT PLACEMENT;  Surgeon: Hollice Espy, MD;  Location: [facility];  Service: Urology;  Laterality: Bilateral;  . LAPAROSCOPIC PARTIAL COLECTOMY N/A 07/11/2015   Procedure: Laparoscopic right hemicolectomy;  Surgeon: Hubbard Robinson, MD;  Location: [facility];  Service: General;  Laterality: N/A;  . POLYPECTOMY  05/30/2015   Procedure: POLYPECTOMY;  Surgeon: Lucilla Lame, MD;  Location: [facility];  Service: Endoscopy;;    Prior to Admission medications   Medication Sig Start Date End Date Taking? Authorizing Provider  atenolol (TENORMIN) 50 MG tablet Take 50 mg by mouth every morning.    Yes Historical Provider, MD  BREO ELLIPTA 200-25 MCG/INH AEPB INHALE 1 PUFF BY MOUTH DAILY 01/30/16  Yes Historical Provider, MD  clotrimazole-betamethasone (LOTRISONE) cream APPLY TO AFFECTED AREA TWICE A DAY 12/14/14  Yes Historical Provider, MD  dutasteride (AVODART) 0.5 MG capsule Take 1 capsule (0.5 mg total) by mouth daily. 02/06/16  Yes Shannon A  McGowan, PA-C  NIFEdipine (PROCARDIA-XL/ADALAT CC) 30 MG 24 hr tablet Take 30 mg by mouth every morning.    Yes Historical Provider, MD  PROAIR HFA 108 (90 BASE) MCG/ACT inhaler Reported on 08/11/2015 12/25/14  Yes Historical Provider, MD  CIALIS 20 MG tablet TAKE 1 TABLET BY MOUTH 1/2 TO 1 HOUR BEFORE SEXUAL ACTIVITY Patient not taking: Reported on 02/06/2016 01/10/15   Nori Riis, PA-C    Allergies as of 08/03/2016  . (No Known Allergies)    Family History  Problem Relation Age of Onset  . Heart disease Mother   . Stroke Father   . Cancer Neg Hx   . Kidney disease Neg Hx   . Prostate cancer Neg Hx   . Bladder Cancer Neg Hx     Social History   Social History  . Marital status: Married    Spouse name: N/A  . Number of children: N/A  . Years of education: N/A   Occupational History  . Not on file.   Social History Main Topics  . Smoking status: Current Every Day Smoker    Packs/day: 1.00    Years: 30.00    Types: Cigarettes  . Smokeless tobacco: Never Used  . Alcohol use 0.0 oz/week     Comment: some during week  . Drug use: No  . Sexual activity: Not on file   Other Topics Concern  . Not on file   Social History Narrative  . No narrative on file    Review of Systems: See HPI, otherwise negative ROS  Physical Exam: BP 100/73   Pulse (!) 114  Temp 97 F (36.1 C)   Resp 17   Ht 6' 4.5" (1.943 m)   Wt 189 lb (85.7 kg)   SpO2 97%   BMI 22.71 kg/m  General:   Alert,  pleasant and cooperative in NAD Head:  Normocephalic and atraumatic. Neck:  Supple; no masses or thyromegaly. Lungs:  Clear throughout to auscultation.    Heart:  Regular rate and rhythm. Abdomen:  Soft, nontender and nondistended. Normal bowel sounds, without guarding, and without rebound.   Neurologic:  Alert and  oriented x4;  grossly normal neurologically.  Impression/Plan: Stephen Madden is here for an colonoscopy to be performed for history of colon polyps  Risks, benefits,  limitations, and alternatives regarding  colonoscopy have been reviewed with the patient.  Questions have been answered.  All parties agreeable.   Lucilla Lame, MD  08/28/2016, 8:44 AM

## 2016-08-28 NOTE — Anesthesia Post-op Follow-up Note (Cosign Needed)
Anesthesia QCDR form completed.        

## 2016-08-28 NOTE — Anesthesia Procedure Notes (Signed)
Performed by: Amrie Gurganus Pre-anesthesia Checklist: Patient identified, Emergency Drugs available, Suction available, Patient being monitored and Timeout performed Patient Re-evaluated:Patient Re-evaluated prior to inductionOxygen Delivery Method: Nasal cannula Preoxygenation: Pre-oxygenation with 100% oxygen Intubation Type: IV induction       

## 2016-08-28 NOTE — Transfer of Care (Signed)
Immediate Anesthesia Transfer of Care Note  Patient: Stephen Madden  Procedure(s) Performed: Procedure(s): COLONOSCOPY WITH PROPOFOL (N/A)  Patient Location: PACU  Anesthesia Type:General  Level of Consciousness: sedated  Airway & Oxygen Therapy: Patient Spontanous Breathing and Patient connected to nasal cannula oxygen  Post-op Assessment: Report given to RN and Post -op Vital signs reviewed and stable  Post vital signs: Reviewed and stable  Last Vitals:  Vitals:   08/28/16 0840 08/28/16 0842  BP: (!) 87/65 100/73  Pulse: (!) 117 (!) 114  Resp: 17 17  Temp: 36.1 C     Last Pain:  Vitals:   08/28/16 0714  TempSrc: Tympanic         Complications: No apparent anesthesia complications

## 2016-08-28 NOTE — Anesthesia Postprocedure Evaluation (Signed)
Anesthesia Post Note  Patient: [name]  Procedure(s) Performed: Procedure(s) (LRB): COLONOSCOPY WITH PROPOFOL (N/A)  Patient location during evaluation: Endoscopy Anesthesia Type: General Level of consciousness: awake and alert Pain management: pain level controlled Vital Signs Assessment: post-procedure vital signs reviewed and stable Respiratory status: spontaneous breathing, nonlabored ventilation, respiratory function stable and patient connected to nasal cannula oxygen Cardiovascular status: blood pressure returned to baseline and stable Postop Assessment: no signs of nausea or vomiting Anesthetic complications: no     Last Vitals:  Vitals:   08/28/16 0850 08/28/16 0900  BP: 100/82 117/86  Pulse: (!) 111 (!) 106  Resp: 19 19  Temp:      Last Pain:  Vitals:   08/28/16 0714  TempSrc: Tympanic                 Precious Haws Tawonna Esquer

## 2016-08-29 LAB — SURGICAL PATHOLOGY

## 2016-08-30 ENCOUNTER — Encounter: Payer: Self-pay | Admitting: Gastroenterology

## 2016-09-04 ENCOUNTER — Other Ambulatory Visit: Payer: Self-pay

## 2016-10-03 DIAGNOSIS — H40003 Preglaucoma, unspecified, bilateral: Secondary | ICD-10-CM | POA: Diagnosis not present

## 2016-10-08 ENCOUNTER — Other Ambulatory Visit: Payer: PPO

## 2016-10-08 DIAGNOSIS — R972 Elevated prostate specific antigen [PSA]: Secondary | ICD-10-CM

## 2016-10-09 LAB — PSA: Prostate Specific Ag, Serum: 2.7 ng/mL (ref 0.0–4.0)

## 2016-10-16 ENCOUNTER — Telehealth: Payer: Self-pay

## 2016-10-16 NOTE — Telephone Encounter (Signed)
Spoke with pt in reference to PSA results and f/u appt. Pt voiced understanding.  

## 2016-10-16 NOTE — Telephone Encounter (Signed)
-----   Message from Nori Riis, PA-C sent at 10/15/2016  2:42 PM EDT ----- Please let Mr. Lajuana Carry no that his PSA continues to decline.  We will need to see him in August for a 6 month follow-up. Please have patient get a PSA prior to that visit.

## 2017-04-03 DIAGNOSIS — H401131 Primary open-angle glaucoma, bilateral, mild stage: Secondary | ICD-10-CM | POA: Diagnosis not present

## 2017-05-06 DIAGNOSIS — Z0000 Encounter for general adult medical examination without abnormal findings: Secondary | ICD-10-CM | POA: Diagnosis not present

## 2017-08-14 DIAGNOSIS — H401131 Primary open-angle glaucoma, bilateral, mild stage: Secondary | ICD-10-CM | POA: Diagnosis not present

## 2017-08-21 DIAGNOSIS — H401131 Primary open-angle glaucoma, bilateral, mild stage: Secondary | ICD-10-CM | POA: Diagnosis not present

## 2017-08-25 DIAGNOSIS — J939 Pneumothorax, unspecified: Secondary | ICD-10-CM | POA: Diagnosis not present

## 2017-08-25 DIAGNOSIS — J9383 Other pneumothorax: Secondary | ICD-10-CM | POA: Diagnosis not present

## 2017-08-25 DIAGNOSIS — J449 Chronic obstructive pulmonary disease, unspecified: Secondary | ICD-10-CM | POA: Diagnosis not present

## 2017-09-24 DIAGNOSIS — J9383 Other pneumothorax: Secondary | ICD-10-CM | POA: Diagnosis not present

## 2017-09-24 DIAGNOSIS — J939 Pneumothorax, unspecified: Secondary | ICD-10-CM | POA: Diagnosis not present

## 2017-09-24 DIAGNOSIS — J449 Chronic obstructive pulmonary disease, unspecified: Secondary | ICD-10-CM | POA: Diagnosis not present

## 2017-10-25 ENCOUNTER — Other Ambulatory Visit: Payer: PPO

## 2017-10-25 DIAGNOSIS — J9383 Other pneumothorax: Secondary | ICD-10-CM | POA: Diagnosis not present

## 2017-10-25 DIAGNOSIS — J939 Pneumothorax, unspecified: Secondary | ICD-10-CM | POA: Diagnosis not present

## 2017-10-25 DIAGNOSIS — R972 Elevated prostate specific antigen [PSA]: Secondary | ICD-10-CM

## 2017-10-25 DIAGNOSIS — J449 Chronic obstructive pulmonary disease, unspecified: Secondary | ICD-10-CM | POA: Diagnosis not present

## 2017-10-26 LAB — PSA: Prostate Specific Ag, Serum: 2.4 ng/mL (ref 0.0–4.0)

## 2017-11-06 NOTE — Progress Notes (Signed)
11/07/2017 11:11 AM   Stephen Madden 1950/09/08 [id number]  Referring provider: Cletis Athens, MD [address] [place], [place] [address]  Chief Complaint  Patient presents with  . Elevated PSA    follow up    HPI: Patient is a 67 year old African-American male with a history of elevated PSA, erectile dysfunction and BPH with LUTS who presents today for 6 month follow-up.  Elevated PSA: PSA Trend 2.8 ng/mL on 06/12/2007 3.4 ng/mL on 03/31/2008 3.5 ng/mL on 04/06/2009 4.2 ng/mL on 09/05/2010 4.3 ng/mL on 02/26/2011 3.8 ng/mL on 02/05/2012 4.6 ng/mL on 01/27/2013 5.1 ng/mL on 02/09/2014 4.5 ng/mL on 03/26/2014  5.7 ng/mL on 09/25/2014- PSA, free 1.05 - started on 5 alpha reductase inhibitor   4.4 ng/mL on 01/03/2015 (8.8)   3.3 ng/mL on 08/08/2015 (6.6)   3.0 ng/mL on 02/02/2016 (6.0)   4.5 ng/mL on 07/30/2016 - 23% probability of prostate cancer - patient stopped the dutasteride   Patient stated his PCP had been filling his dutasteride for him since last year    2.7 ng/mL in 09/2016 (5.4)   2.4 ng/mL in 10/2017 (4.8)  Erectile dysfunction His SHIM score is 2, which is severe ED.   His previous SHIM score was 7.   He has been having difficulty with erections for over three years.  His major complaint is he cannot achieve an erection.  His libido is diminished.   His risk factors for ED are age, BPH, HTN, smoking and blood pressure medications.  He denies any painful erections or curvatures with his erections.   He had dizziness and headache with the 20 mg Cialis.  He and his wife are having marital problems and he will do without sex at this time.    SHIM    Row Name 11/07/17 1037         SHIM: Over the last 6 months:   How do you rate your confidence that you could get and keep an erection?  Low     When you had erections with sexual stimulation, how often were your erections hard enough for penetration (entering your partner)?  No Sexual Activity       During sexual intercourse, how often were you able to maintain your erection after you had penetrated (entered) your partner?  No Sexual Activity     During sexual intercourse, how difficult was it to maintain your erection to completion of intercourse?  Did not attempt intercourse     When you attempted sexual intercourse, how often was it satisfactory for you?  Did not attempt intercourse       SHIM Total Score   SHIM  2        Score: 1-7 Severe ED 8-11 Moderate ED 12-16 Mild-Moderate ED 17-21 Mild ED 22-25 No ED   BPH WITH LUTS His IPSS score today is 2, which is mild lower urinary tract symptomatology.  He is pleased with his quality life due to his urinary symptoms.  His previous IPSS score 0/1.  His main complaints are intermittency.  He denies any dysuria, hematuria or suprapubic pain.   He states he has been on the dutasteride since his last visit with [place].  It has been filled by his PCP.  He does not have a family history of PCa. IPSS    Row Name 11/07/17 1000         International Prostate Symptom Score   How often have you had the sensation of not emptying your bladder?  Less than 1 in 5     How often have you had to urinate less than every two hours?  Not at All     How often have you found you stopped and started again several times when you urinated?  Less than 1 in 5 times     How often have you found it difficult to postpone urination?  Not at All     How often have you had a weak urinary stream?  Not at All     How often have you had to strain to start urination?  Not at All     How many times did you typically get up at night to urinate?  None     Total IPSS Score  2       Quality of Life due to urinary symptoms   If you were to spend the rest of your life with your urinary condition just the way it is now how would you feel about that?  Pleased        Score:  1-7 Mild 8-19 Moderate 20-35 Severe    PMH: Past Medical History:  Diagnosis Date  . BPH  with obstruction/lower urinary tract symptoms   . Elevated PSA   . Hypertension   . Tobacco abuse     Surgical History: Past Surgical History:  Procedure Laterality Date  . COLONOSCOPY WITH PROPOFOL N/A 05/30/2015   Procedure: COLONOSCOPY WITH PROPOFOL;  Surgeon: Lucilla Lame, MD;  Location: [place];  Service: Endoscopy;  Laterality: N/A;  . COLONOSCOPY WITH PROPOFOL N/A 08/28/2016   Procedure: COLONOSCOPY WITH PROPOFOL;  Surgeon: Lucilla Lame, MD;  Location: [facility];  Service: Endoscopy;  Laterality: N/A;  . CYSTOSCOPY W/ RETROGRADES Bilateral 07/11/2015   Procedure: CYSTOSCOPY WITH RETROGRADE PYELOGRAM;  Surgeon: Hollice Espy, MD;  Location: [facility];  Service: Urology;  Laterality: Bilateral;  . CYSTOSCOPY WITH STENT PLACEMENT Bilateral 07/11/2015   Procedure: CYSTOSCOPY WITH STENT PLACEMENT;  Surgeon: Hollice Espy, MD;  Location: [facility];  Service: Urology;  Laterality: Bilateral;  . LAPAROSCOPIC PARTIAL COLECTOMY N/A 07/11/2015   Procedure: Laparoscopic right hemicolectomy;  Surgeon: Hubbard Robinson, MD;  Location: [facility];  Service: General;  Laterality: N/A;  . POLYPECTOMY  05/30/2015   Procedure: POLYPECTOMY;  Surgeon: Lucilla Lame, MD;  Location: [facility];  Service: Endoscopy;;    Home Medications:  Allergies as of 11/07/2017   No Known Allergies     Medication List        Accurate as of 11/07/17 11:11 AM. Always use your most recent med list.          atenolol 50 MG tablet Commonly known as:  TENORMIN Take 50 mg by mouth every morning.   BREO ELLIPTA 200-25 MCG/INH Aepb Generic drug:  fluticasone furoate-vilanterol INHALE 1 PUFF BY MOUTH DAILY   CIALIS 20 MG tablet Generic drug:  tadalafil TAKE 1 TABLET BY MOUTH 1/2 TO 1 HOUR BEFORE SEXUAL ACTIVITY   dutasteride 0.5 MG capsule Commonly known as:  AVODART Take 1 capsule (0.5 mg total) by mouth daily.   NIFEdipine 30 MG 24 hr tablet Commonly known as:  PROCARDIA-XL/ADALAT CC Take  30 mg by mouth every morning.   PROAIR HFA 108 (90 Base) MCG/ACT inhaler Generic drug:  albuterol Reported on 08/11/2015       Allergies: No Known Allergies  Family History: Family History  Problem Relation Age of Onset  . Heart disease Mother   . Stroke Father   .  Cancer Neg Hx   . Kidney disease Neg Hx   . Prostate cancer Neg Hx   . Bladder Cancer Neg Hx     Social History:  reports that he has been smoking cigarettes.  He has a 30.00 pack-year smoking history. He has never used smokeless tobacco. He reports that he drinks alcohol. He reports that he does not use drugs.  ROS: UROLOGY Frequent Urination?: No Hard to postpone urination?: No Burning/pain with urination?: No Get up at night to urinate?: No Leakage of urine?: No Urine stream starts and stops?: Yes Trouble starting stream?: No Do you have to strain to urinate?: No Blood in urine?: No Urinary tract infection?: No Sexually transmitted disease?: No Injury to kidneys or bladder?: No Painful intercourse?: No Weak stream?: No Erection problems?: No Penile pain?: No  Gastrointestinal Nausea?: No Vomiting?: No Indigestion/heartburn?: No Diarrhea?: No Constipation?: No  Constitutional Fever: No Night sweats?: Yes Weight loss?: No Fatigue?: No  Skin Skin rash/lesions?: No Itching?: No  Eyes Blurred vision?: No Double vision?: No  Ears/Nose/Throat Sore throat?: No Sinus problems?: No  Hematologic/Lymphatic Swollen glands?: No Easy bruising?: No  Cardiovascular Leg swelling?: No Chest pain?: No  Respiratory Cough?: No Shortness of breath?: Yes  Endocrine Excessive thirst?: No  Musculoskeletal Back pain?: No Joint pain?: No  Neurological Headaches?: No Dizziness?: No  Psychologic Depression?: No Anxiety?: No  Physical Exam: BP 133/77   Pulse 97   Ht 6\' 3"  (1.905 m)   Wt 206 lb (93.4 kg)   BMI 25.75 kg/m   Constitutional: Well nourished. Alert and oriented, No acute  distress. HEENT: [place] AT, moist mucus membranes. Trachea midline, no masses. Cardiovascular: No clubbing, cyanosis, or edema. Respiratory: Normal respiratory effort, no increased work of breathing. GI: Abdomen is soft, non tender, non distended, no abdominal masses. Liver and spleen not palpable.  No hernias appreciated.  Stool sample for occult testing is not indicated.   GU: No CVA tenderness.  No bladder fullness or masses.  Patient with uncircumcised phallus.  Foreskin easily retracted  Urethral meatus is patent.  No penile discharge. No penile lesions or rashes. Scrotum without lesions, cysts, rashes and/or edema.  Testicles are located scrotally bilaterally. No masses are appreciated in the testicles. Left and right epididymis are normal. Rectal: Patient with  normal sphincter tone. Anus and perineum without scarring or rashes. No rectal masses are appreciated. Prostate is approximately 45 grams, firmer on the right lobe, no nodules are appreciated. Seminal vesicles are normal. Skin: No rashes, bruises or suspicious lesions. Lymph: No cervical or inguinal adenopathy. Neurologic: Grossly intact, no focal deficits, moving all 4 extremities. Psychiatric: Normal mood and affect.   Laboratory Data: Lab Results  Component Value Date   WBC 6.5 07/30/2016   HGB 15.3 07/30/2016   HCT 44.9 07/30/2016   MCV 94 07/30/2016   PLT 191 07/30/2016    Lab Results  Component Value Date   CREATININE 1.10 07/30/2016   Lab Results  Component Value Date   AST 26 07/04/2015   Lab Results  Component Value Date   ALT 21 07/04/2015   PSA History: see hpi   I have reviewed the labs.  Assessment & Plan:    1. History of elevated PSA Patient's PCP has been filling his dutasteride  2. Abnormal prostate exam Explained to the patient that the findings of the firm area ion the right n the prostate may be a prostate cancer and recommended he undergo a biopsy at this time for  further evaluation -  patient's brother had undergone a prostate biopsy in the past and did not have a good experience and the patient is very reluctant to have the procedure done  The procedure is explained and the risks involved, such as blood in urine, blood in stool, blood in semen, infection, urinary retention, and on rare occasions sepsis and death.  Patient understands the risks as explained to him and he wishes to proceed.   He would like to talk this over with his brother before scheduling a biopsy - he will call us with his decision - he is thinking he may need to have the procedure under general anesthesia   3. Erectile dysfunction SHIM score is 2, it is worsening He is not interested it treatment at this time  4. BPH with LUTS IPSS score is 2/1, it is worsening Continue conservative management, avoiding bladder irritants and timed voiding's Continue dutasteride 0.5 mg daily RTC pending biopsy results    Return for patient to call back for an appointment .  These notes generated with voice recognition software. I apologize for typographical errors.  Zara Council, PA-C  [facility] [address] [place], [place] [address] [phone]

## 2017-11-07 ENCOUNTER — Encounter: Payer: Self-pay | Admitting: Urology

## 2017-11-07 ENCOUNTER — Ambulatory Visit (INDEPENDENT_AMBULATORY_CARE_PROVIDER_SITE_OTHER): Payer: PPO | Admitting: Urology

## 2017-11-07 VITALS — BP 133/77 | HR 97 | Ht 75.0 in | Wt 206.0 lb

## 2017-11-07 DIAGNOSIS — R3989 Other symptoms and signs involving the genitourinary system: Secondary | ICD-10-CM | POA: Diagnosis not present

## 2017-11-07 DIAGNOSIS — N529 Male erectile dysfunction, unspecified: Secondary | ICD-10-CM | POA: Diagnosis not present

## 2017-11-07 DIAGNOSIS — Z87898 Personal history of other specified conditions: Secondary | ICD-10-CM | POA: Diagnosis not present

## 2017-11-07 DIAGNOSIS — N401 Enlarged prostate with lower urinary tract symptoms: Secondary | ICD-10-CM

## 2017-11-07 DIAGNOSIS — N138 Other obstructive and reflux uropathy: Secondary | ICD-10-CM

## 2017-11-11 ENCOUNTER — Telehealth: Payer: Self-pay | Admitting: Urology

## 2017-11-11 NOTE — Telephone Encounter (Signed)
Please give pt a call about having a biopsy done.  He said he was supposed to call you back and let you know and he has some questions.  [phone]

## 2017-11-12 NOTE — Telephone Encounter (Signed)
Please schedule a TRUSPBx of the prostate for Stephen Madden.  Please confirm that he is not taking any anticoagulants.  He will need to have the instructions reviewed with him.  He also wants to have a Valium to take prior to the biopsy.

## 2017-11-14 ENCOUNTER — Other Ambulatory Visit: Payer: Self-pay | Admitting: Urology

## 2017-11-14 MED ORDER — DIAZEPAM 10 MG PO TABS: ORAL_TABLET | ORAL | 0 refills | Status: DC

## 2017-11-14 NOTE — Progress Notes (Signed)
Valium printed.

## 2017-11-14 NOTE — Telephone Encounter (Signed)
Patient has been scheduled for 12-12-17 and wants the valium to be sent in to the [facility] on [facility] unless you are going to print it and I can mail it out today with his instructions. I went over everything with him on the phone. Advised him to call back when he gets them if he has any questions.  Sharyn Lull

## 2017-11-24 DIAGNOSIS — J449 Chronic obstructive pulmonary disease, unspecified: Secondary | ICD-10-CM | POA: Diagnosis not present

## 2017-11-24 DIAGNOSIS — J9383 Other pneumothorax: Secondary | ICD-10-CM | POA: Diagnosis not present

## 2017-11-24 DIAGNOSIS — J939 Pneumothorax, unspecified: Secondary | ICD-10-CM | POA: Diagnosis not present

## 2017-12-12 ENCOUNTER — Encounter: Payer: Self-pay | Admitting: Urology

## 2017-12-12 ENCOUNTER — Ambulatory Visit (INDEPENDENT_AMBULATORY_CARE_PROVIDER_SITE_OTHER): Payer: PPO | Admitting: Urology

## 2017-12-12 ENCOUNTER — Other Ambulatory Visit: Payer: Self-pay | Admitting: Urology

## 2017-12-12 VITALS — BP 110/77 | HR 84 | Ht 75.0 in | Wt 206.0 lb

## 2017-12-12 DIAGNOSIS — R972 Elevated prostate specific antigen [PSA]: Secondary | ICD-10-CM

## 2017-12-12 DIAGNOSIS — R3989 Other symptoms and signs involving the genitourinary system: Secondary | ICD-10-CM

## 2017-12-12 MED ORDER — GENTAMICIN SULFATE 40 MG/ML IJ SOLN
80.0000 mg | Freq: Once | INTRAMUSCULAR | Status: AC
  Administered 2017-12-12: 80 mg via INTRAMUSCULAR

## 2017-12-12 MED ORDER — LEVOFLOXACIN 500 MG PO TABS
500.0000 mg | ORAL_TABLET | Freq: Once | ORAL | Status: AC
  Administered 2017-12-12: 500 mg via ORAL

## 2017-12-12 NOTE — Progress Notes (Signed)
Prostate Biopsy Procedure   Informed consent was obtained after discussing risks/benefits of the procedure.  A time out was performed to ensure correct patient identity.  Pre-Procedure: - Last PSA Level: 2.4 (uncorrected) 10/2017 - Gentamicin given prophylactically - Levaquin 500 mg administered PO -Transrectal Ultrasound performed revealing a 48 gm prostate -No significant hypoechoic or median lobe noted  Procedure: - Prostate block performed using 10 cc 1% lidocaine and biopsies taken from sextant areas, a total of 12 under ultrasound guidance.  Post-Procedure: - Patient tolerated the procedure well - He was counseled to seek immediate medical attention if experiences any severe pain, significant bleeding, or fevers - Return in one week to discuss biopsy results   Marshon Giovanni, MD

## 2017-12-25 ENCOUNTER — Other Ambulatory Visit: Payer: Self-pay | Admitting: Urology

## 2017-12-25 DIAGNOSIS — J939 Pneumothorax, unspecified: Secondary | ICD-10-CM | POA: Diagnosis not present

## 2017-12-25 DIAGNOSIS — J9383 Other pneumothorax: Secondary | ICD-10-CM | POA: Diagnosis not present

## 2017-12-25 DIAGNOSIS — J449 Chronic obstructive pulmonary disease, unspecified: Secondary | ICD-10-CM | POA: Diagnosis not present

## 2017-12-25 LAB — PATHOLOGY REPORT

## 2017-12-26 ENCOUNTER — Ambulatory Visit (INDEPENDENT_AMBULATORY_CARE_PROVIDER_SITE_OTHER): Payer: PPO | Admitting: Urology

## 2017-12-26 ENCOUNTER — Encounter: Payer: Self-pay | Admitting: Urology

## 2017-12-26 VITALS — BP 114/75 | HR 82 | Ht 75.0 in | Wt 203.8 lb

## 2017-12-26 DIAGNOSIS — C61 Malignant neoplasm of prostate: Secondary | ICD-10-CM

## 2017-12-26 NOTE — Progress Notes (Signed)
12/26/2017 7:23 PM   Stephen Madden July 06, 1950 [id number]  Referring provider: Cletis Athens, MD [address] [place], [place] [address]  Chief Complaint  Patient presents with  . Results    HPI: 67 year old male status post prostate biopsy on 12/12/2017 for an uncorrected PSA of 2.4.  He had no post biopsy complaints or problems.  Prostate volume was 48 g.  Standard 12 core biopsies were performed.  Pathology: Left apical biopsy showed a focus of 3+3 adenocarcinoma involving 10% of the submitted tissue.  Small foci ASAP seen at left lateral base/right lateral apex and high-grade PIN at right apex.  PMH: Past Medical History:  Diagnosis Date  . BPH with obstruction/lower urinary tract symptoms   . Elevated PSA   . Hypertension   . Tobacco abuse     Surgical History: Past Surgical History:  Procedure Laterality Date  . COLONOSCOPY WITH PROPOFOL N/A 05/30/2015   Procedure: COLONOSCOPY WITH PROPOFOL;  Surgeon: Stephen Lame, MD;  Location: [facility];  Service: Endoscopy;  Laterality: N/A;  . COLONOSCOPY WITH PROPOFOL N/A 08/28/2016   Procedure: COLONOSCOPY WITH PROPOFOL;  Surgeon: Stephen Lame, MD;  Location: [facility];  Service: Endoscopy;  Laterality: N/A;  . CYSTOSCOPY W/ RETROGRADES Bilateral 07/11/2015   Procedure: CYSTOSCOPY WITH RETROGRADE PYELOGRAM;  Surgeon: Stephen Espy, MD;  Location: [facility];  Service: Urology;  Laterality: Bilateral;  . CYSTOSCOPY WITH STENT PLACEMENT Bilateral 07/11/2015   Procedure: CYSTOSCOPY WITH STENT PLACEMENT;  Surgeon: Stephen Espy, MD;  Location: [facility];  Service: Urology;  Laterality: Bilateral;  . LAPAROSCOPIC PARTIAL COLECTOMY N/A 07/11/2015   Procedure: Laparoscopic right hemicolectomy;  Surgeon: Stephen Robinson, MD;  Location: [facility];  Service: General;  Laterality: N/A;  . POLYPECTOMY  05/30/2015   Procedure: POLYPECTOMY;  Surgeon: Stephen Lame, MD;  Location: [place];  Service: Endoscopy;;    Home  Medications:  Allergies as of 12/26/2017   No Known Allergies     Medication List        Accurate as of 12/26/17  7:23 PM. Always use your most recent med list.          atenolol 50 MG tablet Commonly known as:  TENORMIN Take 50 mg by mouth every morning.   BREO ELLIPTA 200-25 MCG/INH Aepb Generic drug:  fluticasone furoate-vilanterol INHALE 1 PUFF BY MOUTH DAILY   CIALIS 20 MG tablet Generic drug:  tadalafil TAKE 1 TABLET BY MOUTH 1/2 TO 1 HOUR BEFORE SEXUAL ACTIVITY   clotrimazole-betamethasone cream Commonly known as:  LOTRISONE APPLY TO AFFECTED AREA TWICE A DAY   diazepam 10 MG tablet Commonly known as:  VALIUM Take one 30 minutes prior to biopsy   dutasteride 0.5 MG capsule Commonly known as:  AVODART Take 1 capsule (0.5 mg total) by mouth daily.   NIFEdipine 30 MG 24 hr tablet Commonly known as:  PROCARDIA-XL/ADALAT CC Take 30 mg by mouth every morning.   PROAIR HFA 108 (90 Base) MCG/ACT inhaler Generic drug:  albuterol Reported on 08/11/2015       Allergies: No Known Allergies  Family History: Family History  Problem Relation Age of Onset  . Heart disease Mother   . Stroke Father   . Cancer Neg Hx   . Kidney disease Neg Hx   . Prostate cancer Neg Hx   . Bladder Cancer Neg Hx     Social History:  reports that he has been smoking cigarettes. He has a 30.00 pack-year smoking history. He has never used smokeless  tobacco. He reports that he drinks alcohol. He reports that he does not use drugs.  ROS: UROLOGY Frequent Urination?: No Hard to postpone urination?: No Burning/pain with urination?: No Get up at night to urinate?: No Leakage of urine?: No Urine stream starts and stops?: No Trouble starting stream?: No Do you have to strain to urinate?: No Blood in urine?: No Urinary tract infection?: No Sexually transmitted disease?: No Injury to kidneys or bladder?: No Painful intercourse?: No Weak stream?: No Erection problems?: No Penile  pain?: No  Gastrointestinal Nausea?: No Vomiting?: No Indigestion/heartburn?: No Diarrhea?: No Constipation?: No  Constitutional Fever: No Night sweats?: No Weight loss?: No Fatigue?: No  Skin Skin rash/lesions?: No Itching?: No  Eyes Blurred vision?: No Double vision?: No  Ears/Nose/Throat Sore throat?: No Sinus problems?: No  Hematologic/Lymphatic Swollen glands?: No Easy bruising?: No  Cardiovascular Leg swelling?: No Chest pain?: No  Respiratory Cough?: No Shortness of breath?: No  Endocrine Excessive thirst?: No  Musculoskeletal Back pain?: No Joint pain?: No  Neurological Headaches?: No Dizziness?: No  Psychologic Depression?: No Anxiety?: No  Physical Exam: BP 114/75 (BP Location: Left Arm, Patient Position: Sitting, Cuff Size: Large)   Pulse 82   Ht 6\' 3"  (1.905 m)   Wt 203 lb 12.8 oz (92.4 kg)   BMI 25.47 kg/m   Constitutional:  Alert and oriented, No acute distress. HEENT: [place] AT, moist mucus membranes.  Trachea midline, no masses. Cardiovascular: No clubbing, cyanosis, or edema. Respiratory: Normal respiratory effort, no increased work of breathing. GI: Abdomen is soft, nontender, nondistended, no abdominal masses GU: No CVA tenderness Lymph: No cervical or inguinal lymphadenopathy. Skin: No rashes, bruises or suspicious lesions. Neurologic: Grossly intact, no focal deficits, moving all 4 extremities. Psychiatric: Normal mood and affect.   Assessment & Plan:   1.  Clinical T1c very low risk prostate cancer The pathology report was discussed in detail with Stephen Madden.  Management options were discussed and he was informed he would be an excellent candidate for active surveillance.  The rationale and follow-up for AS was discussed.  Curative treatment options were also discussed including radical prostatectomy, EBRT and brachytherapy.  The pros and cons of each treatment were reviewed including side effects/complications.  He was  also informed there is not considered a "best" treatment option for prostate cancer and the decision is ultimately up to him.  He was interested in brachytherapy and a referral was placed to radiation oncology.  Greater than 50% of this 20-minute visit was spent counseling the patient.    Stephen Madden, [place] [address], [place], [place] [address] [phone]

## 2017-12-30 ENCOUNTER — Encounter: Payer: Self-pay | Admitting: Urology

## 2018-01-03 ENCOUNTER — Encounter: Payer: Self-pay | Admitting: Radiation Oncology

## 2018-01-03 ENCOUNTER — Ambulatory Visit
Admission: RE | Admit: 2018-01-03 | Discharge: 2018-01-03 | Disposition: A | Discharge status: Home / Self Care | Payer: PPO | Source: Ambulatory Visit | Attending: Radiation Oncology | Admitting: Radiation Oncology

## 2018-01-03 ENCOUNTER — Other Ambulatory Visit: Payer: Self-pay

## 2018-01-03 VITALS — BP 136/85 | HR 88 | Temp 96.4°F | Resp 18 | Wt 204.0 lb

## 2018-01-03 DIAGNOSIS — C61 Malignant neoplasm of prostate: Secondary | ICD-10-CM | POA: Diagnosis not present

## 2018-01-03 DIAGNOSIS — Z79899 Other long term (current) drug therapy: Secondary | ICD-10-CM | POA: Insufficient documentation

## 2018-01-03 DIAGNOSIS — N400 Enlarged prostate without lower urinary tract symptoms: Secondary | ICD-10-CM | POA: Diagnosis not present

## 2018-01-03 DIAGNOSIS — F17210 Nicotine dependence, cigarettes, uncomplicated: Secondary | ICD-10-CM | POA: Diagnosis not present

## 2018-01-03 DIAGNOSIS — I10 Essential (primary) hypertension: Secondary | ICD-10-CM | POA: Insufficient documentation

## 2018-01-03 NOTE — Consult Note (Signed)
NEW PATIENT EVALUATION  Name: Stephen Madden  MRN: [id number]  Date:   01/03/2018     DOB: [birth year]   This 67 y.o. male patient presents to the clinic for initial evaluation of stage I (T1 CN 0 M0) Gleason 6 (3+3) adenocarcinoma the prostate in 67 year old male.  REFERRING PHYSICIAN: Cletis Athens, MD  CHIEF COMPLAINT:  Chief Complaint  Patient presents with  . Prostate Cancer    Pt is here for initial consultation of prostate cancer    DIAGNOSIS: The encounter diagnosis was Malignant neoplasm of prostate ([place]).   PREVIOUS INVESTIGATIONS:  Pathology reports reviewed Clinical notes reviewed  HPI: patient is a 67 year old malewho is had a fluctuating PSA over the past several years. It was 4.5 one year ago most recently had dropped to 2.4.in July he had a transrectal ultrasound-guided biopsy showing 3 of 12 cores positive for Gleason 6 (3+3) adenocarcinoma the prostate.his prostate volume was 48 g. Patient does have some BPH with obstructive symptoms. He is otherwise asymptomatic he has nocturia 1.he has been discussing watchful waiting with urology although has requested evaluation by radiation oncology. He is accompanied by his wife today.  PLANNED TREATMENT REGIMEN: atchful waiting versus I-125 interstitial implant  PAST MEDICAL HISTORY:  has a past medical history of BPH with obstruction/lower urinary tract symptoms, Elevated PSA, Hypertension, and Tobacco abuse.    PAST SURGICAL HISTORY:  Past Surgical History:  Procedure Laterality Date  . COLONOSCOPY WITH PROPOFOL N/A 05/30/2015   Procedure: COLONOSCOPY WITH PROPOFOL;  Surgeon: Lucilla Lame, MD;  Location: [place];  Service: Endoscopy;  Laterality: N/A;  . COLONOSCOPY WITH PROPOFOL N/A 08/28/2016   Procedure: COLONOSCOPY WITH PROPOFOL;  Surgeon: Lucilla Lame, MD;  Location: [facility];  Service: Endoscopy;  Laterality: N/A;  . CYSTOSCOPY W/ RETROGRADES Bilateral 07/11/2015   Procedure: CYSTOSCOPY WITH RETROGRADE  PYELOGRAM;  Surgeon: Hollice Espy, MD;  Location: [facility];  Service: Urology;  Laterality: Bilateral;  . CYSTOSCOPY WITH STENT PLACEMENT Bilateral 07/11/2015   Procedure: CYSTOSCOPY WITH STENT PLACEMENT;  Surgeon: Hollice Espy, MD;  Location: [facility];  Service: Urology;  Laterality: Bilateral;  . LAPAROSCOPIC PARTIAL COLECTOMY N/A 07/11/2015   Procedure: Laparoscopic right hemicolectomy;  Surgeon: Hubbard Robinson, MD;  Location: [facility];  Service: General;  Laterality: N/A;  . POLYPECTOMY  05/30/2015   Procedure: POLYPECTOMY;  Surgeon: Lucilla Lame, MD;  Location: [place];  Service: Endoscopy;;    FAMILY HISTORY: family history includes Heart disease in his mother; Stroke in his father.  SOCIAL HISTORY:  reports that he has been smoking cigarettes. He has a 30.00 pack-year smoking history. He has never used smokeless tobacco. He reports that he drinks alcohol. He reports that he does not use drugs.  ALLERGIES: Patient has no known allergies.  MEDICATIONS:  Current Outpatient Medications  Medication Sig Dispense Refill  . atenolol (TENORMIN) 50 MG tablet Take 50 mg by mouth every morning.     Marland Kitchen BREO ELLIPTA 200-25 MCG/INH AEPB INHALE 1 PUFF BY MOUTH DAILY  4  . CIALIS 20 MG tablet TAKE 1 TABLET BY MOUTH 1/2 TO 1 HOUR BEFORE SEXUAL ACTIVITY 10 tablet 6  . clotrimazole-betamethasone (LOTRISONE) cream APPLY TO AFFECTED AREA TWICE A DAY  5  . dutasteride (AVODART) 0.5 MG capsule Take 1 capsule (0.5 mg total) by mouth daily. 90 capsule 4  . NIFEdipine (PROCARDIA-XL/ADALAT CC) 30 MG 24 hr tablet Take 30 mg by mouth every morning.     Marland Kitchen PROAIR HFA 108 (90 BASE)  MCG/ACT inhaler Reported on 08/11/2015  1  . diazepam (VALIUM) 10 MG tablet Take one 30 minutes prior to biopsy (Patient not taking: Reported on 01/03/2018) 1 tablet 0   No current facility-administered medications for this encounter.     ECOG PERFORMANCE STATUS:  0 - Asymptomatic  REVIEW OF SYSTEMS:  Patient denies any  weight loss, fatigue, weakness, fever, chills or night sweats. Patient denies any loss of vision, blurred vision. Patient denies any ringing  of the ears or hearing loss. No irregular heartbeat. Patient denies heart murmur or history of fainting. Patient denies any chest pain or pain radiating to her upper extremities. Patient denies any shortness of breath, difficulty breathing at night, cough or hemoptysis. Patient denies any swelling in the lower legs. Patient denies any nausea vomiting, vomiting of blood, or coffee ground material in the vomitus. Patient denies any stomach pain. Patient states has had normal bowel movements no significant constipation or diarrhea. Patient denies any dysuria, hematuria or significant nocturia. Patient denies any problems walking, swelling in the joints or loss of balance. Patient denies any skin changes, loss of hair or loss of weight. Patient denies any excessive worrying or anxiety or significant depression. Patient denies any problems with insomnia. Patient denies excessive thirst, polyuria, polydipsia. Patient denies any swollen glands, patient denies easy bruising or easy bleeding. Patient denies any recent infections, allergies or URI. Patient "s visual fields have not changed significantly in recent time.    PHYSICAL EXAM: BP 136/85 (BP Location: Left Arm, Patient Position: Sitting)   Pulse 88   Temp (!) 96.4 F (35.8 C) (Tympanic)   Resp 18   Wt 204 lb 0.6 oz (92.5 kg)   BMI 25.50 kg/m  On rectal exam rectal sphincter tone is good. Left lobe the prostate is slightly asymmetric and firm no discrete nodularity noted sulcus is preserved bilaterally. Rectal exam is otherwise unremarkable.Well-developed well-nourished patient in NAD. HEENT reveals PERLA, EOMI, discs not visualized.  Oral cavity is clear. No oral mucosal lesions are identified. Neck is clear without evidence of cervical or supraclavicular adenopathy. Lungs are clear to A&P. Cardiac examination is  essentially unremarkable with regular rate and rhythm without murmur rub or thrill. Abdomen is benign with no organomegaly or masses noted. Motor sensory and DTR levels are equal and symmetric in the upper and lower extremities. Cranial nerves II through XII are grossly intact. Proprioception is intact. No peripheral adenopathy or edema is identified. No motor or sensory levels are noted. Crude visual fields are within normal range.  LABORATORY DATA: pathology reports reviewed    RADIOLOGY RESULTS:no current films for review   IMPRESSION: stage I Gleason 6 adenocarcinoma the prostate in 67 year old male  PLAN: at this time I agree on watchful waiting versus definitive treatment. Patient is interested in I-125 interstitial implant. Risks and benefits of that procedure including be radiation safety precautions during implant possible fatigue possible increased lower urinary tract symptoms possible diarrhea all were discussed in detail with the patient and his wife. They would like to wait 6 months and go ahead with the implant at that time. I have set up follow-up appointments in 6 months with the patient. All aspects of watchful waiting and implant were discussed. They both seem to comprehend my treatment plan well.  I would like to take this opportunity to thank you for allowing me to participate in the care of your patient.Noreene Filbert, MD

## 2018-01-06 ENCOUNTER — Telehealth: Payer: Self-pay | Admitting: Urology

## 2018-01-06 NOTE — Telephone Encounter (Signed)
Pt called office asking to speak to [facility], pt inquiring about his dutesteride, pt asks does he need to continue to take it or discontinue. Pt states he can't remember what was discussed about his medication, also states something happened at the office to cause him not to be seen for almost a year.  Please advise patient about his dutesteride. Pt # [phone]

## 2018-01-08 NOTE — Telephone Encounter (Signed)
I have advised the patient to stay on his dutasteride until his placement of radioactive seeds.

## 2018-01-25 DIAGNOSIS — J449 Chronic obstructive pulmonary disease, unspecified: Secondary | ICD-10-CM | POA: Diagnosis not present

## 2018-01-25 DIAGNOSIS — J9383 Other pneumothorax: Secondary | ICD-10-CM | POA: Diagnosis not present

## 2018-01-25 DIAGNOSIS — J939 Pneumothorax, unspecified: Secondary | ICD-10-CM | POA: Diagnosis not present

## 2018-02-19 DIAGNOSIS — H401131 Primary open-angle glaucoma, bilateral, mild stage: Secondary | ICD-10-CM | POA: Diagnosis not present

## 2018-02-23 DIAGNOSIS — J939 Pneumothorax, unspecified: Secondary | ICD-10-CM

## 2018-02-23 HISTORY — DX: Pneumothorax, unspecified: J93.9

## 2018-02-24 ENCOUNTER — Encounter: Payer: Self-pay | Admitting: Emergency Medicine

## 2018-02-24 ENCOUNTER — Other Ambulatory Visit: Payer: Self-pay

## 2018-02-24 ENCOUNTER — Emergency Department: Payer: PPO

## 2018-02-24 ENCOUNTER — Inpatient Hospital Stay
Admission: EM | Admit: 2018-02-24 | Discharge: 2018-02-26 | DRG: 166 | Disposition: A | Discharge status: Home / Self Care | Payer: PPO | Attending: Cardiothoracic Surgery | Admitting: Cardiothoracic Surgery

## 2018-02-24 DIAGNOSIS — Z09 Encounter for follow-up examination after completed treatment for conditions other than malignant neoplasm: Secondary | ICD-10-CM

## 2018-02-24 DIAGNOSIS — Z86010 Personal history of colonic polyps: Secondary | ICD-10-CM | POA: Diagnosis not present

## 2018-02-24 DIAGNOSIS — R0609 Other forms of dyspnea: Secondary | ICD-10-CM | POA: Diagnosis not present

## 2018-02-24 DIAGNOSIS — Z4682 Encounter for fitting and adjustment of non-vascular catheter: Secondary | ICD-10-CM | POA: Diagnosis not present

## 2018-02-24 DIAGNOSIS — J939 Pneumothorax, unspecified: Secondary | ICD-10-CM | POA: Diagnosis not present

## 2018-02-24 DIAGNOSIS — R05 Cough: Secondary | ICD-10-CM | POA: Diagnosis not present

## 2018-02-24 DIAGNOSIS — E785 Hyperlipidemia, unspecified: Secondary | ICD-10-CM | POA: Diagnosis not present

## 2018-02-24 DIAGNOSIS — J9311 Primary spontaneous pneumothorax: Secondary | ICD-10-CM | POA: Diagnosis not present

## 2018-02-24 DIAGNOSIS — Z9689 Presence of other specified functional implants: Secondary | ICD-10-CM

## 2018-02-24 DIAGNOSIS — J9601 Acute respiratory failure with hypoxia: Secondary | ICD-10-CM | POA: Diagnosis present

## 2018-02-24 DIAGNOSIS — J9383 Other pneumothorax: Secondary | ICD-10-CM | POA: Diagnosis not present

## 2018-02-24 DIAGNOSIS — J449 Chronic obstructive pulmonary disease, unspecified: Secondary | ICD-10-CM | POA: Diagnosis not present

## 2018-02-24 DIAGNOSIS — I10 Essential (primary) hypertension: Secondary | ICD-10-CM | POA: Diagnosis not present

## 2018-02-24 DIAGNOSIS — R0602 Shortness of breath: Secondary | ICD-10-CM | POA: Diagnosis not present

## 2018-02-24 DIAGNOSIS — Z87891 Personal history of nicotine dependence: Secondary | ICD-10-CM | POA: Diagnosis not present

## 2018-02-24 DIAGNOSIS — J9811 Atelectasis: Secondary | ICD-10-CM | POA: Diagnosis not present

## 2018-02-24 LAB — CBC WITH DIFFERENTIAL/PLATELET
Basophils Absolute: 0.1 10*3/uL (ref 0–0.1)
Basophils Relative: 1 %
EOS ABS: 0.1 10*3/uL (ref 0–0.7)
Eosinophils Relative: 1 %
HEMATOCRIT: 45.6 % (ref 40.0–52.0)
Hemoglobin: 15.3 g/dL (ref 13.0–18.0)
LYMPHS ABS: 2 10*3/uL (ref 1.0–3.6)
Lymphocytes Relative: 26 %
MCH: 31.7 pg (ref 26.0–34.0)
MCHC: 33.6 g/dL (ref 32.0–36.0)
MCV: 94.3 fL (ref 80.0–100.0)
MONO ABS: 0.6 10*3/uL (ref 0.2–1.0)
MONOS PCT: 8 %
NEUTROS PCT: 64 %
Neutro Abs: 4.8 10*3/uL (ref 1.4–6.5)
Platelets: 298 10*3/uL (ref 150–440)
RBC: 4.84 MIL/uL (ref 4.40–5.90)
RDW: 14.4 % (ref 11.5–14.5)
WBC: 7.7 10*3/uL (ref 3.8–10.6)

## 2018-02-24 LAB — COMPREHENSIVE METABOLIC PANEL
ALT: 23 U/L (ref 0–44)
ANION GAP: 10 (ref 5–15)
AST: 34 U/L (ref 15–41)
Albumin: 4.7 g/dL (ref 3.5–5.0)
Alkaline Phosphatase: 74 U/L (ref 38–126)
BILIRUBIN TOTAL: 0.9 mg/dL (ref 0.3–1.2)
BUN: 18 mg/dL (ref 8–23)
CALCIUM: 9.4 mg/dL (ref 8.9–10.3)
CO2: 24 mmol/L (ref 22–32)
Chloride: 106 mmol/L (ref 98–111)
Creatinine, Ser: 1.09 mg/dL (ref 0.61–1.24)
GFR calc Af Amer: >60 mL/min (ref >60)
GFR calc non Af Amer: >60 mL/min (ref >60)
Glucose, Bld: 107 mg/dL — ABNORMAL HIGH (ref 70–99)
Potassium: 4.2 mmol/L (ref 3.5–5.1)
Sodium: 140 mmol/L (ref 135–145)
TOTAL PROTEIN: 8.2 g/dL — AB (ref 6.5–8.1)

## 2018-02-24 LAB — TROPONIN I

## 2018-02-24 MED ORDER — ONDANSETRON HCL 4 MG/2ML IJ SOLN
4.0000 mg | Freq: Once | INTRAMUSCULAR | Status: AC
  Administered 2018-02-24: 4 mg via INTRAVENOUS

## 2018-02-24 MED ORDER — ENOXAPARIN SODIUM 40 MG/0.4ML ~~LOC~~ SOLN
40.0000 mg | SUBCUTANEOUS | Status: DC
  Filled 2018-02-24 (×2): qty 0.4

## 2018-02-24 MED ORDER — KETAMINE HCL 10 MG/ML IJ SOLN
INTRAMUSCULAR | Status: AC
  Filled 2018-02-24: qty 1

## 2018-02-24 MED ORDER — KETAMINE HCL 10 MG/ML IJ SOLN
0.3000 mg/kg | Freq: Once | INTRAMUSCULAR | Status: AC
  Administered 2018-02-24: 29 mg via INTRAVENOUS

## 2018-02-24 MED ORDER — ATENOLOL 50 MG PO TABS
50.0000 mg | ORAL_TABLET | ORAL | Status: DC
  Administered 2018-02-25 – 2018-02-26 (×2): 50 mg via ORAL
  Filled 2018-02-24 (×2): qty 1

## 2018-02-24 MED ORDER — PANTOPRAZOLE SODIUM 40 MG PO TBEC
40.0000 mg | DELAYED_RELEASE_TABLET | Freq: Every day | ORAL | Status: DC
  Filled 2018-02-24 (×3): qty 1

## 2018-02-24 MED ORDER — DUTASTERIDE 0.5 MG PO CAPS
0.5000 mg | ORAL_CAPSULE | Freq: Every day | ORAL | Status: DC
  Filled 2018-02-24 (×3): qty 1

## 2018-02-24 MED ORDER — ACETAMINOPHEN 500 MG PO TABS: 1000.0000 mg | ORAL_TABLET | Freq: Four times a day (QID) | ORAL | Status: DC | PRN

## 2018-02-24 MED ORDER — KETOROLAC TROMETHAMINE 30 MG/ML IJ SOLN: 30.0000 mg | Freq: Four times a day (QID) | INTRAMUSCULAR | Status: DC | PRN

## 2018-02-24 MED ORDER — GUAIFENESIN ER 600 MG PO TB12: 600.0000 mg | ORAL_TABLET | Freq: Two times a day (BID) | ORAL | Status: DC | PRN

## 2018-02-24 MED ORDER — BUPIVACAINE HCL (PF) 0.5 % IJ SOLN
30.0000 mL | Freq: Once | INTRAMUSCULAR | Status: AC
  Administered 2018-02-24: 30 mL
  Filled 2018-02-24: qty 30

## 2018-02-24 MED ORDER — ONDANSETRON HCL 4 MG/2ML IJ SOLN
INTRAMUSCULAR | Status: AC
  Administered 2018-02-24: 4 mg via INTRAVENOUS
  Filled 2018-02-24: qty 2

## 2018-02-24 MED ORDER — FENTANYL CITRATE (PF) 100 MCG/2ML IJ SOLN
100.0000 ug | INTRAMUSCULAR | Status: DC | PRN
  Administered 2018-02-24: 100 ug via INTRAVENOUS
  Filled 2018-02-24: qty 2

## 2018-02-24 MED ORDER — FLUTICASONE FUROATE-VILANTEROL 200-25 MCG/INH IN AEPB
1.0000 | INHALATION_SPRAY | Freq: Every day | RESPIRATORY_TRACT | Status: DC
  Filled 2018-02-24: qty 28

## 2018-02-24 MED ORDER — NIFEDIPINE ER OSMOTIC RELEASE 30 MG PO TB24
30.0000 mg | ORAL_TABLET | ORAL | Status: DC
  Administered 2018-02-25 – 2018-02-26 (×2): 30 mg via ORAL
  Filled 2018-02-24 (×2): qty 1

## 2018-02-24 NOTE — ED Provider Notes (Signed)
[facility] Emergency Department Provider Note    First MD Initiated Contact with Patient 02/24/18 1013     (approximate)  I have reviewed the triage vital signs and the nursing notes.   HISTORY  Chief Complaint Shortness of Breath    HPI Stephen Madden is a 67 y.o. male with a history of prostate cancer presents the ER for evaluation of shortness of breath that started several days ago.  Thinks it occurred while he was mowing.  Denies any trauma.  Denies any chest pain.  Does not wear home oxygen.  Does not currently smoke but does have significant personal history of smoking.    Past Medical History:  Diagnosis Date  . BPH with obstruction/lower urinary tract symptoms   . Elevated PSA   . Hypertension   . Tobacco abuse    Family History  Problem Relation Age of Onset  . Heart disease Mother   . Stroke Father   . Cancer Neg Hx   . Kidney disease Neg Hx   . Prostate cancer Neg Hx   . Bladder Cancer Neg Hx    Past Surgical History:  Procedure Laterality Date  . COLONOSCOPY WITH PROPOFOL N/A 05/30/2015   Procedure: COLONOSCOPY WITH PROPOFOL;  Surgeon: Lucilla Lame, MD;  Location: [place];  Service: Endoscopy;  Laterality: N/A;  . COLONOSCOPY WITH PROPOFOL N/A 08/28/2016   Procedure: COLONOSCOPY WITH PROPOFOL;  Surgeon: Lucilla Lame, MD;  Location: [facility];  Service: Endoscopy;  Laterality: N/A;  . CYSTOSCOPY W/ RETROGRADES Bilateral 07/11/2015   Procedure: CYSTOSCOPY WITH RETROGRADE PYELOGRAM;  Surgeon: Hollice Espy, MD;  Location: [facility];  Service: Urology;  Laterality: Bilateral;  . CYSTOSCOPY WITH STENT PLACEMENT Bilateral 07/11/2015   Procedure: CYSTOSCOPY WITH STENT PLACEMENT;  Surgeon: Hollice Espy, MD;  Location: [facility];  Service: Urology;  Laterality: Bilateral;  . LAPAROSCOPIC PARTIAL COLECTOMY N/A 07/11/2015   Procedure: Laparoscopic right hemicolectomy;  Surgeon: Hubbard Irie Fiorello, MD;  Location: [facility];  Service:  General;  Laterality: N/A;  . POLYPECTOMY  05/30/2015   Procedure: POLYPECTOMY;  Surgeon: Lucilla Lame, MD;  Location: [place];  Service: Endoscopy;;   Patient Active Problem List   Diagnosis Date Noted  . Pneumothorax on left 02/24/2018  . Hx of colonic polyps   . Benign neoplasm of rectosigmoid junction   . Polyp of sigmoid colon   . History of elevated PSA 08/11/2015  . Erectile dysfunction of organic origin 08/11/2015  . Acute kidney injury ([place])   . S/P partial colectomy 07/11/2015  . Special screening for malignant neoplasms, colon   . Benign neoplasm of transverse colon   . Benign neoplasm of descending colon   . Benign neoplasm of sigmoid colon   . Rectal polyp   . Elevated PSA 01/10/2015  . BPH with obstruction/lower urinary tract symptoms 01/10/2015      Prior to Admission medications   Medication Sig Start Date End Date Taking? Authorizing Provider  atenolol (TENORMIN) 50 MG tablet Take 50 mg by mouth every morning.    Yes [provider]  BREO ELLIPTA 200-25 MCG/INH AEPB Inhale 1 puff into the lungs daily.    Yes [provider]  CIALIS 20 MG tablet TAKE 1 TABLET BY MOUTH 1/2 TO 1 HOUR BEFORE SEXUAL ACTIVITY 01/10/15  Yes McGowan, Shannon A, PA-C  dutasteride (AVODART) 0.5 MG capsule Take 1 capsule (0.5 mg total) by mouth daily. 02/06/16  Yes McGowan, Larene Beach A, PA-C  guaiFENesin ([place]) 600  MG 12 hr tablet Take 600 mg by mouth 2 (two) times daily as needed for cough or to loosen phlegm.   Yes [provider]  NIFEdipine (PROCARDIA-XL/ADALAT CC) 30 MG 24 hr tablet Take 30 mg by mouth every morning.    Yes [provider]  diazepam (VALIUM) 10 MG tablet Take one 30 minutes prior to biopsy Patient not taking: Reported on 01/03/2018 11/14/17   Zara Council A, PA-C    Allergies Patient has no known allergies.    Social History Social History   Tobacco Use  . Smoking status: Current Every Day Smoker    Packs/day:  1.00    Years: 30.00    Pack years: 30.00    Types: Cigarettes  . Smokeless tobacco: Never Used  Substance Use Topics  . Alcohol use: Yes    Alcohol/week: 0.0 standard drinks    Comment: some during week  . Drug use: No    Review of Systems Patient denies headaches, rhinorrhea, blurry vision, numbness, shortness of breath, chest pain, edema, cough, abdominal pain, nausea, vomiting, diarrhea, dysuria, fevers, rashes or hallucinations unless otherwise stated above in HPI. ____________________________________________   PHYSICAL EXAM:  VITAL SIGNS: Vitals:   02/24/18 1400 02/24/18 1415  BP: (!) 135/103 (!) 136/102  Pulse: 74   Resp: 13 14  Temp:    SpO2: 96%     Constitutional: Alert and oriented.  Eyes: Conjunctivae are normal.  Head: Atraumatic. Nose: No congestion/rhinnorhea. Mouth/Throat: Mucous membranes are moist.   Neck: No stridor. Painless ROM.  Cardiovascular: Normal rate, regular rhythm. Grossly normal heart sounds.  Good peripheral circulation. Respiratory: mild respratory distress, diminished BS on left.  No retractions. Lungs CTAB. Gastrointestinal: Soft and nontender. No distention. No abdominal bruits. No CVA tenderness. Genitourinary:  Musculoskeletal: No lower extremity tenderness nor edema.  No joint effusions. Neurologic:  Normal speech and language. No gross focal neurologic deficits are appreciated. No facial droop Skin:  Skin is warm, dry and intact. No rash noted. Psychiatric: Mood and affect are normal. Speech and behavior are normal.  ____________________________________________   LABS (all labs ordered are listed, but only abnormal results are displayed)  Results for orders placed or performed during the hospital encounter of 02/24/18 (from the past 24 hour(s))  CBC with Differential/Platelet     Status: None   Collection Time: 02/24/18 11:33 AM  Result Value Ref Range   WBC 7.7 3.8 - 10.6 K/uL   RBC 4.84 4.40 - 5.90 MIL/uL   Hemoglobin  15.3 13.0 - 18.0 g/dL   HCT 45.6 40.0 - 52.0 %   MCV 94.3 80.0 - 100.0 fL   MCH 31.7 26.0 - 34.0 pg   MCHC 33.6 32.0 - 36.0 g/dL   RDW 14.4 11.5 - 14.5 %   Platelets 298 150 - 440 K/uL   Neutrophils Relative % 64 %   Neutro Abs 4.8 1.4 - 6.5 K/uL   Lymphocytes Relative 26 %   Lymphs Abs 2.0 1.0 - 3.6 K/uL   Monocytes Relative 8 %   Monocytes Absolute 0.6 0.2 - 1.0 K/uL   Eosinophils Relative 1 %   Eosinophils Absolute 0.1 0 - 0.7 K/uL   Basophils Relative 1 %   Basophils Absolute 0.1 0 - 0.1 K/uL  Comprehensive metabolic panel     Status: Abnormal   Collection Time: 02/24/18 11:33 AM  Result Value Ref Range   Sodium 140 135 - 145 mmol/L   Potassium 4.2 3.5 - 5.1 mmol/L   Chloride 106  98 - 111 mmol/L   CO2 24 22 - 32 mmol/L   Glucose, Bld 107 (H) 70 - 99 mg/dL   BUN 18 8 - 23 mg/dL   Creatinine, Ser 1.09 0.61 - 1.24 mg/dL   Calcium 9.4 8.9 - 10.3 mg/dL   Total Protein 8.2 (H) 6.5 - 8.1 g/dL   Albumin 4.7 3.5 - 5.0 g/dL   AST 34 15 - 41 U/L   ALT 23 0 - 44 U/L   Alkaline Phosphatase 74 38 - 126 U/L   Total Bilirubin 0.9 0.3 - 1.2 mg/dL   GFR calc non Af Amer >60 >60 mL/min   GFR calc Af Amer >60 >60 mL/min   Anion gap 10 5 - 15  Troponin I     Status: None   Collection Time: 02/24/18 11:33 AM  Result Value Ref Range   Troponin I <0.03 <0.03 ng/mL   ____________________________________________  EKG My review and personal interpretation at Time: 10:06   Indication: sob  Rate: 105  Rhythm: sinus Axis: normal Other: normal intervals, no stemi ____________________________________________  RADIOLOGY  I personally reviewed all radiographic images ordered to evaluate for the above acute complaints and reviewed radiology reports and findings.  These findings were personally discussed with the patient.  Please see medical record for radiology report.  ____________________________________________   PROCEDURES  Procedure(s) performed:  CHEST TUBE INSERTION Date/Time:  02/24/2018 12:29 PM Performed by: Merlyn Lot, MD Authorized by: Merlyn Lot, MD   Consent:    Consent obtained:  Written   Consent given by:  Patient   Risks discussed:  Bleeding, infection, incomplete drainage, nerve damage, pain and damage to surrounding structures   Alternatives discussed:  No treatment, delayed treatment and alternative treatment Pre-procedure details:    Skin preparation:  Hibiclens and ChloraPrep Sedation:    Sedation type:  Anxiolysis Anesthesia (see MAR for exact dosages):    Anesthesia method:  Local infiltration   Local anesthetic:  Bupivacaine 0.5% w/o epi Procedure details:    Placement location:  L lateral   Scalpel size:  11   Tube size (Fr):  Minicatheter   Tube connected to:  Heimlich valve, water seal and suction   Drainage characteristics:  Air only   Suture material:  2-0 silk   Dressing:  Xeroform gauze and 4x4 sterile gauze Post-procedure details:    Post-insertion x-ray findings: tube in good position     Patient tolerance of procedure:  Tolerated well, no immediate complications .Critical Care Performed by: Merlyn Lot, MD Authorized by: Merlyn Lot, MD   Critical care provider statement:    Critical care time (minutes):  35   Critical care was necessary to treat or prevent imminent or life-threatening deterioration of the following conditions:  Respiratory failure   Critical care was time spent personally by me on the following activities:  Discussions with consultants, evaluation of patient's response to treatment, examination of patient, ordering and performing treatments and interventions, ordering and review of laboratory studies, ordering and review of radiographic studies, pulse oximetry, re-evaluation of patient's condition, obtaining history from patient or surrogate and review of old charts      Critical Care performed: yes ____________________________________________   INITIAL IMPRESSION /  ASSESSMENT AND PLAN / ED COURSE  Pertinent labs & imaging results that were available during my care of the patient were reviewed by me and considered in my medical decision making (see chart for details).   DDX: Asthma, PTX, copd, CHF, pna, ptx, malignancy, Pe, anemia  Stephen Madden is a 59 y.o. who presents to the ED with shortness of breath as described above.  Patient with evidence of new hypoxia requiring supplemental oxygen with diminished breath sounds on the left with chest x-ray showing evidence of moderate to large left-sided pneumothorax likely with some tension physiology as he is tachycardic.  Patient will be given IV fluids.  Will insert chest tube for decompression.  Clinical Course as of Feb 24 1509  Mon Feb 24, 2018  1215 Patient tolerated to thoracoscopy very well.   [PR]  1304 Chest tube is functioning well despite radiology report of "kink".  I think this reflects more of an anterior deviation as there is still good tidling with respiration.   [PR]    Clinical Course User Index [PR] Merlyn Lot, MD     As part of my medical decision making, I reviewed the following data within the [place] notes reviewed and incorporated, Labs reviewed, notes from prior ED visits and [place] Controlled Substance Database   ____________________________________________   FINAL CLINICAL IMPRESSION(S) / ED DIAGNOSES  Final diagnoses:  Primary spontaneous pneumothorax  Acute respiratory failure with hypoxia ([place])      NEW MEDICATIONS STARTED DURING THIS VISIT:  New Prescriptions   No medications on file     Note:  This document was prepared using Dragon voice recognition software and may include unintentional dictation errors.    Merlyn Lot, MD 02/24/18 [phone]

## 2018-02-24 NOTE — Progress Notes (Signed)
Patient begin to SAT at 87% with 1/2 L O2. Increased O2 to 1-1/2 and patient holding at 94 to 95% with no SOB. Unable to wean patient off of O2 at this time.

## 2018-02-24 NOTE — Progress Notes (Signed)
IS education complete. Pt understands technique and reason for use. Pt inspire 1775ml+. Pt independent with use.

## 2018-02-24 NOTE — H&P (Addendum)
[facility] Admission Note  Stephen Madden September 04, 1950  [id number].    Requesting MD: Dr. Merlyn Lot, MD Chief Complaint/Reason for Consult: Left Pneumothorax  HPI:  Stephen Madden is a 67 y.o. male who presented to the ED with SOB. He endorses a 1 week history of worsening SOB which he noticed with exertion. He went to his PCP this morning and was found to be hypoxic, which prompted his visit to the ED this afternoon. His lowest O2 saturation in the ED was 89%. He denied any fevers, chills, CP, abdominal pain, or lower extremity edema or pain. He endorse a 40 ppy smoking history however quit in July. He does not use home 02. He denied any traumatic injury or event prior to the onset of the SOB.   In the ED this afternoon he was found to have a 50% PTX on the left and a chest tube was placed by the ED physician. Surgery was consulted for management of the chest tube.   ROS: Review of Systems  Constitutional: Negative for chills and fever.  Respiratory: Positive for cough and shortness of breath. Negative for sputum production.   Cardiovascular: Negative for chest pain, palpitations and leg swelling.  Gastrointestinal: Negative for abdominal pain, diarrhea, nausea and vomiting.  Genitourinary: Negative for dysuria and hematuria.  Neurological: Negative for dizziness and headaches.  All other systems reviewed and are negative.   Family History  Problem Relation Age of Onset  . Heart disease Mother   . Stroke Father   . Cancer Neg Hx   . Kidney disease Neg Hx   . Prostate cancer Neg Hx   . Bladder Cancer Neg Hx     Past Medical History:  Diagnosis Date  . BPH with obstruction/lower urinary tract symptoms   . Elevated PSA   . Hypertension   . Tobacco abuse     Past Surgical History:  Procedure Laterality Date  . COLONOSCOPY WITH PROPOFOL N/A 05/30/2015   Procedure: COLONOSCOPY WITH PROPOFOL;  Surgeon: Lucilla Lame, MD;  Location: [place];  Service:  Endoscopy;  Laterality: N/A;  . COLONOSCOPY WITH PROPOFOL N/A 08/28/2016   Procedure: COLONOSCOPY WITH PROPOFOL;  Surgeon: Lucilla Lame, MD;  Location: [facility];  Service: Endoscopy;  Laterality: N/A;  . CYSTOSCOPY W/ RETROGRADES Bilateral 07/11/2015   Procedure: CYSTOSCOPY WITH RETROGRADE PYELOGRAM;  Surgeon: Hollice Espy, MD;  Location: [facility];  Service: Urology;  Laterality: Bilateral;  . CYSTOSCOPY WITH STENT PLACEMENT Bilateral 07/11/2015   Procedure: CYSTOSCOPY WITH STENT PLACEMENT;  Surgeon: Hollice Espy, MD;  Location: [facility];  Service: Urology;  Laterality: Bilateral;  . LAPAROSCOPIC PARTIAL COLECTOMY N/A 07/11/2015   Procedure: Laparoscopic right hemicolectomy;  Surgeon: Hubbard Robinson, MD;  Location: [facility];  Service: General;  Laterality: N/A;  . POLYPECTOMY  05/30/2015   Procedure: POLYPECTOMY;  Surgeon: Lucilla Lame, MD;  Location: [place];  Service: Endoscopy;;    Social History:  reports that he has been smoking cigarettes. He has a 30.00 pack-year smoking history. He has never used smokeless tobacco. He reports that he drinks alcohol. He reports that he does not use drugs.  Allergies: No Known Allergies   (Not in a hospital admission)  Blood pressure (!) 135/103, pulse 74, temperature 97.8 F (36.6 C), temperature source Oral, resp. rate 13, height 6' 5"  (1.956 m), weight 95.3 kg, SpO2 96 %.   Physical Exam: Physical Exam  Constitutional: He is oriented to person, place, and time. He appears well-developed and  well-nourished. Nasal cannula in place.  Eyes: Pupils are equal, round, and reactive to light.  Neck: Neck supple. No tracheal deviation present.  Cardiovascular: Normal rate, regular rhythm and normal heart sounds. Exam reveals no gallop and no friction rub.  No murmur heard. Pulmonary/Chest: No respiratory distress. He has decreased breath sounds ([place]). He has no wheezes. He has no rales.  Chest tube in left lateral chest, no air  leak  Abdominal: Soft. He exhibits no distension. There is no tenderness. There is no rebound and no guarding.  Musculoskeletal:       Right lower leg: He exhibits no tenderness and no edema.       Left lower leg: He exhibits no tenderness and no edema.  Neurological: He is alert and oriented to person, place, and time. He is not disoriented. No cranial nerve deficit.  Skin: Skin is warm and dry. No erythema.  Psychiatric: He has a normal mood and affect. His behavior is normal.    Results for orders placed or performed during the hospital encounter of 02/24/18 (from the past 48 hour(s))  CBC with Differential/Platelet     Status: None   Collection Time: 02/24/18 11:33 AM  Result Value Ref Range   WBC 7.7 3.8 - 10.6 K/uL   RBC 4.84 4.40 - 5.90 MIL/uL   Hemoglobin 15.3 13.0 - 18.0 g/dL   HCT 45.6 40.0 - 52.0 %   MCV 94.3 80.0 - 100.0 fL   MCH 31.7 26.0 - 34.0 pg   MCHC 33.6 32.0 - 36.0 g/dL   RDW 14.4 11.5 - 14.5 %   Platelets 298 150 - 440 K/uL   Neutrophils Relative % 64 %   Neutro Abs 4.8 1.4 - 6.5 K/uL   Lymphocytes Relative 26 %   Lymphs Abs 2.0 1.0 - 3.6 K/uL   Monocytes Relative 8 %   Monocytes Absolute 0.6 0.2 - 1.0 K/uL   Eosinophils Relative 1 %   Eosinophils Absolute 0.1 0 - 0.7 K/uL   Basophils Relative 1 %   Basophils Absolute 0.1 0 - 0.1 K/uL    Comment: Performed at [facility], [address] [place], [place] [address]  Comprehensive metabolic panel     Status: Abnormal   Collection Time: 02/24/18 11:33 AM  Result Value Ref Range   Sodium 140 135 - 145 mmol/L   Potassium 4.2 3.5 - 5.1 mmol/L    Comment: HEMOLYSIS AT THIS LEVEL MAY AFFECT RESULT   Chloride 106 98 - 111 mmol/L   CO2 24 22 - 32 mmol/L   Glucose, Bld 107 (H) 70 - 99 mg/dL   BUN 18 8 - 23 mg/dL   Creatinine, Ser 1.09 0.61 - 1.24 mg/dL   Calcium 9.4 8.9 - 10.3 mg/dL   Total Protein 8.2 (H) 6.5 - 8.1 g/dL   Albumin 4.7 3.5 - 5.0 g/dL   AST 34 15 - 41 U/L   ALT 23 0 - 44 U/L    Alkaline Phosphatase 74 38 - 126 U/L   Total Bilirubin 0.9 0.3 - 1.2 mg/dL   GFR calc non Af Amer >60 >60 mL/min   GFR calc Af Amer >60 >60 mL/min    Comment: (NOTE) The eGFR has been calculated using the CKD EPI equation. This calculation has not been validated in all clinical situations. eGFR's persistently <60 mL/min signify possible Chronic Kidney Disease.    Anion gap 10 5 - 15    Comment: Performed at [facility], [address] [place], [place] [address]  Troponin I     Status: None   Collection Time: 02/24/18 11:33 AM  Result Value Ref Range   Troponin I <0.03 <0.03 ng/mL    Comment: Performed at [facility], [address] [place], [place] [address]   Dg Chest Portable 1 View  Result Date: 02/24/2018 CLINICAL DATA:  67 year old male. Post chest tube placement. Subsequent encounter. EXAM: PORTABLE CHEST 1 VIEW COMPARISON:  02/24/2018 10:42 a.m. chest x-ray. FINDINGS: Left-sided chest tube has been placed. There is a kink of the chest tube. Significant decrease in size of left-sided pneumothorax with 5-10% left-sided pneumothorax remaining. Elevated left hemidiaphragm with left base atelectasis. Apical pleural thickening without associated bony destruction. Atherosclerotic changes aorta. Heart size top-normal. IMPRESSION: 1. Left-sided chest tube has been placed. There is a kink of the chest tube. 2. Significant decrease in size of left-sided pneumothorax with 5-10% left-sided pneumothorax remaining. 3. Elevated left hemidiaphragm with left base atelectasis. 4.  Aortic Atherosclerosis (ICD10-I70.0). Electronically Signed   By: Genia Del M.D.   On: 02/24/2018 12:49   Dg Chest Portable 1 View  Result Date: 02/24/2018 CLINICAL DATA:  Increasing shortness of breath, dyspnea on exertion for the past 2 days. Current smoker. EXAM: PORTABLE CHEST 1 VIEW COMPARISON:  Chest x-ray of July 04, 2015 FINDINGS: There is an approximately 50% left pneumothorax. There is  no shift of the mediastinum. The right lung is well-expanded and clear. The heart and pulmonary vascularity are not normal. The bony thorax exhibits no acute abnormality. IMPRESSION: Approximately 50% left pneumothorax. Probable underlying COPD. These results were called by telephone at the time of interpretation on 02/24/2018 at 11:01 am to Dr. Merlyn Lot , who verbally acknowledged these results. Electronically Signed   By: David  Martinique M.D.   On: 02/24/2018 11:01      Assessment/Plan  Left Pneumothorax, >50% - Patient with significant smoking history, currently on a non-smoker, with >50% left spontaneous pneumothorax.  - Review CXR (pre and post chest tube insertion) with Dr. Genevive Bi, PTX has resolved but he does appear to have significant underlying COPD - Will leave on wall suction for 24-48 hours - Daily AM CXR - Regular Diet, Heart Healthy  - Pain Control as needed - Mobilize as tolerates - DVT Prophylaxis  Edison Simon, PA-C [facility] 02/24/2018, 2:18 PM [phone] M-F: 7am - 4pm

## 2018-02-24 NOTE — ED Triage Notes (Signed)
Pt reports Saturday he was mowing and then he started getting winded. Pt reports increase in SOB and states that he can't walk far without feeling like he can not breathe.

## 2018-02-24 NOTE — ED Notes (Signed)
MD states ketamine for pain control

## 2018-02-25 ENCOUNTER — Inpatient Hospital Stay: Payer: PPO

## 2018-02-25 MED ORDER — GUAIFENESIN ER 600 MG PO TB12
600.0000 mg | ORAL_TABLET | Freq: Two times a day (BID) | ORAL | Status: DC
  Administered 2018-02-25 – 2018-02-26 (×3): 600 mg via ORAL
  Filled 2018-02-25 (×3): qty 1

## 2018-02-25 NOTE — Progress Notes (Addendum)
[facility] Progress Note     Subjective: Patient sitting up comfortably in bed this morning. He denied any chest pain or SOB. Tried to wean of 02 yesterday but was hypoxic to 87%. Has been using IS. Tolerating a diet.   Objective: Vital signs in last 24 hours: Temp:  [97.6 F (36.4 C)-98.1 F (36.7 C)] 97.6 F (36.4 C) (10/08 0420) Pulse Rate:  [68-101] 91 (10/08 0420) Resp:  [10-22] 18 (10/08 0420) BP: (114-183)/(87-132) 114/87 (10/08 0420) SpO2:  [89 %-97 %] 96 % (10/08 0420) Weight:  [95.3 kg] 95.3 kg (10/07 0959) Last BM Date: 02/24/18  Intake/Output from previous day: 10/07 0701 - 10/08 0700 In: 0  Out: 375 [Urine:375] Intake/Output this shift: No intake/output data recorded.  PE: Gen:  Alert, NAD, pleasant Card:  Regular rate and rhythm, pedal pulses 2+ BL Pulm:  Normal effort, clear to auscultation bilaterally, chest tube in the left lateral chest wall Abd: Soft, non-tender, non-distended Skin: warm and dry, no rashes  Psych: A&Ox3   Lab Results:  Recent Labs    02/24/18 1133  WBC 7.7  HGB 15.3  HCT 45.6  PLT 298   BMET Recent Labs    02/24/18 1133  NA 140  K 4.2  CL 106  CO2 24  GLUCOSE 107*  BUN 18  CREATININE 1.09  CALCIUM 9.4   PT/INR No results for input(s): LABPROT, INR in the last 72 hours. CMP     Component Value Date/Time   NA 140 02/24/2018 1133   NA 146 (H) 07/30/2016 0914   K 4.2 02/24/2018 1133   CL 106 02/24/2018 1133   CO2 24 02/24/2018 1133   GLUCOSE 107 (H) 02/24/2018 1133   BUN 18 02/24/2018 1133   BUN 14 07/30/2016 0914   CREATININE 1.09 02/24/2018 1133   CALCIUM 9.4 02/24/2018 1133   PROT 8.2 (H) 02/24/2018 1133   ALBUMIN 4.7 02/24/2018 1133   AST 34 02/24/2018 1133   ALT 23 02/24/2018 1133   ALKPHOS 74 02/24/2018 1133   BILITOT 0.9 02/24/2018 1133   GFRNONAA >60 02/24/2018 1133   GFRAA >60 02/24/2018 1133   Lipase  No results found for: LIPASE     Studies/Results: Dg Chest Port 1  View  Result Date: 02/25/2018 CLINICAL DATA:  67 year old male with left-sided pneumothorax. Subsequent encounter. EXAM: PORTABLE CHEST 1 VIEW COMPARISON:  02/24/2018. FINDINGS: Left-sided chest tube in place with kink of chest tube as previously noted. Residual left pneumothorax located apically and medially approximately 5-10% and without significant change. Left base subsegmental atelectasis slightly improved. No pulmonary edema. No plain film evidence of pulmonary malignancy. Heart size within normal limits. Calcified aorta. IMPRESSION: 1. Left-sided chest tube in place with kink of chest tube as previously noted. 2. Residual left pneumothorax located apically and medially approximately 5-10% and without significant change. 3. Left base subsegmental atelectasis slightly improved. Electronically Signed   By: Genia Del M.D.   On: 02/25/2018 09:09   Dg Chest Portable 1 View  Result Date: 02/24/2018 CLINICAL DATA:  67 year old male. Post chest tube placement. Subsequent encounter. EXAM: PORTABLE CHEST 1 VIEW COMPARISON:  02/24/2018 10:42 a.m. chest x-ray. FINDINGS: Left-sided chest tube has been placed. There is a kink of the chest tube. Significant decrease in size of left-sided pneumothorax with 5-10% left-sided pneumothorax remaining. Elevated left hemidiaphragm with left base atelectasis. Apical pleural thickening without associated bony destruction. Atherosclerotic changes aorta. Heart size top-normal. IMPRESSION: 1. Left-sided chest tube has been placed. There is a  kink of the chest tube. 2. Significant decrease in size of left-sided pneumothorax with 5-10% left-sided pneumothorax remaining. 3. Elevated left hemidiaphragm with left base atelectasis. 4.  Aortic Atherosclerosis (ICD10-I70.0). Electronically Signed   By: Genia Del M.D.   On: 02/24/2018 12:49   Dg Chest Portable 1 View  Result Date: 02/24/2018 CLINICAL DATA:  Increasing shortness of breath, dyspnea on exertion for the past 2  days. Current smoker. EXAM: PORTABLE CHEST 1 VIEW COMPARISON:  Chest x-ray of July 04, 2015 FINDINGS: There is an approximately 50% left pneumothorax. There is no shift of the mediastinum. The right lung is well-expanded and clear. The heart and pulmonary vascularity are not normal. The bony thorax exhibits no acute abnormality. IMPRESSION: Approximately 50% left pneumothorax. Probable underlying COPD. These results were called by telephone at the time of interpretation on 02/24/2018 at 11:01 am to Dr. Merlyn Lot , who verbally acknowledged these results. Electronically Signed   By: David  Martinique M.D.   On: 02/24/2018 11:01    Anti-infectives: Anti-infectives (From admission, onward)   None       Assessment/Plan  Left Pneumothorax, >50% - CXR this morning shows residual 5-10% apical left PTX which is unchanged from yesterday - No air leak appreciated on pleurovac this morning, will leave on suction this morning and repeat CXR in the AM. He is okay to go to water seal to ambulate but he must return to suction when back in the room.  - Wean to room air as tolerates - Regular Diet - DVT Prophylaxis   LOS: 1 day    Edison Simon , PA-C [facility] 02/25/2018, 9:41 AM [phone] M-F: 7am - 4pm

## 2018-02-25 NOTE — Plan of Care (Signed)
Monitoring chest tube. Chest x-ray performed.  Problem: Education: Goal: Knowledge of General Education information will improve Description Including pain rating scale, medication(s)/side effects and non-pharmacologic comfort measures Outcome: Progressing   Problem: Health Behavior/Discharge Planning: Goal: Ability to manage health-related needs will improve Outcome: Progressing   Problem: Clinical Measurements: Goal: Ability to maintain clinical measurements within normal limits will improve Outcome: Progressing Goal: Will remain free from infection Outcome: Progressing Goal: Diagnostic test results will improve Outcome: Progressing Goal: Respiratory complications will improve Outcome: Progressing Goal: Cardiovascular complication will be avoided Outcome: Progressing   Problem: Activity: Goal: Risk for activity intolerance will decrease Outcome: Progressing   Problem: Nutrition: Goal: Adequate nutrition will be maintained Outcome: Progressing   Problem: Coping: Goal: Level of anxiety will decrease Outcome: Progressing   Problem: Elimination: Goal: Will not experience complications related to bowel motility Outcome: Progressing Goal: Will not experience complications related to urinary retention Outcome: Progressing   Problem: Pain Managment: Goal: General experience of comfort will improve Outcome: Progressing   Problem: Safety: Goal: Ability to remain free from injury will improve Outcome: Progressing   Problem: Skin Integrity: Goal: Risk for impaired skin integrity will decrease Outcome: Progressing

## 2018-02-25 NOTE — Progress Notes (Signed)
  Patient ID: Stephen Madden, male   DOB: November 07, 1950, 67 y.o.   MRN: [id number]  HISTORY: Feels much better today.  Not short of breath.  Hungry.   Vitals:   02/25/18 0420 02/25/18 1102  BP: 114/87 138/81  Pulse: 91 73  Resp: 18   Temp: 97.6 F (36.4 C) 97.6 F (36.4 C)  SpO2: 96% 94%     EXAM:    Resp: Lungs are clear bilaterally but yet still distant.  No respiratory distress, normal effort. Heart:  Regular without murmurs Abd:  Abdomen is soft, non distended and non tender. No masses are palpable.  There is no rebound and no guarding.  Neurological: Alert and oriented to person, place, and time. Coordination normal.  Skin: Skin is warm and dry. No rash noted. No diaphoretic. No erythema. No pallor.  Psychiatric: Normal mood and affect. Normal behavior. Judgment and thought content normal.   No air leak seen.  Independent review of CXRay shows minimal residual pneumothorax   ASSESSMENT: Spontaneous pneumothorax.    PLAN:   Leave on suction today - can be off suction to ambulate in halls CXRay in the morning - if OK, water seal.    Nestor Lewandowsky, MDPatient ID: Stephen Madden, male   DOB: 05-15-1951, 67 y.o.   MRN: [id number]

## 2018-02-26 ENCOUNTER — Inpatient Hospital Stay: Payer: PPO

## 2018-02-26 LAB — HIV ANTIBODY (ROUTINE TESTING W REFLEX): HIV Screen 4th Generation wRfx: NONREACTIVE

## 2018-02-26 MED ORDER — INFLUENZA VAC SPLIT HIGH-DOSE 0.5 ML IM SUSY
0.5000 mL | PREFILLED_SYRINGE | INTRAMUSCULAR | Status: DC
  Filled 2018-02-26: qty 0.5

## 2018-02-26 NOTE — Discharge Summary (Signed)
Discharge Summary  Patient ID: TOMISLAV MICALE MRN: [id number] DOB/AGE: December 22, 1950 67 y.o.  Admit date: 02/24/2018 Discharge date: 02/26/2018  Discharge Diagnoses Left Pneumothorax  Consultants None  Procedures None  HPI: Stephen Madden is a 67 y.o. male who presented to the ED with SOB. He endorses a 1 week history of worsening SOB which he noticed with exertion. He went to his PCP this morning and was found to be hypoxic, which prompted his visit to the ED this afternoon. His lowest O2 saturation in the ED was 89%. He denied any fevers, chills, CP, abdominal pain, or lower extremity edema or pain. He endorse a 40 ppy smoking history however quit in July. He does not use home 02. He denied any traumatic injury or event prior to the onset of the SOB.   In the ED this afternoon he was found to have a 50% PTX on the left and a chest tube was placed by the ED physician. Surgery was consulted for management of the chest tube.   Hospital Course: Over the course of the next 2 days he was place on 20 cm wall suction and monitored with morning CXR which continued to show resolution of his PTX. On 10/09, he was placed on water seal in the morning and a CXR was obtained 6 hours later, which continued to show resolution of the PTX. His chest tube was removed on 10/09.   On the day of discharge (10/09), [name] was tolerating a diet, mobilizing, and his pain was adequately controlled. His chest tube was removed and an occlusive dressing was placed. He will follow up with Dr. Genevive Bi in 1 week. All his questions and concerns were addressed and answered.      Allergies as of 02/26/2018   No Known Allergies     Medication List    TAKE these medications   atenolol 50 MG tablet Commonly known as:  TENORMIN Take 50 mg by mouth every morning.   BREO ELLIPTA 200-25 MCG/INH Aepb Generic drug:  fluticasone furoate-vilanterol Inhale 1 puff into the lungs daily.   CIALIS 20 MG tablet Generic drug:   tadalafil TAKE 1 TABLET BY MOUTH 1/2 TO 1 HOUR BEFORE SEXUAL ACTIVITY   diazepam 10 MG tablet Commonly known as:  VALIUM Take one 30 minutes prior to biopsy   dutasteride 0.5 MG capsule Commonly known as:  AVODART Take 1 capsule (0.5 mg total) by mouth daily.   guaiFENesin 600 MG 12 hr tablet Commonly known as:  MUCINEX Take 600 mg by mouth 2 (two) times daily as needed for cough or to loosen phlegm.   NIFEdipine 30 MG 24 hr tablet Commonly known as:  PROCARDIA-XL/ADALAT CC Take 30 mg by mouth every morning.        Follow-up Information    Nestor Lewandowsky, MD. Schedule an appointment as soon as possible for a visit in 1 week(s).   Specialties:  Cardiothoracic Surgery, General Surgery Why:  Left pneumothorax, hospital follow up with dr. Domenic Moras information: [address] [place] [address] [phone]           Signed: Edison Simon , Hershal Coria [facility]  02/26/2018, 1:46 PM [phone] M-F: 7am - 4pm

## 2018-02-26 NOTE — Care Management (Signed)
Patient to discharge today.  Chest tube removed.   Ambulating independently on RA.  Follow up has been made with Dr Genevive Bi.  No RNCM needs identified.

## 2018-02-26 NOTE — Progress Notes (Signed)
Patient ID: Stephen Madden, male   DOB: 1951/01/28, 67 y.o.   MRN: [id number]    Patient ID: Stephen Madden, male   DOB: 07-Nov-1950, 67 y.o.   MRN: [id number]  HISTORY: He feels better today compared to yesterday.  Walked twice in the halls and did well with that.  Not short of breath and no chest pain.   Vitals:   02/26/18 0434 02/26/18 0830  BP: 113/81 (!) 132/92  Pulse: (!) 59 64  Resp: 16 20  Temp: 97.8 F (36.6 C) 97.6 F (36.4 C)  SpO2: 98% 93%     EXAM:    Resp: Lungs are clear bilaterally.  No respiratory distress, normal effort. Heart:  Regular without murmurs Abd:  Abdomen is soft, non distended and non tender. No masses are palpable.  There is no rebound and no guarding.  Neurological: Alert and oriented to person, place, and time. Coordination normal.  Skin: Skin is warm and dry. No rash noted. No diaphoretic. No erythema. No pallor.  Psychiatric: Normal mood and affect. Normal behavior. Judgment and thought content normal.   No air leak seen.  Independent review of his CXRay shows no change in the small pneumothorax  ASSESSMENT: Spontaneous pneumothorax   PLAN:   Will place chest tube to water seal and repeat CXRay in 6 hours.  If no change in the chest xray, discontinue chest tube.      Nestor Lewandowsky, MD

## 2018-02-27 ENCOUNTER — Telehealth: Payer: Self-pay | Admitting: Radiology

## 2018-02-27 ENCOUNTER — Other Ambulatory Visit: Payer: Self-pay | Admitting: Radiology

## 2018-02-27 DIAGNOSIS — C61 Malignant neoplasm of prostate: Secondary | ICD-10-CM

## 2018-02-27 NOTE — Telephone Encounter (Signed)
Dr Bernardo Heater, Patient has been scheduled with you for Prostate volume study on 04/29/2018 & seed implants on 05/27/2018.

## 2018-02-28 ENCOUNTER — Telehealth: Payer: Self-pay | Admitting: Radiology

## 2018-02-28 NOTE — Telephone Encounter (Signed)
Patient requests sedation for prostate volume study scheduled 04/29/2018. States oral medication is not enough. Please advise.

## 2018-03-02 NOTE — Telephone Encounter (Signed)
This would need to be coordinated through the cancer center

## 2018-03-03 NOTE — Telephone Encounter (Signed)
Since our office is the one responsible for scheduling the procedures in the OR we will need to schedule sedation.  Do you want MAC?

## 2018-03-04 NOTE — Telephone Encounter (Signed)
Yes on MAC.  The cancer center will need to make sure his history and physical is up-to-date.

## 2018-03-06 ENCOUNTER — Other Ambulatory Visit: Payer: Self-pay

## 2018-03-06 DIAGNOSIS — J939 Pneumothorax, unspecified: Secondary | ICD-10-CM

## 2018-03-07 ENCOUNTER — Other Ambulatory Visit: Payer: Self-pay

## 2018-03-07 ENCOUNTER — Ambulatory Visit
Admission: RE | Admit: 2018-03-07 | Discharge: 2018-03-07 | Disposition: A | Discharge status: Home / Self Care | Payer: PPO | Source: Ambulatory Visit | Attending: Cardiothoracic Surgery | Admitting: Cardiothoracic Surgery

## 2018-03-07 ENCOUNTER — Encounter: Payer: Self-pay | Admitting: Cardiothoracic Surgery

## 2018-03-07 ENCOUNTER — Ambulatory Visit (INDEPENDENT_AMBULATORY_CARE_PROVIDER_SITE_OTHER): Payer: PPO | Admitting: Cardiothoracic Surgery

## 2018-03-07 VITALS — BP 172/100 | HR 89 | Temp 97.3°F | Resp 22 | Ht 77.0 in | Wt 203.4 lb

## 2018-03-07 DIAGNOSIS — J939 Pneumothorax, unspecified: Secondary | ICD-10-CM

## 2018-03-07 NOTE — Progress Notes (Signed)
  Patient ID: Stephen Madden, male   DOB: 1950/10/26, 67 y.o.   MRN: [id number]  HISTORY: He returns today in follow-up.  He was discharged about a week and a half ago after suffering a spontaneous left-sided pneumothorax.  He has an occasional episode of shortness of breath.  He is walking about a mile per day on the treadmill and has no problems with that.  He would like to go back on his inhalers and I told him that he may do so.   Vitals:   03/07/18 0843  BP: (!) 172/100  Pulse: 89  Resp: (!) 22  Temp: (!) 97.3 F (36.3 C)  SpO2: 95%     EXAM:    Resp: Lungs are clear bilaterally.  No respiratory distress, normal effort. Heart:  Regular without murmurs Abd:  Abdomen is soft, non distended and non tender. No masses are palpable.  There is no rebound and no guarding.  Neurological: Alert and oriented to person, place, and time. Coordination normal.  Skin: Skin is warm and dry. No rash noted. No diaphoretic. No erythema. No pallor.  Psychiatric: Normal mood and affect. Normal behavior. Judgment and thought content normal.    ASSESSMENT: Spontaneous pneumothorax left side   PLAN:   I have independently reviewed his chest x-ray.  There is a small pneumothorax present.  It appears somewhat better than upon discharge.  I explained to him that he could go back on his inhalers.  He should continue to walk on the treadmill as much as he can and he was advised to continue abstinence from tobacco and alcohol.  He will come back and see [place] as needed.    Nestor Lewandowsky, MD

## 2018-03-07 NOTE — Patient Instructions (Signed)
Please call our office if you have questions or concerns.   

## 2018-03-07 NOTE — Addendum Note (Signed)
Addended by: Celene Kras on: 03/07/2018 09:18 AM   Modules accepted: Orders

## 2018-03-10 ENCOUNTER — Ambulatory Visit
Admission: RE | Admit: 2018-03-10 | Discharge: 2018-03-10 | Disposition: A | Discharge status: Home / Self Care | Payer: PPO | Source: Ambulatory Visit | Attending: Cardiothoracic Surgery | Admitting: Cardiothoracic Surgery

## 2018-03-10 ENCOUNTER — Encounter: Payer: Self-pay | Admitting: Cardiothoracic Surgery

## 2018-03-10 ENCOUNTER — Ambulatory Visit (INDEPENDENT_AMBULATORY_CARE_PROVIDER_SITE_OTHER): Payer: PPO | Admitting: Cardiothoracic Surgery

## 2018-03-10 ENCOUNTER — Other Ambulatory Visit: Payer: Self-pay

## 2018-03-10 VITALS — BP 128/82 | HR 72 | Temp 97.7°F | Resp 18 | Ht 77.0 in | Wt 201.0 lb

## 2018-03-10 DIAGNOSIS — J939 Pneumothorax, unspecified: Secondary | ICD-10-CM | POA: Insufficient documentation

## 2018-03-10 NOTE — Progress Notes (Signed)
  Patient ID: Stephen Madden, male   DOB: Dec 30, 1950, 67 y.o.   MRN: [id number]  HISTORY: Today he states that he feels very well overall.  He has no shortness of breath whatsoever.  He has no chest pain.  He denies any cough or fever.   Vitals:   03/10/18 0915  BP: 128/82  Pulse: 72  Resp: 18  Temp: 97.7 F (36.5 C)  SpO2: 94%     EXAM:    Resp: Lungs are clear bilaterally.  No respiratory distress, normal effort. Heart:  Regular without murmurs Abd:  Abdomen is soft, non distended and non tender. No masses are palpable.  There is no rebound and no guarding.  Neurological: Alert and oriented to person, place, and time. Coordination normal.  Skin: Skin is warm and dry. No rash noted. No diaphoretic. No erythema. No pallor.  Psychiatric: Normal mood and affect. Normal behavior. Judgment and thought content normal.    ASSESSMENT: I have independently reviewed the patient's chest x-ray.  There is some air between the upper and lower lobes along the great fissure.  The apical portion is less well-defined.   PLAN:   Since the patient is currently asymptomatic I will see him back in a week to 10 days with another chest x-ray.  He knows that he can always call us if he needs to.    Nestor Lewandowsky, MD

## 2018-03-10 NOTE — Patient Instructions (Addendum)
Please call our office if you have questions or concerns.  Be sure to have chest xray prior to your appointment.

## 2018-03-14 ENCOUNTER — Other Ambulatory Visit: Payer: Self-pay

## 2018-03-14 DIAGNOSIS — J939 Pneumothorax, unspecified: Secondary | ICD-10-CM

## 2018-03-17 ENCOUNTER — Encounter: Payer: Self-pay | Admitting: Cardiothoracic Surgery

## 2018-03-17 ENCOUNTER — Ambulatory Visit
Admission: RE | Admit: 2018-03-17 | Discharge: 2018-03-17 | Disposition: A | Discharge status: Home / Self Care | Payer: PPO | Source: Ambulatory Visit | Attending: Cardiothoracic Surgery | Admitting: Cardiothoracic Surgery

## 2018-03-17 ENCOUNTER — Other Ambulatory Visit: Payer: Self-pay

## 2018-03-17 ENCOUNTER — Ambulatory Visit (INDEPENDENT_AMBULATORY_CARE_PROVIDER_SITE_OTHER): Payer: PPO | Admitting: Cardiothoracic Surgery

## 2018-03-17 VITALS — BP 127/83 | HR 63 | Temp 97.5°F | Wt 202.0 lb

## 2018-03-17 DIAGNOSIS — J939 Pneumothorax, unspecified: Secondary | ICD-10-CM | POA: Diagnosis not present

## 2018-03-17 NOTE — Patient Instructions (Signed)
Please give [place] a call in case you have any questions or concerns.  At this time your pneumothorax had resolved. However, remember that you have a 10% chance that the lung could collapse again. But just know that you have 90% chance that it will never happen.   Try to stop smoking.   Pneumothorax A pneumothorax, commonly called a collapsed lung, is a condition in which air leaks from a lung and builds up in the space between the lung and the chest wall (pleural space). The air in a pneumothorax is trapped outside the lung and takes up space, preventing the lung from fully expanding. This is a condition that usually occurs suddenly. The buildup of air may be small or large. A small pneumothorax may go away on its own. When a pneumothorax is larger, it will often require medical treatment and hospitalization. What are the causes? A pneumothorax can sometimes happen quickly with no apparent cause. People with underlying lung problems, particularly COPD or emphysema, are at higher risk of pneumothorax. However, pneumothorax can happen quickly even in people with no prior known lung problems. Trauma, surgery, medical procedures, or injury to the chest wall can also cause a pneumothorax. What are the signs or symptoms? Sometimes a pneumothorax will have no symptoms. When symptoms are present, they can include:  Chest pain.  Shortness of breath.  Increased rate of breathing.  Bluish color to your lips or skin (cyanosis).  How is this diagnosed? Pneumothorax is usually diagnosed by a chest X-ray or chest CT scan. Your health care provider will also take a medical history and perform a physical exam to determine why you may have a pneumothorax. How is this treated? A small pneumothorax may go away on its own without treatment. Extra oxygen can sometimes help a small pneumothorax go away more quickly. For a larger pneumothorax or a pneumothorax that is causing symptoms, a procedure is usually needed to  drain the air.In some cases, the health care provider may drain the air using a needle. In other cases, a chest tube may be inserted into the pleural space. A chest tube is a small tube placed between the ribs and into the pleural space. This removes the extra air and allows the lung to expand back to its normal size. A large pneumothorax will usually require a hospital stay. If there is ongoing air leakage into the pleural space, then the chest tube may need to remain in place for several days until the air leak has healed. In some cases, surgery may be needed. Follow these instructions at home:  Only take over-the-counter or prescription medicines as directed by your health care provider.  If a cough or pain makes it difficult for you to sleep at night, try sleeping in a semi-upright position in a recliner or by using 2 or 3 pillows.  Rest and limit activity as directed by your health care provider.  If you had a chest tube and it was removed, ask your health care provider when it is okay to remove the dressing. Until your health care provider says you can remove the dressing, do not allow it to get wet.  Do not smoke. Smoking is a risk factor for pneumothorax.  Do not fly in an airplane or scuba dive until your health care provider says it is okay.  Follow up with your health care provider as directed. Get help right away if:  You have increasing chest pain or shortness of breath.  You have  a cough that is not controlled with suppressants.  You begin coughing up blood.  You have pain that is getting worse or is not controlled with medicines.  You cough up thick, discolored mucus (sputum) that is yellow to green in color.  You have redness, increasing pain, or discharge at the site where a chest tube had been in place (if your pneumothorax was treated with a chest tube).  The site where your chest tube was located opens up.  You feel air coming out of the site where the chest tube  was placed.  You have a fever or persistent symptoms for more than 2-3 days.  You have a fever and your symptoms suddenly get worse. This information is not intended to replace advice given to you by your health care provider. Make sure you discuss any questions you have with your health care provider. Document Released: 05/07/2005 Document Revised: 10/13/2015 Document Reviewed: 09/30/2013 Elsevier Interactive Patient Education  2018 [place] with Quitting Smoking Quitting smoking is a physical and mental challenge. You will face cravings, withdrawal symptoms, and temptation. Before quitting, work with your health care provider to make a plan that can help you cope. Preparation can help you quit and keep you from giving in. How can I cope with cravings? Cravings usually last for 5-10 minutes. If you get through it, the craving will pass. Consider taking the following actions to help you cope with cravings:  Keep your mouth busy: ? Chew sugar-free gum. ? Suck on hard candies or a straw. ? Brush your teeth.  Keep your hands and body busy: ? Immediately change to a different activity when you feel a craving. ? Squeeze or play with a ball. ? Do an activity or a hobby, like making bead jewelry, practicing needlepoint, or working with wood. ? Mix up your normal routine. ? Take a short exercise break. Go for a quick walk or run up and down stairs. ? Spend time in public places where smoking is not allowed.  Focus on doing something kind or helpful for someone else.  Call a friend or family member to talk during a craving.  Join a support group.  Call a quit line, such as 1-800-QUIT-NOW.  Talk with your health care provider about medicines that might help you cope with cravings and make quitting easier for you.  How can I deal with withdrawal symptoms? Your body may experience negative effects as it tries to get used to not having nicotine in the system. These effects  are called withdrawal symptoms. They may include:  Feeling hungrier than normal.  Trouble concentrating.  Irritability.  Trouble sleeping.  Feeling depressed.  Restlessness and agitation.  Craving a cigarette.  To manage withdrawal symptoms:  Avoid places, people, and activities that trigger your cravings.  Remember why you want to quit.  Get plenty of sleep.  Avoid coffee and other caffeinated drinks. These may worsen some of your symptoms.  How can I handle social situations? Social situations can be difficult when you are quitting smoking, especially in the first few weeks. To manage this, you can:  Avoid parties, bars, and other social situations where people might be smoking.  Avoid alcohol.  Leave right away if you have the urge to smoke.  Explain to your family and friends that you are quitting smoking. Ask for understanding and support.  Plan activities with friends or family where smoking is not an option.  What are some ways I can  cope with stress? Wanting to smoke may cause stress, and stress can make you want to smoke. Find ways to manage your stress. Relaxation techniques can help. For example:  Breathe slowly and deeply, in through your nose and out through your mouth.  Listen to soothing, relaxing music.  Talk with a family member or friend about your stress.  Light a candle.  Soak in a bath or take a shower.  Think about a peaceful place.  What are some ways I can prevent weight gain? Be aware that many people gain weight after they quit smoking. However, not everyone does. To keep from gaining weight, have a plan in place before you quit and stick to the plan after you quit. Your plan should include:  Having healthy snacks. When you have a craving, it may help to: ? Eat plain popcorn, crunchy carrots, celery, or other cut vegetables. ? Chew sugar-free gum.  Changing how you eat: ? Eat small portion sizes at meals. ? Eat 4-6 small meals  throughout the day instead of 1-2 large meals a day. ? Be mindful when you eat. Do not watch television or do other things that might distract you as you eat.  Exercising regularly: ? Make time to exercise each day. If you do not have time for a long workout, do short bouts of exercise for 5-10 minutes several times a day. ? Do some form of strengthening exercise, like weight lifting, and some form of aerobic exercise, like running or swimming.  Drinking plenty of water or other low-calorie or no-calorie drinks. Drink 6-8 glasses of water daily, or as much as instructed by your health care provider.  Summary  Quitting smoking is a physical and mental challenge. You will face cravings, withdrawal symptoms, and temptation to smoke again. Preparation can help you as you go through these challenges.  You can cope with cravings by keeping your mouth busy (such as by chewing gum), keeping your body and hands busy, and making calls to family, friends, or a helpline for people who want to quit smoking.  You can cope with withdrawal symptoms by avoiding places where people smoke, avoiding drinks with caffeine, and getting plenty of rest.  Ask your health care provider about the different ways to prevent weight gain, avoid stress, and handle social situations. This information is not intended to replace advice given to you by your health care provider. Make sure you discuss any questions you have with your health care provider. Document Released: 05/04/2016 Document Revised: 05/04/2016 Document Reviewed: 05/04/2016 Elsevier Interactive Patient Education  [employer].

## 2018-03-17 NOTE — Progress Notes (Signed)
  Patient ID: Stephen Madden, male   DOB: [birth year], 67 y.o.   MRN: [id number]  HISTORY: He returns today in follow-up.  He states that he has had no further shortness of breath or chest pain.  His only complaint today is not related to his lungs.  He states that he has been more sleepy than usual.  Otherwise he has no medical issues today.   Vitals:   03/17/18 0831  BP: 127/83  Pulse: 63  Temp: (!) 97.5 F (36.4 C)  SpO2: 90%     EXAM:    Resp: Lungs are clear bilaterally.  No respiratory distress, normal effort. Heart:  Regular without murmurs Abd:  Abdomen is soft, non distended and non tender. No masses are palpable.  There is no rebound and no guarding.  Neurological: Alert and oriented to person, place, and time. Coordination normal.  Skin: Skin is warm and dry. No rash noted. No diaphoretic. No erythema. No pallor.  Psychiatric: Normal mood and affect. Normal behavior. Judgment and thought content normal.    ASSESSMENT: Left-sided pneumothorax.  I have independently reviewed his chest x-ray and I do not see any evidence of pneumothorax on this.  I told him that we would get the official radiology report later today and if there is any need for additional follow-up we would contact him.  Otherwise we will see him on an as-needed basis.  All of his questions were answered.   PLAN:   He will return to clinic as needed.  He was instructed if he develops chest pain and/or shortness of breath that he should present to the emergency room as soon as possible.  We discussed the possibility of recurrence including potential work-up at that time.    Stephen Lewandowsky, MD

## 2018-03-21 DIAGNOSIS — Z23 Encounter for immunization: Secondary | ICD-10-CM | POA: Diagnosis not present

## 2018-03-25 ENCOUNTER — Ambulatory Visit: Payer: PPO | Admitting: Radiation Oncology

## 2018-03-27 DIAGNOSIS — J9383 Other pneumothorax: Secondary | ICD-10-CM | POA: Diagnosis not present

## 2018-03-27 DIAGNOSIS — J939 Pneumothorax, unspecified: Secondary | ICD-10-CM | POA: Diagnosis not present

## 2018-03-27 DIAGNOSIS — J449 Chronic obstructive pulmonary disease, unspecified: Secondary | ICD-10-CM | POA: Diagnosis not present

## 2018-04-03 ENCOUNTER — Telehealth: Payer: Self-pay | Admitting: Urology

## 2018-04-03 NOTE — Telephone Encounter (Signed)
Stephen Madden will not need to take the dutasteride anymore.  He does not need a refill.

## 2018-04-03 NOTE — Telephone Encounter (Signed)
Advised patient that per Zara Council patient no longer needs to take dutesteride, patient understands.

## 2018-04-03 NOTE — Telephone Encounter (Signed)
Pt taking Dutesteride, only has 2 pills left, pt wants to know if he should continue to take them after he is out. Does he need a refill. Pt states he is having surgery May 27, 2018. Please advise pt at [phone]. Thanks

## 2018-04-07 ENCOUNTER — Ambulatory Visit: Payer: PPO | Admitting: Radiation Oncology

## 2018-04-16 ENCOUNTER — Encounter: Payer: Self-pay | Admitting: Radiation Oncology

## 2018-04-16 ENCOUNTER — Other Ambulatory Visit: Payer: Self-pay

## 2018-04-16 ENCOUNTER — Ambulatory Visit
Admission: RE | Admit: 2018-04-16 | Discharge: 2018-04-16 | Disposition: A | Discharge status: Home / Self Care | Payer: PPO | Source: Ambulatory Visit | Attending: Radiation Oncology | Admitting: Radiation Oncology

## 2018-04-16 ENCOUNTER — Encounter
Admission: RE | Admit: 2018-04-16 | Discharge: 2018-04-16 | Disposition: A | Discharge status: Home / Self Care | Payer: PPO | Source: Ambulatory Visit | Attending: Urology | Admitting: Urology

## 2018-04-16 DIAGNOSIS — Z01818 Encounter for other preprocedural examination: Secondary | ICD-10-CM | POA: Insufficient documentation

## 2018-04-16 DIAGNOSIS — C61 Malignant neoplasm of prostate: Secondary | ICD-10-CM

## 2018-04-16 DIAGNOSIS — I440 Atrioventricular block, first degree: Secondary | ICD-10-CM | POA: Diagnosis not present

## 2018-04-16 DIAGNOSIS — R9431 Abnormal electrocardiogram [ECG] [EKG]: Secondary | ICD-10-CM | POA: Diagnosis not present

## 2018-04-16 DIAGNOSIS — I10 Essential (primary) hypertension: Secondary | ICD-10-CM | POA: Insufficient documentation

## 2018-04-16 HISTORY — DX: Chronic obstructive pulmonary disease, unspecified: J44.9

## 2018-04-16 LAB — CBC
HCT: 41.2 % (ref 39.0–52.0)
Hemoglobin: 13.5 g/dL (ref 13.0–17.0)
MCH: 31 pg (ref 26.0–34.0)
MCHC: 32.8 g/dL (ref 30.0–36.0)
MCV: 94.5 fL (ref 80.0–100.0)
Platelets: 197 10*3/uL (ref 150–400)
RBC: 4.36 MIL/uL (ref 4.22–5.81)
RDW: 12.9 % (ref 11.5–15.5)
WBC: 6.2 10*3/uL (ref 4.0–10.5)
nRBC: 0 % (ref 0.0–0.2)

## 2018-04-16 NOTE — H&P (View-Only) (Signed)
Radiation Oncology Follow up Note  Name: Stephen Madden   Date:   04/16/2018 MRN:  [id number] DOB: 08/21/1950    This 67 y.o. male presents to the clinic today for informed consent regarding I-125 interstitial implant for Gleason 6 adenocarcinoma the prostate.  REFERRING PROVIDER: Cletis Athens, MD  HPI: patient is a 67 year old male was originally consult back in August when he had an elevated PSA of 4.5 ultrasound-guided biopsy showing 3 of 12 cores positive for Gleason 6 adenocarcinoma. He was evaluated by radiation oncology has waited was time to agreed to go ahead with I-125 interstitial implant. He needs general anesthesia for volume study. He is otherwise doing well has some mild elevated lower urinary tract symptoms..  COMPLICATIONS OF TREATMENT: none  FOLLOW UP COMPLIANCE: keeps appointments   PHYSICAL EXAM:  BP (P) 121/80 (BP Location: Right Arm, Patient Position: Sitting)   Pulse (P) 68   Temp (!) (P) 96.4 F (35.8 C) (Tympanic)   Wt (P) 200 lb 6.4 oz (90.9 kg)   BMI (P) 24.08 kg/m  Well-developed well-nourished patient in NAD. HEENT reveals PERLA, EOMI, discs not visualized.  Oral cavity is clear. No oral mucosal lesions are identified. Neck is clear without evidence of cervical or supraclavicular adenopathy. Lungs are clear to A&P. Cardiac examination is essentially unremarkable with regular rate and rhythm without murmur rub or thrill. Abdomen is benign with no organomegaly or masses noted. Motor sensory and DTR levels are equal and symmetric in the upper and lower extremities. Cranial nerves II through XII are grossly intact. Proprioception is intact. No peripheral adenopathy or edema is identified. No motor or sensory levels are noted. Crude visual fields are within normal range.  RADIOLOGY RESULTS: no current films for review  PLAN: at this time I got over risks and benefits of radiation therapy with I-125 interstitial implant including radiation safety precautions.  Patient comprehends my treatment plan well. Informed consent was signed. We'll do a history and physical performed within 30 days of his implant. He is a rescheduled for volume study under general anesthesia. Patient wife both comprehend my treatment plan well.  I would like to take this opportunity to thank you for allowing me to participate in the care of your patient.Noreene Filbert, MD

## 2018-04-16 NOTE — Patient Instructions (Addendum)
Your procedure is scheduled on: Tuesday 04/29/18 Report to [facility]. To find out your arrival time please call [phone] between 1PM - 3PM on Monday 04/28/18.  Remember: Instructions that are not followed completely may result in serious medical risk, up to and including death, or upon the discretion of your surgeon and anesthesiologist your surgery may need to be rescheduled.     _X__ 1. Do not eat food after midnight the night before your procedure.                 No gum chewing or hard candies. You may drink clear liquids up to 2 hours                 before you are scheduled to arrive for your surgery- DO not drink clear                 liquids within 2 hours of the start of your surgery.                 Clear Liquids include:  water, apple juice without pulp, clear carbohydrate                 drink such as Clearfast or Gatorade, Black Coffee or Tea (Do not add                 anything to coffee or tea).  __X__2.  On the morning of surgery brush your teeth with toothpaste and water, you                 may rinse your mouth with mouthwash if you wish.  Do not swallow any              toothpaste of mouthwash.     _X__ 3.  No Alcohol for 24 hours before or after surgery.   _X__ 4.  Do Not Smoke or use e-cigarettes For 24 Hours Prior to Your Surgery.                 Do not use any chewable tobacco products for at least 6 hours prior to                 surgery.  ____  5.  Bring all medications with you on the day of surgery if instructed.   __X__  6.  Notify your doctor if there is any change in your medical condition      (cold, fever, infections).     Do not wear jewelry, make-up, hairpins, clips or nail polish. Do not wear lotions, powders, or perfumes.  Do not shave 48 hours prior to surgery. Men may shave face and neck. Do not bring valuables to the hospital.    [facility] is not responsible for any belongings or  valuables.  Contacts, dentures/partials or body piercings may not be worn into surgery. Bring a case for your contacts, glasses or hearing aids, a denture cup will be supplied. Leave your suitcase in the car. After surgery it may be brought to your room. For patients admitted to the hospital, discharge time is determined by your treatment team.   Patients discharged the day of surgery will not be allowed to drive home.   Please read over the following fact sheets that you were given:   MRSA Information  __X__ Take these medicines the morning of surgery with A SIP OF WATER:  1. atenolol (TENORMIN)   2. NIFEdipine (PROCARDIA-XL/ADALAT CC)   3.   4.  5.  6.  ____ Fleet Enema (as directed)   ____ Use CHG Soap/SAGE wipes as directed  __X__ Use inhalers on the day of surgery  ____ Stop metformin/Janumet/Farxiga 2 days prior to surgery    ____ Take 1/2 of usual insulin dose the night before surgery. No insulin the morning          of surgery.   ____ Stop Blood Thinners Coumadin/Plavix/Xarelto/Pleta/Pradaxa/Eliquis/Effient/Aspirin  on   Or contact your Surgeon, Cardiologist or Medical Doctor regarding  ability to stop your blood thinners  __X__ Stop Anti-inflammatories 7 days before surgery such as Advil, Ibuprofen, Motrin,  BC or Goodies Powder, Naprosyn, Naproxen, Aleve, Aspirin    __X__ Stop all herbal supplements, fish oil or vitamin E until after surgery.    ____ Bring C-Pap to the hospital.      USE THESE SAME INSTRUCTIONS FOR [place] 05/27/18 (PROSTATE SEEDING) YOU WILL NEED TO USE THE ENEMA PROVIDED PRIOR TO ARRIVAL TO [place] THAT MORNING

## 2018-04-16 NOTE — Progress Notes (Signed)
Radiation Oncology Follow up Note  Name: Stephen Madden   Date:   04/16/2018 MRN:  [id number] DOB: 09-14-1950    This 67 y.o. male presents to the clinic today for informed consent regarding I-125 interstitial implant for Gleason 6 adenocarcinoma the prostate.  REFERRING PROVIDER: Cletis Athens, MD  HPI: patient is a 67 year old male was originally consult back in August when he had an elevated PSA of 4.5 ultrasound-guided biopsy showing 3 of 12 cores positive for Gleason 6 adenocarcinoma. He was evaluated by radiation oncology has waited was time to agreed to go ahead with I-125 interstitial implant. He needs general anesthesia for volume study. He is otherwise doing well has some mild elevated lower urinary tract symptoms..  COMPLICATIONS OF TREATMENT: none  FOLLOW UP COMPLIANCE: keeps appointments   PHYSICAL EXAM:  BP (P) 121/80 (BP Location: Right Arm, Patient Position: Sitting)   Pulse (P) 68   Temp (!) (P) 96.4 F (35.8 C) (Tympanic)   Wt (P) 200 lb 6.4 oz (90.9 kg)   BMI (P) 24.08 kg/m  Well-developed well-nourished patient in NAD. HEENT reveals PERLA, EOMI, discs not visualized.  Oral cavity is clear. No oral mucosal lesions are identified. Neck is clear without evidence of cervical or supraclavicular adenopathy. Lungs are clear to A&P. Cardiac examination is essentially unremarkable with regular rate and rhythm without murmur rub or thrill. Abdomen is benign with no organomegaly or masses noted. Motor sensory and DTR levels are equal and symmetric in the upper and lower extremities. Cranial nerves II through XII are grossly intact. Proprioception is intact. No peripheral adenopathy or edema is identified. No motor or sensory levels are noted. Crude visual fields are within normal range.  RADIOLOGY RESULTS: no current films for review  PLAN: at this time I got over risks and benefits of radiation therapy with I-125 interstitial implant including radiation safety precautions.  Patient comprehends my treatment plan well. Informed consent was signed. We'll do a history and physical performed within 30 days of his implant. He is a rescheduled for volume study under general anesthesia. Patient wife both comprehend my treatment plan well.  I would like to take this opportunity to thank you for allowing me to participate in the care of your patient.Noreene Filbert, MD

## 2018-04-26 DIAGNOSIS — J939 Pneumothorax, unspecified: Secondary | ICD-10-CM | POA: Diagnosis not present

## 2018-04-26 DIAGNOSIS — J9383 Other pneumothorax: Secondary | ICD-10-CM | POA: Diagnosis not present

## 2018-04-26 DIAGNOSIS — J449 Chronic obstructive pulmonary disease, unspecified: Secondary | ICD-10-CM | POA: Diagnosis not present

## 2018-04-28 ENCOUNTER — Ambulatory Visit: Payer: PPO | Admitting: Anesthesiology

## 2018-04-29 ENCOUNTER — Encounter
Admission: RE | Disposition: A | Discharge status: Home / Self Care | Payer: Self-pay | Source: Ambulatory Visit | Attending: Urology

## 2018-04-29 ENCOUNTER — Ambulatory Visit: Payer: PPO | Attending: Radiation Oncology

## 2018-04-29 ENCOUNTER — Ambulatory Visit: Payer: PPO | Admitting: Radiation Oncology

## 2018-04-29 ENCOUNTER — Ambulatory Visit: Payer: PPO

## 2018-04-29 ENCOUNTER — Ambulatory Visit
Admission: RE | Admit: 2018-04-29 | Discharge: 2018-04-29 | Disposition: A | Discharge status: Home / Self Care | Payer: PPO | Source: Ambulatory Visit | Attending: Urology | Admitting: Urology

## 2018-04-29 ENCOUNTER — Other Ambulatory Visit: Payer: Self-pay

## 2018-04-29 DIAGNOSIS — Z87891 Personal history of nicotine dependence: Secondary | ICD-10-CM | POA: Insufficient documentation

## 2018-04-29 DIAGNOSIS — Z539 Procedure and treatment not carried out, unspecified reason: Secondary | ICD-10-CM | POA: Insufficient documentation

## 2018-04-29 DIAGNOSIS — J449 Chronic obstructive pulmonary disease, unspecified: Secondary | ICD-10-CM | POA: Diagnosis not present

## 2018-04-29 DIAGNOSIS — I10 Essential (primary) hypertension: Secondary | ICD-10-CM | POA: Diagnosis not present

## 2018-04-29 DIAGNOSIS — C61 Malignant neoplasm of prostate: Secondary | ICD-10-CM | POA: Diagnosis not present

## 2018-04-29 HISTORY — DX: Pneumothorax, unspecified: J93.9

## 2018-04-29 SURGERY — ULTRASOUND, PROSTATE, FOR VOLUME DETERMINATION
Anesthesia: General

## 2018-04-29 MED ORDER — LACTATED RINGERS IV SOLN
INTRAVENOUS | Status: DC
  Administered 2018-04-29: 06:00:00 via INTRAVENOUS

## 2018-04-29 MED ORDER — FAMOTIDINE 20 MG PO TABS
ORAL_TABLET | ORAL | Status: AC
  Administered 2018-04-29: 20 mg via ORAL
  Filled 2018-04-29: qty 1

## 2018-04-29 MED ORDER — FAMOTIDINE 20 MG PO TABS
20.0000 mg | ORAL_TABLET | Freq: Once | ORAL | Status: AC
  Administered 2018-04-29: 20 mg via ORAL

## 2018-04-29 NOTE — OR Nursing (Signed)
Dr Bernardo Heater in to see patient and discussed plano of care. Scheduled procedure today cancelled because of OR equipment issues.  MD office will contact patient to reschedule.

## 2018-04-29 NOTE — Anesthesia Preprocedure Evaluation (Signed)
Anesthesia Evaluation  Patient identified by MRN, date of birth, ID band Patient awake    Reviewed: Allergy & Precautions, H&P , NPO status , Patient's Chart, lab work & pertinent test results  History of Anesthesia Complications Negative for: history of anesthetic complications  Airway Mallampati: II  TM Distance: >3 FB Neck ROM: limited    Dental  (+) Poor Dentition, Chipped, Missing, Caps   Pulmonary neg shortness of breath, COPD, Current Smoker, former smoker,    Pulmonary exam normal breath sounds clear to auscultation       Cardiovascular Exercise Tolerance: Good hypertension, (-) angina(-) Past MI and (-) DOE Normal cardiovascular exam Rhythm:regular Rate:Normal     Neuro/Psych negative neurological ROS  negative psych ROS   GI/Hepatic negative GI ROS, Neg liver ROS, neg GERD  ,  Endo/Other  negative endocrine ROS  Renal/GU Renal disease  negative genitourinary   Musculoskeletal   Abdominal   Peds  Hematology negative hematology ROS (+)   Anesthesia Other Findings Past Medical History: No date: BPH with obstruction/lower urinary tract sympt* No date: Elevated PSA No date: Hypertension No date: Tobacco abuse  Past Surgical History: 05/30/2015: COLONOSCOPY WITH PROPOFOL N/A     Comment: Procedure: COLONOSCOPY WITH PROPOFOL;                Surgeon: Lucilla Lame, MD;  Location: [place];  Service: Endoscopy;  Laterality:              N/A; 07/11/2015: CYSTOSCOPY W/ RETROGRADES Bilateral     Comment: Procedure: CYSTOSCOPY WITH RETROGRADE               PYELOGRAM;  Surgeon: Hollice Espy, MD;                Location: [facility];  Service: Urology;                Laterality: Bilateral; 07/11/2015: CYSTOSCOPY WITH STENT PLACEMENT Bilateral     Comment: Procedure: CYSTOSCOPY WITH STENT PLACEMENT;                Surgeon: Hollice Espy, MD;  Location: [facility]               [facility];  Service:  Urology;  Laterality: Bilateral; 07/11/2015: LAPAROSCOPIC PARTIAL COLECTOMY N/A     Comment: Procedure: Laparoscopic right hemicolectomy;                Surgeon: Hubbard Robinson, MD;  Location:               [facility];  Service: General;  Laterality: N/A; 05/30/2015: POLYPECTOMY     Comment: Procedure: POLYPECTOMY;  Surgeon: Lucilla Lame,              MD;  Location: [place];  Service:               Endoscopy;;  BMI    Body Mass Index:  22.71 kg/m      Reproductive/Obstetrics negative OB ROS                             Anesthesia Physical  Anesthesia Plan  ASA: III  Anesthesia Plan: General   Post-op Pain Management:    Induction:   PONV Risk Score and Plan: TIVA  Airway Management Planned: Nasal Cannula  Additional Equipment:   Intra-op Plan:  Post-operative Plan:   Informed Consent: I have reviewed the patients History and Physical, chart, labs and discussed the procedure including the risks, benefits and alternatives for the proposed anesthesia with the patient or authorized representative who has indicated his/her understanding and acceptance.   Dental Advisory Given  Plan Discussed with: Anesthesiologist, CRNA and Surgeon  Anesthesia Plan Comments:         Anesthesia Quick Evaluation

## 2018-04-29 NOTE — Interval H&P Note (Signed)
History and Physical Interval Note:  04/29/2018 7:34 AM  Stephen Madden  has presented today for surgery, with the diagnosis of Prostate cancer  The various methods of treatment have been discussed with the patient and family. After consideration of risks, benefits and other options for treatment, the patient has consented to  Procedure(s): VOLUME STUDY (N/A) as a surgical intervention .  The patient's history has been reviewed, patient examined, no change in status, stable for surgery.  I have reviewed the patient's chart and labs.  Questions were answered to the patient's satisfaction.     [place]

## 2018-04-30 ENCOUNTER — Other Ambulatory Visit: Payer: Self-pay | Admitting: Radiology

## 2018-05-02 ENCOUNTER — Other Ambulatory Visit: Payer: Self-pay | Admitting: Radiology

## 2018-05-02 DIAGNOSIS — C61 Malignant neoplasm of prostate: Secondary | ICD-10-CM

## 2018-05-12 ENCOUNTER — Other Ambulatory Visit: Payer: Self-pay

## 2018-05-12 ENCOUNTER — Encounter: Payer: Self-pay | Admitting: Cardiothoracic Surgery

## 2018-05-12 ENCOUNTER — Emergency Department: Payer: PPO

## 2018-05-12 ENCOUNTER — Ambulatory Visit: Payer: PPO | Admitting: Radiation Oncology

## 2018-05-12 ENCOUNTER — Inpatient Hospital Stay: Payer: PPO

## 2018-05-12 ENCOUNTER — Telehealth: Payer: Self-pay | Admitting: Cardiothoracic Surgery

## 2018-05-12 ENCOUNTER — Inpatient Hospital Stay
Admission: EM | Admit: 2018-05-12 | Discharge: 2018-05-20 | DRG: 166 | Disposition: A | Discharge status: Home / Self Care | Payer: PPO | Attending: Cardiothoracic Surgery | Admitting: Cardiothoracic Surgery

## 2018-05-12 ENCOUNTER — Encounter: Payer: Self-pay | Admitting: Emergency Medicine

## 2018-05-12 ENCOUNTER — Ambulatory Visit (INDEPENDENT_AMBULATORY_CARE_PROVIDER_SITE_OTHER): Payer: PPO | Admitting: Cardiothoracic Surgery

## 2018-05-12 ENCOUNTER — Ambulatory Visit
Admission: RE | Admit: 2018-05-12 | Discharge: 2018-05-12 | Disposition: A | Discharge status: Home / Self Care | Payer: PPO | Source: Ambulatory Visit | Attending: Cardiothoracic Surgery | Admitting: Cardiothoracic Surgery

## 2018-05-12 ENCOUNTER — Ambulatory Visit
Admission: RE | Admit: 2018-05-12 | Discharge: 2018-05-12 | Disposition: A | Discharge status: Home / Self Care | Payer: PPO | Source: Home / Self Care | Attending: Cardiothoracic Surgery | Admitting: Cardiothoracic Surgery

## 2018-05-12 ENCOUNTER — Other Ambulatory Visit: Payer: Self-pay | Admitting: Cardiothoracic Surgery

## 2018-05-12 VITALS — BP 136/91 | HR 86 | Temp 97.3°F | Ht 76.5 in | Wt 201.4 lb

## 2018-05-12 DIAGNOSIS — K5900 Constipation, unspecified: Secondary | ICD-10-CM | POA: Diagnosis present

## 2018-05-12 DIAGNOSIS — Z8249 Family history of ischemic heart disease and other diseases of the circulatory system: Secondary | ICD-10-CM | POA: Diagnosis not present

## 2018-05-12 DIAGNOSIS — T859XXA Unspecified complication of internal prosthetic device, implant and graft, initial encounter: Secondary | ICD-10-CM

## 2018-05-12 DIAGNOSIS — Z87891 Personal history of nicotine dependence: Secondary | ICD-10-CM | POA: Diagnosis not present

## 2018-05-12 DIAGNOSIS — J9811 Atelectasis: Secondary | ICD-10-CM | POA: Diagnosis not present

## 2018-05-12 DIAGNOSIS — J431 Panlobular emphysema: Secondary | ICD-10-CM | POA: Diagnosis not present

## 2018-05-12 DIAGNOSIS — R0902 Hypoxemia: Secondary | ICD-10-CM

## 2018-05-12 DIAGNOSIS — J986 Disorders of diaphragm: Secondary | ICD-10-CM | POA: Diagnosis not present

## 2018-05-12 DIAGNOSIS — R0602 Shortness of breath: Secondary | ICD-10-CM | POA: Diagnosis not present

## 2018-05-12 DIAGNOSIS — N401 Enlarged prostate with lower urinary tract symptoms: Secondary | ICD-10-CM | POA: Diagnosis present

## 2018-05-12 DIAGNOSIS — Z823 Family history of stroke: Secondary | ICD-10-CM | POA: Diagnosis not present

## 2018-05-12 DIAGNOSIS — J9383 Other pneumothorax: Secondary | ICD-10-CM

## 2018-05-12 DIAGNOSIS — J939 Pneumothorax, unspecified: Secondary | ICD-10-CM

## 2018-05-12 DIAGNOSIS — J432 Centrilobular emphysema: Secondary | ICD-10-CM | POA: Diagnosis not present

## 2018-05-12 DIAGNOSIS — Z09 Encounter for follow-up examination after completed treatment for conditions other than malignant neoplasm: Secondary | ICD-10-CM

## 2018-05-12 DIAGNOSIS — J438 Other emphysema: Secondary | ICD-10-CM | POA: Diagnosis not present

## 2018-05-12 DIAGNOSIS — J9601 Acute respiratory failure with hypoxia: Secondary | ICD-10-CM | POA: Diagnosis not present

## 2018-05-12 DIAGNOSIS — J449 Chronic obstructive pulmonary disease, unspecified: Secondary | ICD-10-CM | POA: Diagnosis not present

## 2018-05-12 DIAGNOSIS — J9381 Chronic pneumothorax: Secondary | ICD-10-CM | POA: Diagnosis not present

## 2018-05-12 DIAGNOSIS — R05 Cough: Secondary | ICD-10-CM | POA: Diagnosis not present

## 2018-05-12 DIAGNOSIS — I10 Essential (primary) hypertension: Secondary | ICD-10-CM | POA: Diagnosis present

## 2018-05-12 DIAGNOSIS — J9600 Acute respiratory failure, unspecified whether with hypoxia or hypercapnia: Secondary | ICD-10-CM | POA: Diagnosis not present

## 2018-05-12 DIAGNOSIS — J90 Pleural effusion, not elsewhere classified: Secondary | ICD-10-CM | POA: Diagnosis not present

## 2018-05-12 DIAGNOSIS — Z4682 Encounter for fitting and adjustment of non-vascular catheter: Secondary | ICD-10-CM | POA: Diagnosis not present

## 2018-05-12 DIAGNOSIS — N289 Disorder of kidney and ureter, unspecified: Secondary | ICD-10-CM | POA: Diagnosis not present

## 2018-05-12 DIAGNOSIS — J439 Emphysema, unspecified: Secondary | ICD-10-CM | POA: Diagnosis not present

## 2018-05-12 LAB — COMPREHENSIVE METABOLIC PANEL
ALT: 15 U/L (ref 0–44)
AST: 18 U/L (ref 15–41)
Albumin: 4.1 g/dL (ref 3.5–5.0)
Alkaline Phosphatase: 64 U/L (ref 38–126)
Anion gap: 6 (ref 5–15)
BUN: 20 mg/dL (ref 8–23)
CO2: 27 mmol/L (ref 22–32)
Calcium: 8.9 mg/dL (ref 8.9–10.3)
Chloride: 108 mmol/L (ref 98–111)
Creatinine, Ser: 1.06 mg/dL (ref 0.61–1.24)
GFR calc Af Amer: >60 mL/min (ref >60)
GFR calc non Af Amer: >60 mL/min (ref >60)
GLUCOSE: 115 mg/dL — AB (ref 70–99)
Potassium: 4 mmol/L (ref 3.5–5.1)
Sodium: 141 mmol/L (ref 135–145)
Total Bilirubin: 0.8 mg/dL (ref 0.3–1.2)
Total Protein: 6.9 g/dL (ref 6.5–8.1)

## 2018-05-12 LAB — CBC
HCT: 46.2 % (ref 39.0–52.0)
HEMATOCRIT: 40.8 % (ref 39.0–52.0)
HEMOGLOBIN: 15 g/dL (ref 13.0–17.0)
Hemoglobin: 13.4 g/dL (ref 13.0–17.0)
MCH: 30.4 pg (ref 26.0–34.0)
MCH: 30.7 pg (ref 26.0–34.0)
MCHC: 32.5 g/dL (ref 30.0–36.0)
MCHC: 32.8 g/dL (ref 30.0–36.0)
MCV: 93.7 fL (ref 80.0–100.0)
MCV: 93.6 fL (ref 80.0–100.0)
Platelets: 228 10*3/uL (ref 150–400)
Platelets: 195 10*3/uL (ref 150–400)
RBC: 4.93 MIL/uL (ref 4.22–5.81)
RBC: 4.36 MIL/uL (ref 4.22–5.81)
RDW: 12.9 % (ref 11.5–15.5)
RDW: 12.9 % (ref 11.5–15.5)
WBC: 6.9 10*3/uL (ref 4.0–10.5)
WBC: 7.3 10*3/uL (ref 4.0–10.5)
nRBC: 0 % (ref 0.0–0.2)
nRBC: 0 % (ref 0.0–0.2)

## 2018-05-12 LAB — APTT: aPTT: 31 seconds (ref 24–36)

## 2018-05-12 LAB — BASIC METABOLIC PANEL
Anion gap: 9 (ref 5–15)
BUN: 19 mg/dL (ref 8–23)
CO2: 27 mmol/L (ref 22–32)
Calcium: 9.4 mg/dL (ref 8.9–10.3)
Chloride: 105 mmol/L (ref 98–111)
Creatinine, Ser: 1.21 mg/dL (ref 0.61–1.24)
GFR calc Af Amer: >60 mL/min (ref >60)
GFR calc non Af Amer: >60 mL/min (ref >60)
GLUCOSE: 108 mg/dL — AB (ref 70–99)
Potassium: 3.5 mmol/L (ref 3.5–5.1)
Sodium: 141 mmol/L (ref 135–145)

## 2018-05-12 LAB — PROTIME-INR
INR: 1.02
INR: 1.08
Prothrombin Time: 13.3 seconds (ref 11.4–15.2)
Prothrombin Time: 13.9 seconds (ref 11.4–15.2)

## 2018-05-12 MED ORDER — OXYCODONE-ACETAMINOPHEN 7.5-325 MG PO TABS
1.0000 | ORAL_TABLET | Freq: Four times a day (QID) | ORAL | Status: DC | PRN
  Administered 2018-05-12: 1 via ORAL
  Filled 2018-05-12: qty 1

## 2018-05-12 MED ORDER — IOHEXOL 300 MG/ML  SOLN
75.0000 mL | Freq: Once | INTRAMUSCULAR | Status: AC | PRN
  Administered 2018-05-12: 75 mL via INTRAVENOUS

## 2018-05-12 MED ORDER — FLUTICASONE FUROATE-VILANTEROL 100-25 MCG/INH IN AEPB
1.0000 | INHALATION_SPRAY | Freq: Every day | RESPIRATORY_TRACT | Status: DC
  Filled 2018-05-12: qty 28

## 2018-05-12 MED ORDER — NIFEDIPINE ER OSMOTIC RELEASE 30 MG PO TB24
30.0000 mg | ORAL_TABLET | Freq: Every day | ORAL | Status: DC
  Administered 2018-05-13 – 2018-05-20 (×8): 30 mg via ORAL
  Filled 2018-05-12 (×9): qty 1

## 2018-05-12 MED ORDER — KCL IN DEXTROSE-NACL 20-5-0.45 MEQ/L-%-% IV SOLN
INTRAVENOUS | Status: DC
  Administered 2018-05-12 – 2018-05-15 (×3): via INTRAVENOUS
  Filled 2018-05-12 (×5): qty 1000

## 2018-05-12 MED ORDER — LORAZEPAM 2 MG/ML IJ SOLN
2.0000 mg | Freq: Once | INTRAMUSCULAR | Status: AC
  Administered 2018-05-12: 2 mg via INTRAVENOUS
  Filled 2018-05-12: qty 1

## 2018-05-12 MED ORDER — OXYCODONE-ACETAMINOPHEN 7.5-325 MG PO TABS
1.0000 | ORAL_TABLET | Freq: Four times a day (QID) | ORAL | Status: DC | PRN
  Administered 2018-05-12 – 2018-05-13 (×2): 2 via ORAL
  Filled 2018-05-12 (×2): qty 2

## 2018-05-12 MED ORDER — SODIUM CHLORIDE 0.9% FLUSH
3.0000 mL | Freq: Two times a day (BID) | INTRAVENOUS | Status: DC
  Administered 2018-05-13 – 2018-05-17 (×8): 3 mL via INTRAVENOUS

## 2018-05-12 MED ORDER — LATANOPROST 0.005 % OP SOLN
1.0000 [drp] | Freq: Every day | OPHTHALMIC | Status: DC
  Administered 2018-05-15: 1 [drp] via OPHTHALMIC
  Filled 2018-05-12: qty 2.5

## 2018-05-12 MED ORDER — ATENOLOL 50 MG PO TABS
50.0000 mg | ORAL_TABLET | Freq: Every day | ORAL | Status: DC
  Administered 2018-05-13 – 2018-05-20 (×7): 50 mg via ORAL
  Filled 2018-05-12 (×3): qty 1
  Filled 2018-05-12: qty 2
  Filled 2018-05-12 (×4): qty 1

## 2018-05-12 MED ORDER — LIDOCAINE-EPINEPHRINE (PF) 2 %-1:200000 IJ SOLN
20.0000 mL | Freq: Once | INTRAMUSCULAR | Status: AC
  Administered 2018-05-12: 20 mL
  Filled 2018-05-12: qty 20

## 2018-05-12 MED ORDER — FENTANYL CITRATE (PF) 100 MCG/2ML IJ SOLN
INTRAMUSCULAR | Status: AC
  Administered 2018-05-12: 50 ug
  Filled 2018-05-12: qty 2

## 2018-05-12 MED ORDER — LORAZEPAM 2 MG/ML IJ SOLN
INTRAMUSCULAR | Status: AC
  Administered 2018-05-12: 1 mg
  Filled 2018-05-12: qty 1

## 2018-05-12 NOTE — Progress Notes (Signed)
Patient c/o significant pain with increasing intensity. Patient anxious because this "pain is worse than the last time I had this."  Md notified who increased dose to 1-2 tabs prn. MD states that there is another x ray scheduled for 0600 and that patient will be NPO after midnight in case there is indication for a procedure. Informed patient of this. No further intervention needed, will continue to monitor.

## 2018-05-12 NOTE — ED Provider Notes (Signed)
[facility] Emergency Department Provider Note   ____________________________________________   First MD Initiated Contact with Patient 05/12/18 1058     (approximate)  I have reviewed the triage vital signs and the nursing notes.   HISTORY  Chief Complaint Shortness of Breath    HPI Stephen Madden is a 67 y.o. male presents for evaluation of shortness of breath over the last 4 days but a recent cough and cold symptoms for about 2 weeks.  Reports he went to Dr. Faith Rogue office because the shortness of breath reminded of when he had a collapsed lung before.  They sent him here and told him he had a collapsed lung and needed to be admitted  He denies chest pain, but reports he feels that shortness of breath is more noticeable when he is walking is been fairly consistent for the last few days may be a little bit worse this morning.  Not really short of breath too much at rest, but some.  Definitely worse when he tries to walk.  No abdominal pain.  No shortness of breath.  No fevers.  Pain is not present.  Does have a history of prior pneumothorax.  Prior chest tube.   Past Medical History:  Diagnosis Date  . BPH with obstruction/lower urinary tract symptoms   . COPD (chronic obstructive pulmonary disease) ([place])   . Elevated PSA   . Hypertension   . Pneumothorax 02/23/2018   left  . Tobacco abuse     Patient Active Problem List   Diagnosis Date Noted  . Recurrent spontaneous pneumothorax 05/12/2018  . Pneumothorax on left 02/24/2018  . Hx of colonic polyps   . Benign neoplasm of rectosigmoid junction   . Polyp of sigmoid colon   . History of elevated PSA 08/11/2015  . Erectile dysfunction of organic origin 08/11/2015  . Acute kidney injury ([place])   . S/P partial colectomy 07/11/2015  . Special screening for malignant neoplasms, colon   . Benign neoplasm of transverse colon   . Benign neoplasm of descending colon   . Benign neoplasm of sigmoid colon     . Rectal polyp   . Elevated PSA 01/10/2015  . BPH with obstruction/lower urinary tract symptoms 01/10/2015    Past Surgical History:  Procedure Laterality Date  . COLON SURGERY    . COLONOSCOPY WITH PROPOFOL N/A 05/30/2015   Procedure: COLONOSCOPY WITH PROPOFOL;  Surgeon: Lucilla Lame, MD;  Location: [place];  Service: Endoscopy;  Laterality: N/A;  . COLONOSCOPY WITH PROPOFOL N/A 08/28/2016   Procedure: COLONOSCOPY WITH PROPOFOL;  Surgeon: Lucilla Lame, MD;  Location: [facility];  Service: Endoscopy;  Laterality: N/A;  . CYSTOSCOPY W/ RETROGRADES Bilateral 07/11/2015   Procedure: CYSTOSCOPY WITH RETROGRADE PYELOGRAM;  Surgeon: Hollice Espy, MD;  Location: [facility];  Service: Urology;  Laterality: Bilateral;  . CYSTOSCOPY WITH STENT PLACEMENT Bilateral 07/11/2015   Procedure: CYSTOSCOPY WITH STENT PLACEMENT;  Surgeon: Hollice Espy, MD;  Location: [facility];  Service: Urology;  Laterality: Bilateral;  . LAPAROSCOPIC PARTIAL COLECTOMY N/A 07/11/2015   Procedure: Laparoscopic right hemicolectomy;  Surgeon: Hubbard Robinson, MD;  Location: [facility];  Service: General;  Laterality: N/A;  . POLYPECTOMY  05/30/2015   Procedure: POLYPECTOMY;  Surgeon: Lucilla Lame, MD;  Location: [place];  Service: Endoscopy;;    Prior to Admission medications   Medication Sig Start Date End Date Taking? Authorizing Provider  albuterol (PROVENTIL HFA;VENTOLIN HFA) 108 (90 Base) MCG/ACT inhaler Inhale 2 puffs into the  lungs every 4 (four) hours as needed for wheezing or shortness of breath.    [provider]  atenolol (TENORMIN) 50 MG tablet Take 50 mg by mouth every morning.     [provider]  clotrimazole-betamethasone (LOTRISONE) cream Apply 1 application topically 2 (two) times daily as needed (for irritation).    [provider]  dextromethorphan-guaiFENesin (MUCINEX DM) 30-600 MG 12hr tablet Take 1 tablet by mouth 2 (two) times daily.    [provider]  fluticasone furoate-vilanterol (BREO ELLIPTA) 200-25 MCG/INH AEPB Inhale 1 puff into the lungs daily as needed (for shortness of breath or wheezing).    [provider]  latanoprost (XALATAN) 0.005 % ophthalmic solution Place 1 drop into both eyes at bedtime.    [provider]  NIFEdipine (PROCARDIA-XL/ADALAT CC) 30 MG 24 hr tablet Take 30 mg by mouth every morning.     [provider]    Allergies Patient has no known allergies.  Family History  Problem Relation Age of Onset  . Heart disease Mother   . Stroke Father   . Cancer Neg Hx   . Kidney disease Neg Hx   . Prostate cancer Neg Hx   . Bladder Cancer Neg Hx     Social History Social History   Tobacco Use  . Smoking status: Former Smoker    Packs/day: 1.00    Years: 30.00    Pack years: 30.00    Types: Cigarettes    Last attempt to quit: 11/19/2016    Years since quitting: 1.4  . Smokeless tobacco: Never Used  Substance Use Topics  . Alcohol use: Not Currently    Alcohol/week: 0.0 standard drinks    Comment: some during week  . Drug use: No    Review of Systems Constitutional: No fever/chills Eyes: No visual changes. ENT: No sore throat. Cardiovascular: Denies chest pain. Respiratory: See HPI Gastrointestinal: No abdominal pain.   Genitourinary: Negative for dysuria. Musculoskeletal: Negative for back pain. Skin: Negative for rash. Neurological: Negative for headaches, areas of focal weakness or numbness.    ____________________________________________   PHYSICAL EXAM:  VITAL SIGNS: ED Triage Vitals  Enc Vitals Group     BP 05/12/18 1055 (!) 152/107     Pulse Rate 05/12/18 1055 95     Resp 05/12/18 1055 (!) 26     Temp --      Temp src --      SpO2 05/12/18 1055 (!) 88 %     Weight 05/12/18 1050 201 lb 6.4 oz (91.4 kg)     Height 05/12/18 1050 6' 4.5" (1.943 m)     Head Circumference --      Peak Flow --      Pain Score 05/12/18 1049 0     Pain Loc --       Pain Edu? --      Excl. in [place]? --     Constitutional: Alert and oriented.  Appears just slightly dyspneic.  He is in no acute distress. Eyes: Conjunctivae are normal. Head: Atraumatic. Nose: No congestion/rhinnorhea. Mouth/Throat: Mucous membranes are moist. Neck: No stridor.  Cardiovascular: Normal rate, regular rhythm. Grossly normal heart sounds.  Good peripheral circulation. Respiratory: Some very slight tachypnea.  No use of accessory muscles.  Appears minimally dyspneic.  Diminished left-sided lung sounds, right-sided lung sounds are clear.  Midline trachea.  No JVD.  No crackles or wheezing. Gastrointestinal: Soft and nontender. No distention. Musculoskeletal: No lower extremity tenderness nor edema. Neurologic:  Normal speech and language. No gross focal neurologic deficits are appreciated.  Skin:  Skin is warm, dry and intact. No rash noted. Psychiatric: Mood and affect are normal. Speech and behavior are normal.  ____________________________________________   LABS (all labs ordered are listed, but only abnormal results are displayed)  Labs Reviewed  BASIC METABOLIC PANEL - Abnormal; Notable for the following components:      Result Value   Glucose, Bld 108 (*)    All other components within normal limits  CBC  PROTIME-INR   ____________________________________________  EKG  Reviewed enterotomy at 1130 Heart rate 75 QRS 90 QTc 430 Sinus rhythm, no evidence of acute ischemia. ____________________________________________  RADIOLOGY  Both chest x-rays personally viewed by me, pneumothorax apparent on both, but improved on the post thoracostomy film.  Additionally both films are discussed with Dr. Graciela Husbands.  Dg Chest 2 View  Result Date: 05/12/2018 CLINICAL DATA:  Cough, shortness of breath EXAM: CHEST - 2 VIEW COMPARISON:  03/17/2018 FINDINGS: The lungs are hyperinflated likely secondary to COPD. There is a small left pleural effusion. There is a  moderate-sized left pneumothorax measuring approximately 25%. There is no right pneumothorax. There is no focal consolidation. Stable cardiomediastinal silhouette. The osseous structures are unremarkable. IMPRESSION: Moderate-sized acute left pneumothorax measuring 25%. Small left pleural effusion. Critical Value/emergent results were called by telephone at the time of interpretation on 05/12/2018 at 10:38 am to Dr. Nestor Lewandowsky , who verbally acknowledged these results. Electronically Signed   By: Kathreen Devoid   On: 05/12/2018 10:43   Dg Chest Portable 1 View  Result Date: 05/12/2018 CLINICAL DATA:  Follow-up pneumothorax.  Chest pain. EXAM: PORTABLE CHEST 1 VIEW COMPARISON:  Earlier film, same date. FINDINGS: The cardiac silhouette, mediastinal and hilar contours are within normal limits and stable. New small caliber left-sided chest tube in place and interval decrease in size of the left-sided pneumothorax. The apical portion of the pneumothorax has largely resolved. The basilar component is slightly smaller but still present at approximately 10-15%. The right lung is clear.  No right-sided pneumothorax. IMPRESSION: Interval placement of a small caliber left-sided chest tube with decrease in size of the left-sided pneumothorax as detailed above. Electronically Signed   By: Marijo Sanes M.D.   On: 05/12/2018 12:54     ____________________________________________   PROCEDURES  Procedure(s) performed: Chest tube  CHEST TUBE INSERTION Date/Time: 05/12/2018 12:50 PM Performed by: Delman Kitten, MD Authorized by: Delman Kitten, MD   Consent:    Consent obtained:  Written   Consent given by:  Patient   Risks discussed:  Bleeding, incomplete drainage, nerve damage, pain, infection and damage to surrounding structures (need for further procedures or OR intervention)   Alternatives discussed:  Delayed treatment and observation Pre-procedure details:    Skin preparation:  Betadine and ChloraPrep    Preparation: Patient was prepped and draped in the usual sterile fashion   Sedation:    Sedation type:  Anxiolysis Anesthesia (see MAR for exact dosages):    Anesthesia method:  Local infiltration   Local anesthetic:  Lidocaine 2% WITH epi (10 ml) Procedure details:    Scalpel size:  11   Tube size ([place]): 9.   Ultrasound guidance: no     Tension pneumothorax: no     Tube connected to:  Suction and water seal (20 cm)   Drainage characteristics:  Air only   Suture material:  0 silk   Dressing:  4x4 sterile gauze and petrolatum-impregnated gauze  Critical Care performed: Yes, see critical care note(s)  CRITICAL CARE Performed by: Delman Kitten   Total critical care time: 37 minutes  Critical care time was exclusive of separately billable procedures and treating other patients.  Critical care was necessary to treat or prevent imminent or life-threatening deterioration.  Critical care was time spent personally by me on the following activities: development of treatment plan with patient and/or surrogate as well as nursing, discussions with consultants, evaluation of patient's response to treatment, examination of patient, obtaining history from patient or surrogate, ordering and performing treatments and interventions, ordering and review of laboratory studies, ordering and review of radiographic studies, pulse oximetry and re-evaluation of patient's condition.  Patient presents with acute pneumothorax, associated with hypoxia requiring oxygen supplementation and increased work of breathing.  Patient required intervention including chest tube insertion due to his hypoxia and increased work of breathing and concerns that if continued to worsen could progress to 8 potential tension pneumothorax at the present time is not have tension physiology.  ____________________________________________   INITIAL IMPRESSION / ASSESSMENT AND PLAN / ED COURSE  Pertinent labs & imaging results that  were available during my care of the patient were reviewed by me and considered in my medical decision making (see chart for details).   Signs and symptoms consistent with likely some type of bronchitis or viral illness previous, now likely developed into pneumothorax due to his coughing.  He does have evidence of hypoxia including saturation 88% on room air.  Discussed case with Dr. Genevive Bi who saw him in the office and advises placement of left-sided chest tube today.  I am in agreement with this plan.  Clinical Course as of May 12 1312  Mon May 12, 2018  1129 Patiently verbally consenting to chest tube insertion.  Patient has a left sided pneumothorax.  Discussed risks benefits and complications with the patient.  He is in agreement we will proceed with left-sided thoracostomy after signing consent.Case discussed with Dr. Faith Rogue, Dr. Faith Rogue advises he is unable to see the patient at this time and recommends that ED physician (I) insert chest tube at this time.   [MQ]    Clinical Course User Index [MQ] Delman Kitten, MD    ----------------------------------------- 1:12 PM on 05/12/2018 -----------------------------------------  Patient is doing well at present, does not wish for any pain medication.  He is fully awake and alert.  Discussed plan with patient for admission, updated Dr. Faith Rogue on imaging and Dr. Faith Rogue is placed admitting orders.  Patient appears stable for admission. ____________________________________________   FINAL CLINICAL IMPRESSION(S) / ED DIAGNOSES  Final diagnoses:  Pneumothorax on left  Hypoxia  Shortness of breath        Note:  This document was prepared using Dragon voice recognition software and may include unintentional dictation errors       Delman Kitten, MD 05/12/18 1313

## 2018-05-12 NOTE — ED Notes (Signed)
Pt being numbed at this time by MD Quale. Pt is alert

## 2018-05-12 NOTE — Patient Instructions (Addendum)
Report to the ER now for tube placement for collapsed lung.   Pneumothorax A pneumothorax is commonly called a collapsed lung. It is a condition in which air leaks from a lung and builds up between the thin layer of tissue that covers the lungs (visceral pleura) and the interior wall of the chest cavity (parietal pleura). The air gets trapped outside the lung, between the lung and the chest wall (pleural space). The air takes up space and prevents the lung from fully expanding. This condition sometimes occurs suddenly with no apparent cause. The buildup of air may be small or large. A small pneumothorax may go away on its own. A large pneumothorax will require treatment and hospitalization. What are the causes? This condition may be caused by:  Trauma and injury to the chest wall.  Surgery and other medical procedures.  A complication of an underlying lung problem, especially chronic obstructive pulmonary disease (COPD) or emphysema. Sometimes the cause of this condition is not known. What increases the risk? You are more likely to develop this condition if:  You have an underlying lung problem.  You smoke.  You are 67-60 years old, male, tall, and underweight.  You have a personal or family history of pneumothorax.  You have an eating disorder (anorexia nervosa). This condition can also happen quickly, even in people with no history of lung problems. What are the signs or symptoms? Sometimes a pneumothorax will have no symptoms. When symptoms are present, they can include:  Chest pain.  Shortness of breath.  Increased rate of breathing.  Bluish color to your lips or skin (cyanosis). How is this diagnosed? This condition may be diagnosed by:  A medical history and physical exam.  A chest X-ray, chest CT scan, or ultrasound. How is this treated? Treatment depends on how severe your condition is. The goal of treatment is to remove the extra air and allow your lung to expand  back to its normal size.  For a small pneumothorax: ? No treatment may be needed. ? Extra oxygen is sometimes used to make it go away more quickly.  For a large pneumothorax or a pneumothorax that is causing symptoms, a procedure is done to drain the air from your lungs. To do this, a health care provider may use: ? A needle with a syringe. This is used to suck air from a pleural space where no additional leakage is taking place. ? A chest tube. This is used to suck air where there is ongoing leakage into the pleural space. The chest tube may need to remain in place for several days until the air leak has healed.  In more severe cases, surgery may be needed to repair the damage that is causing the leak.  If you have multiple pneumothorax episodes or have an air leak that will not heal, a procedure called a pleurodesis may be done. A medicine is placed in the pleural space to irritate the tissues around the lung so that the lung will stick to the chest wall, seal any leaks, and stop any buildup of air in that space. If you have an underlying lung problem, severe symptoms, or a large pneumothorax you will usually need to stay in the hospital. Follow these instructions at home: Lifestyle  Do not use any products that contain nicotine or tobacco, such as cigarettes and e-cigarettes. These are major risk factors in pneumothorax. If you need help quitting, ask your health care provider.  Do not lift anything that is  heavier than 10 lb (4.5 kg), or the limit that your health care provider tells you, until he or she says that it is safe.  Avoid activities that take a lot of effort (strenuous) for as long as told by your health care provider.  Return to your normal activities as told by your health care provider. Ask your health care provider what activities are safe for you.  Do not fly in an airplane or scuba dive until your health care provider says it is okay. General instructions  Take  over-the-counter and prescription medicines only as told by your health care provider.  If a cough or pain makes it difficult for you to sleep at night, try sleeping in a semi-upright position in a recliner or by using 2 or 3 pillows.  If you had a chest tube and it was removed, ask your health care provider when you can remove the bandage (dressing). While the dressing is in place, do not allow it to get wet.  Keep all follow-up visits as told by your health care provider. This is important. Contact a health care provider if:  You cough up thick mucus (sputum) that is yellow or green in color.  You were treated with a chest tube, and you have redness, increasing pain, or discharge at the site where it was placed. Get help right away if:  You have increasing chest pain or shortness of breath.  You have a cough that will not go away.  You begin coughing up blood.  You have pain that is getting worse or is not controlled with medicines.  The site where your chest tube was located opens up.  You feel air coming out of the site where the chest tube was placed.  You have a fever or persistent symptoms for more than 2-3 days.  You have a fever and your symptoms suddenly get worse. These symptoms may represent a serious problem that is an emergency. Do not wait to see if the symptoms will go away. Get medical help right away. Call your local emergency services (911 in the [place] Do not drive yourself to the hospital. Summary  A pneumothorax, commonly called a collapsed lung, is a condition in which air leaks from a lung and gets trapped between the lung and the chest wall (pleural space).  The buildup of air may be small or large. A small pneumothorax may go away on its own. A large pneumothorax will require treatment and hospitalization.  Treatment for this condition depends on how severe the pneumothorax is. The goal of treatment is to remove the extra air and allow the lung to expand  back to its normal size. This information is not intended to replace advice given to you by your health care provider. Make sure you discuss any questions you have with your health care provider. Document Released: 05/07/2005 Document Revised: 04/15/2017 Document Reviewed: 04/15/2017 Elsevier Interactive Patient Education  2019 [employer].

## 2018-05-12 NOTE — ED Notes (Signed)
MD at bedside ready for chest tube insertion.

## 2018-05-12 NOTE — Telephone Encounter (Signed)
Patient states he is having shortness of breath. Patient added to schedule. Patient instructed to have chest xray prior to seeing dr.Oaks.

## 2018-05-12 NOTE — ED Notes (Signed)
Pt request no pain medication at this time.

## 2018-05-12 NOTE — Telephone Encounter (Signed)
Patient called and talked to the answering service and said he was having some shortness in breath. Please call patient and advise.

## 2018-05-12 NOTE — ED Notes (Addendum)
Gathering supplies, time out performed procedure explained and site explained and stated. Pt verbalized all correct information. RN Sam and RN Claiborne Billings at bedside.

## 2018-05-12 NOTE — Progress Notes (Signed)
Per MD okay for RN to order 1 percocet PRN Q6. Also chest tube to be to -20 water seal.

## 2018-05-12 NOTE — ED Triage Notes (Signed)
Pt sent by Dr. Genevive Bi office for positive pneumothorax from CXR today.  Short of breath on arrival.

## 2018-05-12 NOTE — ED Notes (Signed)
Called pharmacy and waiting for them to send up LIDO with EPI

## 2018-05-12 NOTE — Progress Notes (Signed)
Patient ID: Stephen Madden, male   DOB: [birth year], 67 y.o.   MRN: [id number]  Chief Complaint  Patient presents with  . Follow-up    SOB    HPI Stephen Madden is a 67 y.o. male.  He has a past medical history significant for a spontaneous left-sided pneumothorax back in October.  He states that he was doing reasonably well until December 10 when he had a urology appointment and began experiencing what he thought was an upper respiratory tract infection.  Over the next several days he self medicated and began Mucinex several days ago.  He states that over the last week he is only been able to do a half a mile on the treadmill as opposed to 1.5 miles that he had been doing in early December.  He denied any fevers or chills or cough but called the office today and asked to be seen.  We did obtain a chest x-ray prior to his visit today and it does reveal a 10 to 20% left-sided pneumothorax.  He states that he is well unless he tries to walk on the treadmill.  His oxygen saturations were in the mid 90s today without oxygen.   Past Medical History:  Diagnosis Date  . BPH with obstruction/lower urinary tract symptoms   . COPD (chronic obstructive pulmonary disease) ([place])   . Elevated PSA   . Hypertension   . Pneumothorax 02/23/2018   left  . Tobacco abuse     Past Surgical History:  Procedure Laterality Date  . COLON SURGERY    . COLONOSCOPY WITH PROPOFOL N/A 05/30/2015   Procedure: COLONOSCOPY WITH PROPOFOL;  Surgeon: Lucilla Lame, MD;  Location: [place];  Service: Endoscopy;  Laterality: N/A;  . COLONOSCOPY WITH PROPOFOL N/A 08/28/2016   Procedure: COLONOSCOPY WITH PROPOFOL;  Surgeon: Lucilla Lame, MD;  Location: [facility];  Service: Endoscopy;  Laterality: N/A;  . CYSTOSCOPY W/ RETROGRADES Bilateral 07/11/2015   Procedure: CYSTOSCOPY WITH RETROGRADE PYELOGRAM;  Surgeon: Hollice Espy, MD;  Location: [facility];  Service: Urology;  Laterality: Bilateral;  . CYSTOSCOPY WITH STENT  PLACEMENT Bilateral 07/11/2015   Procedure: CYSTOSCOPY WITH STENT PLACEMENT;  Surgeon: Hollice Espy, MD;  Location: [facility];  Service: Urology;  Laterality: Bilateral;  . LAPAROSCOPIC PARTIAL COLECTOMY N/A 07/11/2015   Procedure: Laparoscopic right hemicolectomy;  Surgeon: Hubbard Robinson, MD;  Location: [facility];  Service: General;  Laterality: N/A;  . POLYPECTOMY  05/30/2015   Procedure: POLYPECTOMY;  Surgeon: Lucilla Lame, MD;  Location: [place];  Service: Endoscopy;;    Family History  Problem Relation Age of Onset  . Heart disease Mother   . Stroke Father   . Cancer Neg Hx   . Kidney disease Neg Hx   . Prostate cancer Neg Hx   . Bladder Cancer Neg Hx     Social History Social History   Tobacco Use  . Smoking status: Former Smoker    Packs/day: 1.00    Years: 30.00    Pack years: 30.00    Types: Cigarettes    Last attempt to quit: 11/19/2016    Years since quitting: 1.4  . Smokeless tobacco: Never Used  Substance Use Topics  . Alcohol use: Not Currently    Alcohol/week: 0.0 standard drinks    Comment: some during week  . Drug use: No    No Known Allergies  Current Outpatient Medications  Medication Sig Dispense Refill  . albuterol (PROVENTIL HFA;VENTOLIN HFA) 108 (90 Base) MCG/ACT inhaler  Inhale 2 puffs into the lungs every 4 (four) hours as needed for wheezing or shortness of breath.    Marland Kitchen atenolol (TENORMIN) 50 MG tablet Take 50 mg by mouth every morning.     . clotrimazole-betamethasone (LOTRISONE) cream Apply 1 application topically 2 (two) times daily as needed (for irritation).    Marland Kitchen dextromethorphan-guaiFENesin (MUCINEX DM) 30-600 MG 12hr tablet Take 1 tablet by mouth 2 (two) times daily.    . fluticasone furoate-vilanterol (BREO ELLIPTA) 200-25 MCG/INH AEPB Inhale 1 puff into the lungs daily as needed (for shortness of breath or wheezing).    Marland Kitchen latanoprost (XALATAN) 0.005 % ophthalmic solution Place 1 drop into both eyes at bedtime.    Marland Kitchen NIFEdipine  (PROCARDIA-XL/ADALAT CC) 30 MG 24 hr tablet Take 30 mg by mouth every morning.      No current facility-administered medications for this visit.     Location, Quality, Duration, Severity, Timing, Context, Modifying Factors, Associated Signs and Symptoms.  Review of Systems A complete review of systems was asked and was negative except for the following positive findings shortness of breath with exertion.  Blood pressure (!) 136/91, pulse 86, temperature (!) 97.3 F (36.3 C), temperature source Temporal, height 6' 4.5" (1.943 m), weight 201 lb 6.4 oz (91.4 kg), SpO2 93 %.  Physical Exam CONSTITUTIONAL:  Pleasant, well-developed, well-nourished, and in no acute distress. EYES: Pupils equal and reactive to light, Sclera non-icteric EARS, NOSE, MOUTH AND THROAT:  The oropharynx was clear.  Dentition is good repair.  Oral mucosa pink and moist. LYMPH NODES:  Lymph nodes in the neck and axillae were normal RESPIRATORY:  Lungs were clear on the right but slightly diminished on the left.  Normal respiratory effort without pathologic use of accessory muscles of respiration CARDIOVASCULAR: Heart was regular without murmurs.  There were no carotid bruits. GI: The abdomen was soft, nontender, and nondistended. There were no palpable masses. There was no hepatosplenomegaly. There were normal bowel sounds in all quadrants. GU:   MUSCULOSKELETAL:  Normal muscle strength and tone.  No clubbing or cyanosis.   SKIN:  There were no pathologic skin lesions.  There were no nodules on palpation. NEUROLOGIC:  Sensation is normal.  Cranial nerves are grossly intact. PSYCH:  Oriented to person, place and time.  Mood and affect are normal.  Data Reviewed Chest x-ray  I have personally reviewed the patient's imaging and medical records.    Assessment    Recurrent spontaneous pneumothorax    Plan    I told the patient that I believe that he needed to be admitted to the hospital.  He will be transferred to  our emergency department.  Once his admission is complete he will need a complete work-up including chest CT and possible surgical intervention for his recurrent pneumothorax.       Nestor Lewandowsky, MD 05/12/2018, 10:36 AM

## 2018-05-13 ENCOUNTER — Inpatient Hospital Stay: Payer: PPO

## 2018-05-13 MED ORDER — OXYCODONE-ACETAMINOPHEN 5-325 MG PO TABS: 1.0000 | ORAL_TABLET | Freq: Four times a day (QID) | ORAL | Status: DC | PRN

## 2018-05-13 NOTE — Progress Notes (Signed)
Dr Windell Moment gave the order for the chest tube to be disconnected so the patient can ambulate to the bathroom

## 2018-05-13 NOTE — Progress Notes (Signed)
  Patient ID: Stephen Madden, male   DOB: 1951/04/22, 67 y.o.   MRN: [id number]  HISTORY: Overall he states he feels much better.  He does have some discomfort at the chest tube site but he is not short of breath.   Vitals:   05/12/18 2047 05/13/18 0530  BP: 125/81 (!) 143/91  Pulse: (!) 56 (!) 51  Resp: 20 18  Temp: (!) 97.3 F (36.3 C) (!) 97.5 F (36.4 C)  SpO2: 98% 100%     EXAM:    Resp: Lungs are clear bilaterally but distant..  No respiratory distress, normal effort. Heart:  Regular without murmurs Abd:  Abdomen is soft, non distended and non tender. No masses are palpable.  There is no rebound and no guarding.  Neurological: Alert and oriented to person, place, and time. Coordination normal.  Skin: Skin is warm and dry. No rash noted. No diaphoretic. No erythema. No pallor.  Psychiatric: Normal mood and affect. Normal behavior. Judgment and thought content normal.   There is a small air leak with vigorous cough  ASSESSMENT: Recurrent spontaneous pneumothorax secondary to COPD.  I have independently reviewed his chest x-ray from today.  There is only a trace apical pneumothorax.   PLAN:   I had a long discussion again with him today regarding the options.  I reviewed with him the option of surgical intervention to include thoracoscopic blebectomy possible thoracotomy with blebectomy and talc pleurodesis.  I also reviewed with him the nonoperative approaches.  He and his wife are still thinking about their options.  At the present time we will keep his chest tube to suction.  We will go ahead and allow him to eat today and we will discontinue his IV fluids.    Nestor Lewandowsky, MDPatient ID: Stephen Madden, male   DOB: [birth year], 67 y.o.   MRN: [id number]

## 2018-05-13 NOTE — Plan of Care (Signed)
  Problem: Education: Goal: Knowledge of General Education information will improve Description Including pain rating scale, medication(s)/side effects and non-pharmacologic comfort measures Outcome: Progressing   Problem: Health Behavior/Discharge Planning: Goal: Ability to manage health-related needs will improve Outcome: Progressing   Problem: Clinical Measurements: Goal: Ability to maintain clinical measurements within normal limits will improve Outcome: Progressing Goal: Will remain free from infection Outcome: Progressing Goal: Diagnostic test results will improve Outcome: Progressing Goal: Respiratory complications will improve Outcome: Progressing Goal: Cardiovascular complication will be avoided Outcome: Progressing   Problem: Activity: Goal: Risk for activity intolerance will decrease Outcome: Progressing   Problem: Pain Managment: Goal: General experience of comfort will improve Outcome: Progressing Patient with c/o significant pain, PRN medication increased

## 2018-05-14 ENCOUNTER — Inpatient Hospital Stay: Payer: PPO

## 2018-05-14 MED ORDER — CEFAZOLIN SODIUM-DEXTROSE 2-4 GM/100ML-% IV SOLN
2.0000 g | INTRAVENOUS | Status: AC
  Administered 2018-05-15: 2 g via INTRAVENOUS
  Filled 2018-05-14: qty 100

## 2018-05-14 NOTE — Progress Notes (Signed)
  Patient ID: Stephen Madden, male   DOB: January 29, 1951, 67 y.o.   MRN: [id number]  HISTORY: Didn't sllep well.  Not short of breath.  No fever or cough   Vitals:   05/13/18 1957 05/14/18 0509  BP: 133/84 (!) 144/91  Pulse: 63 (!) 57  Resp: 18 16  Temp: 97.9 F (36.6 C) 97.7 F (36.5 C)  SpO2: 99% 100%     EXAM:    Resp: Lungs are clear bilaterally.  No respiratory distress, normal effort. Heart:  Regular without murmurs Abd:  Abdomen is soft, non distended and non tender. No masses are palpable.  There is no rebound and no guarding.  Neurological: Alert and oriented to person, place, and time. Coordination normal.  Skin: Skin is warm and dry. No rash noted. No diaphoretic. No erythema. No pallor.  Psychiatric: Normal mood and affect. Normal behavior. Judgment and thought content normal.   No air leak today   ASSESSMENT: Recurrent pneumothorax   PLAN:   Discussed indications and risks of proposed surgery and he is in agreement.  Will plan for surgery tomorrow if schedule permits.    Nestor Lewandowsky, MD

## 2018-05-14 NOTE — H&P (View-Only) (Signed)
  Patient ID: Stephen Madden, male   DOB: [birth year], 67 y.o.   MRN: [id number]  HISTORY: Didn't sllep well.  Not short of breath.  No fever or cough   Vitals:   05/13/18 1957 05/14/18 0509  BP: 133/84 (!) 144/91  Pulse: 63 (!) 57  Resp: 18 16  Temp: 97.9 F (36.6 C) 97.7 F (36.5 C)  SpO2: 99% 100%     EXAM:    Resp: Lungs are clear bilaterally.  No respiratory distress, normal effort. Heart:  Regular without murmurs Abd:  Abdomen is soft, non distended and non tender. No masses are palpable.  There is no rebound and no guarding.  Neurological: Alert and oriented to person, place, and time. Coordination normal.  Skin: Skin is warm and dry. No rash noted. No diaphoretic. No erythema. No pallor.  Psychiatric: Normal mood and affect. Normal behavior. Judgment and thought content normal.   No air leak today   ASSESSMENT: Recurrent pneumothorax   PLAN:   Discussed indications and risks of proposed surgery and he is in agreement.  Will plan for surgery tomorrow if schedule permits.    Nestor Lewandowsky, MD

## 2018-05-15 ENCOUNTER — Inpatient Hospital Stay: Payer: PPO | Admitting: Anesthesiology

## 2018-05-15 ENCOUNTER — Encounter
Admission: EM | Disposition: A | Discharge status: Home / Self Care | Payer: Self-pay | Source: Home / Self Care | Attending: Cardiothoracic Surgery

## 2018-05-15 ENCOUNTER — Inpatient Hospital Stay: Payer: PPO

## 2018-05-15 ENCOUNTER — Encounter: Payer: Self-pay | Admitting: Certified Registered Nurse Anesthetist

## 2018-05-15 DIAGNOSIS — J9811 Atelectasis: Secondary | ICD-10-CM

## 2018-05-15 DIAGNOSIS — J9601 Acute respiratory failure with hypoxia: Secondary | ICD-10-CM

## 2018-05-15 DIAGNOSIS — J9383 Other pneumothorax: Principal | ICD-10-CM

## 2018-05-15 HISTORY — PX: VIDEO ASSISTED THORACOSCOPY (VATS) W/TALC PLEUADESIS: SHX6168

## 2018-05-15 LAB — BASIC METABOLIC PANEL WITH GFR
Anion gap: 7 (ref 5–15)
BUN: 18 mg/dL (ref 8–23)
CO2: 27 mmol/L (ref 22–32)
Calcium: 9 mg/dL (ref 8.9–10.3)
Chloride: 104 mmol/L (ref 98–111)
Creatinine, Ser: 1.09 mg/dL (ref 0.61–1.24)
GFR calc Af Amer: >60 mL/min
GFR calc non Af Amer: >60 mL/min
Glucose, Bld: 185 mg/dL — ABNORMAL HIGH (ref 70–99)
Potassium: 3.8 mmol/L (ref 3.5–5.1)
Sodium: 138 mmol/L (ref 135–145)

## 2018-05-15 LAB — CBC
HCT: 44.1 % (ref 39.0–52.0)
Hemoglobin: 14.4 g/dL (ref 13.0–17.0)
MCH: 30.4 pg (ref 26.0–34.0)
MCHC: 32.7 g/dL (ref 30.0–36.0)
MCV: 93 fL (ref 80.0–100.0)
PLATELETS: 198 10*3/uL (ref 150–400)
RBC: 4.74 MIL/uL (ref 4.22–5.81)
RDW: 12.7 % (ref 11.5–15.5)
WBC: 13.6 10*3/uL — ABNORMAL HIGH (ref 4.0–10.5)
nRBC: 0 % (ref 0.0–0.2)

## 2018-05-15 LAB — GLUCOSE, CAPILLARY: Glucose-Capillary: 164 mg/dL — ABNORMAL HIGH (ref 70–99)

## 2018-05-15 LAB — MRSA PCR SCREENING: MRSA by PCR: NEGATIVE

## 2018-05-15 SURGERY — VIDEO ASSISTED THORACOSCOPY (VATS) W/TALC PLEUADESIS
Anesthesia: General | Laterality: Left

## 2018-05-15 MED ORDER — SODIUM CHLORIDE 0.9 % IV SOLN
INTRAVENOUS | Status: DC | PRN
  Administered 2018-05-15: 40 ug/min via INTRAVENOUS

## 2018-05-15 MED ORDER — FENTANYL CITRATE (PF) 100 MCG/2ML IJ SOLN
25.0000 ug | INTRAMUSCULAR | Status: DC | PRN
  Administered 2018-05-15 (×2): 25 ug via INTRAVENOUS

## 2018-05-15 MED ORDER — LIDOCAINE HCL (CARDIAC) PF 100 MG/5ML IV SOSY
PREFILLED_SYRINGE | INTRAVENOUS | Status: DC | PRN
  Administered 2018-05-15: 100 mg via INTRAVENOUS

## 2018-05-15 MED ORDER — BUPIVACAINE LIPOSOME 1.3 % IJ SUSP
INTRAMUSCULAR | Status: AC
  Filled 2018-05-15: qty 20

## 2018-05-15 MED ORDER — ROCURONIUM BROMIDE 50 MG/5ML IV SOLN
INTRAVENOUS | Status: AC
  Filled 2018-05-15: qty 1

## 2018-05-15 MED ORDER — ACETAMINOPHEN 10 MG/ML IV SOLN
INTRAVENOUS | Status: DC | PRN
  Administered 2018-05-15: 1000 mg via INTRAVENOUS

## 2018-05-15 MED ORDER — CEFAZOLIN SODIUM-DEXTROSE 2-4 GM/100ML-% IV SOLN
INTRAVENOUS | Status: AC
  Filled 2018-05-15: qty 100

## 2018-05-15 MED ORDER — SUCCINYLCHOLINE CHLORIDE 20 MG/ML IJ SOLN
INTRAMUSCULAR | Status: AC
  Filled 2018-05-15: qty 1

## 2018-05-15 MED ORDER — TRAMADOL HCL 50 MG PO TABS
50.0000 mg | ORAL_TABLET | Freq: Four times a day (QID) | ORAL | Status: DC
  Administered 2018-05-15 – 2018-05-16 (×3): 50 mg via ORAL
  Administered 2018-05-16 (×3): 100 mg via ORAL
  Administered 2018-05-17: 50 mg via ORAL
  Filled 2018-05-15 (×2): qty 1
  Filled 2018-05-15 (×2): qty 2
  Filled 2018-05-15 (×2): qty 1
  Filled 2018-05-15: qty 2

## 2018-05-15 MED ORDER — PROPOFOL 10 MG/ML IV BOLUS
INTRAVENOUS | Status: AC
  Filled 2018-05-15: qty 20

## 2018-05-15 MED ORDER — FENTANYL CITRATE (PF) 100 MCG/2ML IJ SOLN
INTRAMUSCULAR | Status: AC
  Administered 2018-05-15: 25 ug via INTRAVENOUS
  Filled 2018-05-15: qty 2

## 2018-05-15 MED ORDER — SUCCINYLCHOLINE CHLORIDE 20 MG/ML IJ SOLN
INTRAMUSCULAR | Status: DC | PRN
  Administered 2018-05-15: 100 mg via INTRAVENOUS

## 2018-05-15 MED ORDER — MIDAZOLAM HCL 2 MG/2ML IJ SOLN
INTRAMUSCULAR | Status: DC | PRN
  Administered 2018-05-15: 2 mg via INTRAVENOUS

## 2018-05-15 MED ORDER — OXYCODONE-ACETAMINOPHEN 5-325 MG PO TABS
1.0000 | ORAL_TABLET | ORAL | Status: DC | PRN
  Administered 2018-05-15 – 2018-05-16 (×2): 2 via ORAL
  Filled 2018-05-15 (×2): qty 2

## 2018-05-15 MED ORDER — MIDAZOLAM HCL 2 MG/2ML IJ SOLN
INTRAMUSCULAR | Status: AC
  Filled 2018-05-15: qty 2

## 2018-05-15 MED ORDER — IPRATROPIUM-ALBUTEROL 0.5-2.5 (3) MG/3ML IN SOLN
RESPIRATORY_TRACT | Status: AC
  Administered 2018-05-15: 3 mL via RESPIRATORY_TRACT
  Filled 2018-05-15: qty 3

## 2018-05-15 MED ORDER — ONDANSETRON HCL 4 MG/2ML IJ SOLN: 4.0000 mg | Freq: Four times a day (QID) | INTRAMUSCULAR | Status: DC | PRN

## 2018-05-15 MED ORDER — ALBUTEROL SULFATE (2.5 MG/3ML) 0.083% IN NEBU: 2.5000 mg | INHALATION_SOLUTION | RESPIRATORY_TRACT | Status: DC | PRN

## 2018-05-15 MED ORDER — SUGAMMADEX SODIUM 200 MG/2ML IV SOLN
INTRAVENOUS | Status: DC | PRN
  Administered 2018-05-15: 200 mg via INTRAVENOUS

## 2018-05-15 MED ORDER — SEVOFLURANE IN SOLN
RESPIRATORY_TRACT | Status: AC
  Filled 2018-05-15: qty 250

## 2018-05-15 MED ORDER — BUPIVACAINE HCL (PF) 0.25 % IJ SOLN
INTRAMUSCULAR | Status: AC
  Filled 2018-05-15: qty 30

## 2018-05-15 MED ORDER — TALC (STERITALC) POWDER FOR INTRAPLEURAL USE
INTRAPLEURAL | Status: AC
  Filled 2018-05-15: qty 4

## 2018-05-15 MED ORDER — IPRATROPIUM-ALBUTEROL 0.5-2.5 (3) MG/3ML IN SOLN
3.0000 mL | Freq: Once | RESPIRATORY_TRACT | Status: AC
  Administered 2018-05-15: 3 mL via RESPIRATORY_TRACT

## 2018-05-15 MED ORDER — ALBUTEROL SULFATE (2.5 MG/3ML) 0.083% IN NEBU: 2.5000 mg | INHALATION_SOLUTION | RESPIRATORY_TRACT | Status: DC

## 2018-05-15 MED ORDER — LACTATED RINGERS IV SOLN
INTRAVENOUS | Status: DC
  Administered 2018-05-15: 13:00:00 via INTRAVENOUS

## 2018-05-15 MED ORDER — SUGAMMADEX SODIUM 200 MG/2ML IV SOLN
INTRAVENOUS | Status: AC
  Filled 2018-05-15: qty 2

## 2018-05-15 MED ORDER — ACETAMINOPHEN 10 MG/ML IV SOLN
INTRAVENOUS | Status: AC
  Filled 2018-05-15: qty 100

## 2018-05-15 MED ORDER — NIFEDIPINE ER OSMOTIC RELEASE 30 MG PO TB24: 30.0000 mg | ORAL_TABLET | ORAL | Status: DC

## 2018-05-15 MED ORDER — KETOROLAC TROMETHAMINE 30 MG/ML IJ SOLN
INTRAMUSCULAR | Status: AC
  Administered 2018-05-15: 30 mg
  Filled 2018-05-15: qty 1

## 2018-05-15 MED ORDER — DEXAMETHASONE SODIUM PHOSPHATE 10 MG/ML IJ SOLN
INTRAMUSCULAR | Status: DC | PRN
  Administered 2018-05-15: 10 mg via INTRAVENOUS

## 2018-05-15 MED ORDER — BISACODYL 5 MG PO TBEC
10.0000 mg | DELAYED_RELEASE_TABLET | Freq: Every day | ORAL | Status: DC
  Administered 2018-05-17: 10 mg via ORAL
  Filled 2018-05-15 (×6): qty 2

## 2018-05-15 MED ORDER — MORPHINE SULFATE (PF) 2 MG/ML IV SOLN: 1.0000 mg | INTRAVENOUS | Status: DC | PRN

## 2018-05-15 MED ORDER — LIDOCAINE HCL (PF) 2 % IJ SOLN
INTRAMUSCULAR | Status: AC
  Filled 2018-05-15: qty 10

## 2018-05-15 MED ORDER — EPHEDRINE SULFATE 50 MG/ML IJ SOLN
INTRAMUSCULAR | Status: DC | PRN
  Administered 2018-05-15 (×2): 15 mg via INTRAVENOUS

## 2018-05-15 MED ORDER — CEFAZOLIN SODIUM-DEXTROSE 2-4 GM/100ML-% IV SOLN
2.0000 g | Freq: Three times a day (TID) | INTRAVENOUS | Status: AC
  Administered 2018-05-16 (×2): 2 g via INTRAVENOUS
  Filled 2018-05-15 (×4): qty 100

## 2018-05-15 MED ORDER — SODIUM CHLORIDE (PF) 0.9 % IJ SOLN
INTRAMUSCULAR | Status: AC
  Filled 2018-05-15: qty 50

## 2018-05-15 MED ORDER — DEXTROSE-NACL 5-0.45 % IV SOLN
INTRAVENOUS | Status: DC
  Administered 2018-05-15 – 2018-05-16 (×2): via INTRAVENOUS

## 2018-05-15 MED ORDER — ALBUTEROL SULFATE HFA 108 (90 BASE) MCG/ACT IN AERS
INHALATION_SPRAY | RESPIRATORY_TRACT | Status: DC | PRN
  Administered 2018-05-15: 4 via RESPIRATORY_TRACT

## 2018-05-15 MED ORDER — TALC 5 G PL SUSR
INTRAPLEURAL | Status: DC | PRN
  Administered 2018-05-15: 4 g via INTRAPLEURAL

## 2018-05-15 MED ORDER — FENTANYL CITRATE (PF) 100 MCG/2ML IJ SOLN
INTRAMUSCULAR | Status: DC | PRN
  Administered 2018-05-15: 50 ug via INTRAVENOUS
  Administered 2018-05-15: 100 ug via INTRAVENOUS
  Administered 2018-05-15: 50 ug via INTRAVENOUS

## 2018-05-15 MED ORDER — IPRATROPIUM-ALBUTEROL 0.5-2.5 (3) MG/3ML IN SOLN
3.0000 mL | RESPIRATORY_TRACT | Status: DC
  Administered 2018-05-16 – 2018-05-17 (×8): 3 mL via RESPIRATORY_TRACT
  Filled 2018-05-15 (×10): qty 3

## 2018-05-15 MED ORDER — PROPOFOL 10 MG/ML IV BOLUS
INTRAVENOUS | Status: DC | PRN
  Administered 2018-05-15: 200 mg via INTRAVENOUS

## 2018-05-15 MED ORDER — ONDANSETRON HCL 4 MG/2ML IJ SOLN
INTRAMUSCULAR | Status: DC | PRN
  Administered 2018-05-15: 4 mg via INTRAVENOUS

## 2018-05-15 MED ORDER — FENTANYL CITRATE (PF) 250 MCG/5ML IJ SOLN
INTRAMUSCULAR | Status: AC
  Filled 2018-05-15: qty 5

## 2018-05-15 MED ORDER — KETOROLAC TROMETHAMINE 30 MG/ML IJ SOLN: 30.0000 mg | Freq: Once | INTRAMUSCULAR | Status: DC

## 2018-05-15 MED ORDER — PHENYLEPHRINE HCL 10 MG/ML IJ SOLN
INTRAMUSCULAR | Status: DC | PRN
  Administered 2018-05-15 (×4): 200 ug via INTRAVENOUS

## 2018-05-15 MED ORDER — FLUTICASONE FUROATE-VILANTEROL 200-25 MCG/INH IN AEPB: 1.0000 | INHALATION_SPRAY | Freq: Every day | RESPIRATORY_TRACT | Status: DC | PRN

## 2018-05-15 MED ORDER — ATENOLOL 25 MG PO TABS: 50.0000 mg | ORAL_TABLET | ORAL | Status: DC

## 2018-05-15 MED ORDER — ONDANSETRON HCL 4 MG/2ML IJ SOLN: 4.0000 mg | Freq: Once | INTRAMUSCULAR | Status: DC | PRN

## 2018-05-15 MED ORDER — ROCURONIUM BROMIDE 100 MG/10ML IV SOLN
INTRAVENOUS | Status: DC | PRN
  Administered 2018-05-15: 10 mg via INTRAVENOUS
  Administered 2018-05-15: 40 mg via INTRAVENOUS
  Administered 2018-05-15: 20 mg via INTRAVENOUS

## 2018-05-15 MED ORDER — EPHEDRINE SULFATE 50 MG/ML IJ SOLN
INTRAMUSCULAR | Status: AC
  Filled 2018-05-15: qty 1

## 2018-05-15 MED ORDER — LIDOCAINE HCL 4 % MT SOLN
OROMUCOSAL | Status: DC | PRN
  Administered 2018-05-15: 4 mL via TOPICAL

## 2018-05-15 MED ORDER — LATANOPROST 0.005 % OP SOLN: 1.0000 [drp] | Freq: Every day | OPHTHALMIC | Status: DC

## 2018-05-15 SURGICAL SUPPLY — 54 items
BNDG COHESIVE 4X5 TAN STRL (GAUZE/BANDAGES/DRESSINGS) ×2 IMPLANT
BRONCHOSCOPE PED SLIM DISP (MISCELLANEOUS) ×2 IMPLANT
CATH THOR STR 32F 8032 SOFT WA (CATHETERS) ×2 IMPLANT
CATH URET ROBINSON 16FR STRL (CATHETERS) IMPLANT
CHLORAPREP W/TINT 26ML (MISCELLANEOUS) ×4 IMPLANT
CUTTER ECHEON FLEX ENDO 45 340 (ENDOMECHANICALS) ×2 IMPLANT
DEFOGGER SCOPE WARMER CLEARIFY (MISCELLANEOUS) ×2 IMPLANT
DRAIN CHEST DRY SUCT SGL (MISCELLANEOUS) ×2 IMPLANT
DRAPE C-SECTION (MISCELLANEOUS) ×2 IMPLANT
DRAPE MAG INST 16X20 L/F (DRAPES) ×2 IMPLANT
DRAPE POUCH INSTRU U-SHP 10X18 (DRAPES) ×2 IMPLANT
DRSG OPSITE POSTOP 3X4 (GAUZE/BANDAGES/DRESSINGS) ×6 IMPLANT
ELECT BLADE 6 FLAT ULTRCLN (ELECTRODE) IMPLANT
ELECT BLADE 6.5 EXT (BLADE) ×2 IMPLANT
ELECT CAUTERY BLADE TIP 2.5 (TIP) ×2
ELECT REM PT RETURN 9FT ADLT (ELECTROSURGICAL) ×2
ELECTRODE CAUTERY BLDE TIP 2.5 (TIP) ×1 IMPLANT
ELECTRODE REM PT RTRN 9FT ADLT (ELECTROSURGICAL) ×1 IMPLANT
GAUZE SPONGE 4X4 12PLY STRL (GAUZE/BANDAGES/DRESSINGS) ×2 IMPLANT
GLOVE SURG SYN 7.5  E (GLOVE) ×2
GLOVE SURG SYN 7.5 E (GLOVE) ×2 IMPLANT
GOWN STRL REUS W/ TWL LRG LVL3 (GOWN DISPOSABLE) ×4 IMPLANT
GOWN STRL REUS W/TWL LRG LVL3 (GOWN DISPOSABLE) ×4
KIT TURNOVER KIT A (KITS) ×2 IMPLANT
LABEL OR SOLS (LABEL) ×2 IMPLANT
LOOP RED MAXI  1X406MM (MISCELLANEOUS) ×1
LOOP VESSEL MAXI 1X406 RED (MISCELLANEOUS) ×1 IMPLANT
MARKER SKIN DUAL TIP RULER LAB (MISCELLANEOUS) ×2 IMPLANT
NEEDLE SPNL 20GX3.5 QUINCKE YW (NEEDLE) ×2 IMPLANT
NS IRRIG 500ML POUR BTL (IV SOLUTION) ×2 IMPLANT
PACK BASIN MAJOR ARMC (MISCELLANEOUS) ×2 IMPLANT
SCISSORS METZENBAUM CVD 33 (INSTRUMENTS) ×2 IMPLANT
SPONGE KITTNER 5P (MISCELLANEOUS) ×4 IMPLANT
STAPLE RELOAD 45MM GOLD (STAPLE) ×12 IMPLANT
STAPLER SKIN PROX 35W (STAPLE) ×2 IMPLANT
STAPLER VASCULAR ECHELON 35 (CUTTER) IMPLANT
SUT ETHILON 4-0 (SUTURE)
SUT ETHILON 4-0 FS2 18XMFL BLK (SUTURE)
SUT MNCRL AB 3-0 PS2 27 (SUTURE) ×4 IMPLANT
SUT SILK 1 SH (SUTURE) ×6 IMPLANT
SUT VIC AB 0 CT1 36 (SUTURE) ×4 IMPLANT
SUT VIC AB 2-0 CT1 27 (SUTURE) ×2
SUT VIC AB 2-0 CT1 TAPERPNT 27 (SUTURE) ×2 IMPLANT
SUT VIC AB 2-0 CT2 27 (SUTURE) ×4 IMPLANT
SUT VICRYL 2 TP 1 (SUTURE) ×8 IMPLANT
SUTURE ETHLN 4-0 FS2 18XMF BLK (SUTURE) IMPLANT
SYR BULB IRRIG 60ML STRL (SYRINGE) ×2 IMPLANT
TAPE CLOTH 3X10 WHT NS LF (GAUZE/BANDAGES/DRESSINGS) ×2 IMPLANT
TAPE TRANSPORE STRL 2 31045 (GAUZE/BANDAGES/DRESSINGS) ×2 IMPLANT
TRAY FOLEY MTR SLVR 16FR STAT (SET/KITS/TRAYS/PACK) ×2 IMPLANT
TROCAR FLEXIPATH 20X80 (ENDOMECHANICALS) ×2 IMPLANT
TROCAR FLEXIPATH THORACIC 15MM (ENDOMECHANICALS) IMPLANT
WATER STERILE IRR 1000ML POUR (IV SOLUTION) ×2 IMPLANT
YANKAUER SUCT BULB TIP FLEX NO (MISCELLANEOUS) ×2 IMPLANT

## 2018-05-15 NOTE — Anesthesia Post-op Follow-up Note (Signed)
Anesthesia QCDR form completed.        

## 2018-05-15 NOTE — Care Management (Signed)
Barrier- Thoracoscopy w/ talc pleurodesis.

## 2018-05-15 NOTE — Consult Note (Signed)
Reason for Consult: Assistance with management of pulmonary issues postoperatively. Referring Physician: Nestor Lewandowsky, MD  Stephen Madden is an 67 y.o. male.  HPI:  Stephen Madden is a 64 year old former smoker (quit July 2018) who presented with recurrent left spontaneous pneumothorax.  The patient has a history of chronic obstructive pulmonary disease on the basis of bullous emphysema.  Dr. Faith Madden performed VATS bullectomy and pleurodesis today.  Postoperatively the patient was having very shallow respirations and had some desaturations.  Chest x-ray revealed that left lung was expanded chest tube in place.  The right lung had some basilar atelectasis.  We were asked to assist with his pulmonary care management due to his hypoxemia postoperatively.  The patient is to go to the ICU/stepdown.  We are asked to assist with management while there.  On my evaluation the patient was still sleepy but easily awakened.  He was breathing quite shallowly though PACU nurses reported that his breathing had improved dramatically.  He had received a DuoNeb and his saturations were noted to be 100% on a simple mask with 8 L of oxygen bled in.  The patient was not in overt respiratory distress just breathing somewhat shallowly.  The patient's only complaint was that he felt "out of sorts" and he was also complaining of left incisional pain.  No other complaint was expressed.  The patient had received IV Tylenol postoperatively and fentanyl but was still experiencing significant pain.  We provided him with a single dose of Toradol which seemed to ameliorate his pain.  Patient states that he quit smoking in July 2018 and has been totally abstinent.   Past Medical History:  Diagnosis Date  . BPH with obstruction/lower urinary tract symptoms   . COPD (chronic obstructive pulmonary disease) ([place])   . Elevated PSA   . Hypertension   . Pneumothorax 02/23/2018   left  . Tobacco abuse     Past Surgical History:  Procedure  Laterality Date  . COLON SURGERY    . COLONOSCOPY WITH PROPOFOL N/A 05/30/2015   Procedure: COLONOSCOPY WITH PROPOFOL;  Surgeon: Stephen Lame, MD;  Location: [place];  Service: Endoscopy;  Laterality: N/A;  . COLONOSCOPY WITH PROPOFOL N/A 08/28/2016   Procedure: COLONOSCOPY WITH PROPOFOL;  Surgeon: Stephen Lame, MD;  Location: [facility];  Service: Endoscopy;  Laterality: N/A;  . CYSTOSCOPY W/ RETROGRADES Bilateral 07/11/2015   Procedure: CYSTOSCOPY WITH RETROGRADE PYELOGRAM;  Surgeon: Stephen Espy, MD;  Location: [facility];  Service: Urology;  Laterality: Bilateral;  . CYSTOSCOPY WITH STENT PLACEMENT Bilateral 07/11/2015   Procedure: CYSTOSCOPY WITH STENT PLACEMENT;  Surgeon: Stephen Espy, MD;  Location: [facility];  Service: Urology;  Laterality: Bilateral;  . LAPAROSCOPIC PARTIAL COLECTOMY N/A 07/11/2015   Procedure: Laparoscopic right hemicolectomy;  Surgeon: Stephen Robinson, MD;  Location: [facility];  Service: General;  Laterality: N/A;  . POLYPECTOMY  05/30/2015   Procedure: POLYPECTOMY;  Surgeon: Stephen Lame, MD;  Location: [place];  Service: Endoscopy;;    Family History  Problem Relation Age of Onset  . Heart disease Mother   . Stroke Father   . Cancer Neg Hx   . Kidney disease Neg Hx   . Prostate cancer Neg Hx   . Bladder Cancer Neg Hx     Social History:  reports that he quit smoking about 17 months ago. His smoking use included cigarettes. He has a 30.00 pack-year smoking history. He has never used smokeless tobacco. He reports previous alcohol use. He reports that  he does not use drugs.  Allergies: No Known Allergies  Medications:  Prior to Admission:  Medications Prior to Admission  Medication Sig Dispense Refill Last Dose  . albuterol (PROVENTIL HFA;VENTOLIN HFA) 108 (90 Base) MCG/ACT inhaler Inhale 2 puffs into the lungs every 4 (four) hours as needed for wheezing or shortness of breath.   05/12/2018 at am  . atenolol (TENORMIN) 50 MG tablet  Take 50 mg by mouth every morning.    05/12/2018 at 0900  . clotrimazole-betamethasone (LOTRISONE) cream Apply 1 application topically 2 (two) times daily as needed (for irritation).   unknown at unknown  . dextromethorphan-guaiFENesin (MUCINEX DM) 30-600 MG 12hr tablet Take 1 tablet by mouth 2 (two) times daily as needed for cough.    unknown at unknown  . fluticasone furoate-vilanterol (BREO ELLIPTA) 200-25 MCG/INH AEPB Inhale 1 puff into the lungs daily as needed (for shortness of breath or wheezing).   unknown at unknown  . latanoprost (XALATAN) 0.005 % ophthalmic solution Place 1 drop into both eyes at bedtime.   05/11/2018 at pm  . NIFEdipine (PROCARDIA-XL/ADALAT CC) 30 MG 24 hr tablet Take 30 mg by mouth every morning.    05/12/2018 at am  Current medications were reviewed.  Results for orders placed or performed during the hospital encounter of 05/12/18 (from the past 48 hour(s))  MRSA PCR Screening     Status: None   Collection Time: 05/15/18  7:36 PM  Result Value Ref Range   MRSA by PCR NEGATIVE NEGATIVE    Comment:        The GeneXpert MRSA Assay (FDA approved for NASAL specimens only), is one component of a comprehensive MRSA colonization surveillance program. It is not intended to diagnose MRSA infection nor to guide or monitor treatment for MRSA infections. Performed at [facility], [address] [place], [place] [address]   Glucose, capillary     Status: Abnormal   Collection Time: 05/15/18  7:43 PM  Result Value Ref Range   Glucose-Capillary 164 (H) 70 - 99 mg/dL  CBC     Status: Abnormal   Collection Time: 05/15/18  8:26 PM  Result Value Ref Range   WBC 13.6 (H) 4.0 - 10.5 K/uL   RBC 4.74 4.22 - 5.81 MIL/uL   Hemoglobin 14.4 13.0 - 17.0 g/dL   HCT 44.1 39.0 - 52.0 %   MCV 93.0 80.0 - 100.0 fL   MCH 30.4 26.0 - 34.0 pg   MCHC 32.7 30.0 - 36.0 g/dL   RDW 12.7 11.5 - 15.5 %   Platelets 198 150 - 400 K/uL   nRBC 0.0 0.0 - 0.2 %    Comment:  Performed at [facility], [address] [place], [place] [address]  Basic metabolic panel     Status: Abnormal   Collection Time: 05/15/18  8:26 PM  Result Value Ref Range   Sodium 138 135 - 145 mmol/L   Potassium 3.8 3.5 - 5.1 mmol/L   Chloride 104 98 - 111 mmol/L   CO2 27 22 - 32 mmol/L   Glucose, Bld 185 (H) 70 - 99 mg/dL   BUN 18 8 - 23 mg/dL   Creatinine, Ser 1.09 0.61 - 1.24 mg/dL   Calcium 9.0 8.9 - 10.3 mg/dL   GFR calc non Af Amer >60 >60 mL/min   GFR calc Af Amer >60 >60 mL/min   Anion gap 7 5 - 15    Comment: Performed at [facility], [address]  [place], [place] [address]    Dg Chest Port 1 View  Result Date: 05/15/2018 CLINICAL DATA:  Postop EXAM: PORTABLE CHEST 1 VIEW COMPARISON:  05/14/2018 FINDINGS: Right mid lung platelike atelectasis. Left chest tube has been placed. No visible pneumothorax. Heart is borderline in size. No visible effusions or acute bony abnormality. IMPRESSION: Left chest tube in place without visible pneumothorax. Right mid lung atelectasis. Electronically Signed   By: Rolm Baptise M.D.   On: 05/15/2018 18:15   Dg Chest Port 1 View  Result Date: 05/14/2018 CLINICAL DATA:  Left-sided chest tube came out. Pneumothorax. EXAM: PORTABLE CHEST 1 VIEW COMPARISON:  05/13/2018 FINDINGS: The heart size and mediastinal contours are within normal limits. Left-sided chest tube is no longer present. Trace left paramediastinal and apical pneumothorax without significant progression is identified. No mediastinal shift. The visualized skeletal structures are unremarkable. IMPRESSION: Left-sided chest tube is no longer present. Stable trace left-sided pneumothorax. Electronically Signed   By: Ashley Royalty M.D.   On: 05/14/2018 14:18    Review of Systems  Constitutional: Negative.   HENT: Negative.   Eyes: Negative.   Respiratory: Positive for shortness of breath.   Cardiovascular: Negative.   Gastrointestinal: Negative.    Genitourinary: Negative.   Musculoskeletal: Negative.   Skin: Negative.   Neurological: Negative.   Endo/Heme/Allergies: Negative.   Psychiatric/Behavioral: Negative.   All other systems reviewed and are negative.  Blood pressure 123/88, pulse 80, temperature (!) 97.5 F (36.4 C), temperature source Oral, resp. rate 17, height 6' 4.5" (1.943 m), weight 90.7 kg, SpO2 96 %. Physical Exam  Nursing note and vitals reviewed. Constitutional: He is oriented to person, place, and time. He appears well-developed and well-nourished.  Non-toxic appearance. No distress.  Sleepy but easily awakened.  HENT:  Head: Normocephalic and atraumatic.  Right Ear: External ear normal.  Left Ear: External ear normal.  Nose: Nose normal.  Mouth/Throat: No oropharyngeal exudate.  Eyes: Pupils are equal, round, and reactive to light. EOM are normal. No scleral icterus.  Neck: Neck supple. No JVD present. No tracheal deviation present. No thyromegaly present.  No crepitus  Cardiovascular: Normal rate, regular rhythm, normal heart sounds and intact distal pulses.  No murmur heard. Respiratory: No accessory muscle usage or stridor. No respiratory distress. He has wheezes. He has no rales. He exhibits tenderness.  Breathing shallowly.  Distant breath sounds throughout.  Left chest tube in place at -20 cm H2O.  GI: Soft. Bowel sounds are normal. He exhibits no distension.  Musculoskeletal: Normal range of motion.        General: No edema.  Lymphadenopathy:    He has no cervical adenopathy.  Neurological: He is oriented to person, place, and time. No cranial nerve deficit.  Sleepy but easily awakened.  Skin: Skin is warm and dry.  Psychiatric: He has a normal mood and affect. His behavior is normal.    After nebulization treatment with DuoNeb, the patient's saturations improved.  Respiratory effort slowly improving.   Assessment/Plan:   1.  Acute hypoxic respiratory failure/postoperative hypoxemia after  bullectomy: This could be related to change in VQ relationship of the lung postoperatively.  Patient is also making shallow respiratory effort which may indicate some residual neuromuscular blockade.  There have been reports that in patients with advanced COPD sugammadex reversal may need to be repeated due to incomplete neuromuscular blockade reversal.  In any event, the patient appears to be regaining better respiratory muscle control.  Continue supplemental oxygen to maintain oxygen saturations between  92 to 95% postoperatively.  Patient also was noted to have significant atelectasis on the right lower lobe and this may be a contributing factor.  We will add flutter valve to assist with mucociliary clearance.  Mobilize the patient as soon as feasible from the operative standpoint.  2.  Chronic obstructive pulmonary disease with significant bullous emphysema: DuoNebs every 4 hours while awake.  Pulmonary toilet with flutter valve, incentive spirometer and out of bed as tolerated.  3.  Recurrent spontaneous pneumothorax: Status post bullectomy as above.    Thank you for allowing [place] to participate in this patient's care.  We will continue to follow along with you.  All the above was discussed with Dr. Genevive Bi.  Vernard Gambles 05/15/2018, 9:31 PM

## 2018-05-15 NOTE — Care Management Important Message (Signed)
Important Message  Patient Details  Name: Stephen Madden MRN: [id number] Date of Birth: September 08, 1950   Medicare Important Message Given:  Yes    Juliann Pulse A Destin Vinsant 05/15/2018, 11:36 AM

## 2018-05-15 NOTE — Interval H&P Note (Signed)
History and Physical Interval Note:  05/15/2018 2:47 PM  Stephen Madden  has presented today for surgery, with the diagnosis of Pneumothorax  The various methods of treatment have been discussed with the patient and family. After consideration of risks, benefits and other options for treatment, the patient has consented to  Procedure(s): VIDEO ASSISTED THORACOSCOPY (VATS) W/TALC PLEUADESIS (Left) as a surgical intervention .  The patient's history has been reviewed, patient examined, no change in status, stable for surgery.  I have reviewed the patient's chart and labs.  Questions were answered to the patient's satisfaction.     Nestor Lewandowsky

## 2018-05-15 NOTE — Anesthesia Preprocedure Evaluation (Signed)
Anesthesia Evaluation  Patient identified by MRN, date of birth, ID band Patient awake    Reviewed: Allergy & Precautions, NPO status , Patient's Chart, lab work & pertinent test results  History of Anesthesia Complications Negative for: history of anesthetic complications  Airway Mallampati: II       Dental   Pulmonary neg sleep apnea, COPD,  COPD inhaler, former smoker,           Cardiovascular hypertension, Pt. on medications and Pt. on home beta blockers (-) Past MI and (-) CHF (-) dysrhythmias (-) Valvular Problems/Murmurs     Neuro/Psych neg Seizures    GI/Hepatic negative GI ROS, Neg liver ROS,   Endo/Other  neg diabetes  Renal/GU negative Renal ROS     Musculoskeletal   Abdominal   Peds  Hematology   Anesthesia Other Findings   Reproductive/Obstetrics                             Anesthesia Physical Anesthesia Plan  ASA: III  Anesthesia Plan: General   Post-op Pain Management:    Induction: Intravenous  PONV Risk Score and Plan: 2 and Dexamethasone and Ondansetron  Airway Management Planned: Oral ETT and Double Lumen EBT  Additional Equipment:   Intra-op Plan:   Post-operative Plan:   Informed Consent: I have reviewed the patients History and Physical, chart, labs and discussed the procedure including the risks, benefits and alternatives for the proposed anesthesia with the patient or authorized representative who has indicated his/her understanding and acceptance.     Plan Discussed with:   Anesthesia Plan Comments:         Anesthesia Quick Evaluation

## 2018-05-15 NOTE — Transfer of Care (Signed)
Immediate Anesthesia Transfer of Care Note  Patient: Stephen Madden  Procedure(s) Performed: VIDEO ASSISTED THORACOSCOPY (VATS) W/TALC PLEUADESIS (Left )  Patient Location: PACU  Anesthesia Type:General  Level of Consciousness: awake  Airway & Oxygen Therapy: Patient connected to face mask oxygen  Post-op Assessment: Post -op Vital signs reviewed and stable  Post vital signs: stable  Last Vitals:  Vitals Value Taken Time  BP 151/106 05/15/2018  5:49 PM  Temp    Pulse 81 05/15/2018  5:55 PM  Resp 21 05/15/2018  5:55 PM  SpO2 100 % 05/15/2018  5:55 PM  Vitals shown include unvalidated device data.  Last Pain:  Vitals:   05/15/18 1302  TempSrc: Temporal  PainSc: 0-No pain      Patients Stated Pain Goal: 0 (00/71/21 9758)  Complications: No apparent anesthesia complications

## 2018-05-15 NOTE — Op Note (Signed)
  05/15/2018  5:47 PM  PATIENT:  Stephen Madden  67 y.o. male  PRE-OPERATIVE DIAGNOSIS: Recurrent spontaneous pneumothorax with bullous emphysema  POST-OPERATIVE DIAGNOSIS: Recurrent spontaneous pneumothorax with bullous emphysema  PROCEDURE: Preoperative bronchoscopy; left thoracoscopy with apical bullectomy; talc pleurodesis  SURGEON:  Surgeon(s) and Role:    * Nestor Lewandowsky, MD - Primary    * Olean Ree, MD - Assisting  ASSISTANTS: Ardath Sax MD  ANESTHESIA: General endotracheal through a double-lumen tube  INDICATIONS FOR PROCEDURE recurrent pneumothorax in the setting of bullous emphysema.  The indications and risks of the above-named surgical procedure were explained the patient gives informed consent  DICTATION: Patient is brought to the operative suite and placed in the supine position.  General endotracheal anesthesia was given with a double-lumen tube.  Preoperative bronchoscopy was carried out.  There were some thick mucoid secretions scattered throughout the airways but there is no evidence of tumor, bleeding or purulent secretions.  Patient was turned for left thoracoscopy.  All pressure points were carefully padded.  The patient was prepped and draped in usual sterile fashion.  We began by making an anterior port in about the fifth intercostal space.  Incision was deepened down through the muscles of the chest wall until the pleural space was entered.  A 20 mm trocar was inserted.  Thoracoscopy was carried out.  We could see that there is severe bullous changes throughout the lungs both upper and lower lobes.  At the very apex of the left upper lobe there were multiple blebs at in aggregate measured several centimeters in size.  Using a combination of the endoscopic stapler these were resected.  We had made 2 additional trocar sites in the lower chest and at the tip of the scapula.  Through these 3 working ports were able to get a good look at the lung.  There were  significant bullous changes throughout the.  Therefore I elected to use talc in addition and 4 g of sterile talc were then insufflated under direct visualization cutting the entire hemithorax.  Through the most inferior port site we placed a 32 French tube at the apex of the pleural space.  This was secured with silk.  The wounds were then examined again and they were all closed with running absorbable sutures in multiple layers.  The patient was then extubated and taken to the recovery room in stable condition.   Nestor Lewandowsky, MD

## 2018-05-15 NOTE — Anesthesia Procedure Notes (Signed)
Procedure Name: Intubation Date/Time: 05/15/2018 3:30 PM Performed by: Eben Burow, CRNA Pre-anesthesia Checklist: Patient identified, Emergency Drugs available, Suction available and Patient being monitored Patient Re-evaluated:Patient Re-evaluated prior to induction Oxygen Delivery Method: Circle system utilized Preoxygenation: Pre-oxygenation with 100% oxygen Induction Type: IV induction Ventilation: Mask ventilation without difficulty Laryngoscope Size: Miller and 3 Grade View: Grade I Endobronchial tube: Left, Double lumen EBT, EBT position confirmed by fiberoptic bronchoscope and EBT position confirmed by auscultation and 41 Fr Number of attempts: 1 Airway Equipment and Method: Stylet and LTA kit utilized Placement Confirmation: ETT inserted through vocal cords under direct vision,  positive ETCO2 and breath sounds checked- equal and bilateral Secured at: 31 cm Tube secured with: Tape Dental Injury: Teeth and Oropharynx as per pre-operative assessment

## 2018-05-16 ENCOUNTER — Encounter: Payer: Self-pay | Admitting: Cardiothoracic Surgery

## 2018-05-16 DIAGNOSIS — J432 Centrilobular emphysema: Secondary | ICD-10-CM

## 2018-05-16 MED ORDER — BISMUTH SUBSALICYLATE 262 MG/15ML PO SUSP
30.0000 mL | ORAL | Status: DC | PRN
  Administered 2018-05-16: 30 mL via ORAL
  Filled 2018-05-16: qty 118

## 2018-05-16 MED ORDER — BISMUTH SUBSALICYLATE 262 MG PO CHEW: 524.0000 mg | CHEWABLE_TABLET | ORAL | Status: DC | PRN

## 2018-05-16 NOTE — Progress Notes (Signed)
Follow up - Critical Care Medicine Note  Patient Details:    Stephen Madden is an 67 y.o. male.  Feels better today.  Wants to be out of the unit.  Wants to start getting out of bed.  No dyspnea.   Lines, Airways, Drains: Chest Tube 1 Left;Lateral Pleural 32 Fr. (Active)  Suction -20 cm H2O 05/16/2018  8:45 AM  Chest Tube Air Leak None 05/16/2018  8:45 AM  Patency Intervention Milked 05/16/2018 12:00 AM  Drainage Description Serosanguineous 05/16/2018  8:45 AM  Dressing Status Clean;Dry;Intact 05/16/2018  8:45 AM  Site Assessment Other (Comment) 05/16/2018  8:45 AM  Surrounding Skin Unable to view 05/15/2018  7:05 PM  Output (mL) 0 mL 05/16/2018  4:08 AM    Anti-infectives:  Anti-infectives (From admission, onward)   Start     Dose/Rate Route Frequency Ordered Stop   05/15/18 2330  ceFAZolin (ANCEF) IVPB 2g/100 mL premix     2 g 200 mL/hr over 30 Minutes Intravenous Every 8 hours 05/15/18 1958 05/16/18 0913   05/15/18 1245  ceFAZolin (ANCEF) 2-4 GM/100ML-% IVPB    Note to Pharmacy:  Stephen Madden   : cabinet override      05/15/18 1245 05/15/18 1539   05/14/18 2142  ceFAZolin (ANCEF) IVPB 2g/100 mL premix     2 g 200 mL/hr over 30 Minutes Intravenous 30 min pre-op 05/14/18 2142 05/15/18 1544      Microbiology: Results for orders placed or performed during the hospital encounter of 05/12/18  MRSA PCR Screening     Status: None   Collection Time: 05/15/18  7:36 PM  Result Value Ref Range Status   MRSA by PCR NEGATIVE NEGATIVE Final    Comment:        The GeneXpert MRSA Assay (FDA approved for NASAL specimens only), is one component of a comprehensive MRSA colonization surveillance program. It is not intended to diagnose MRSA infection nor to guide or monitor treatment for MRSA infections. Performed at [facility], [address] [place], Stephen Madden     Best Practice/Protocols:  SCDs for DVT prophylaxis, early ambulation.   Events: No  untoward events.  Studies: Dg Chest 2 View  Result Date: 05/12/2018 CLINICAL DATA:  Cough, shortness of breath EXAM: CHEST - 2 VIEW COMPARISON:  03/17/2018 FINDINGS: The lungs are hyperinflated likely secondary to COPD. There is a small left pleural effusion. There is a moderate-sized left pneumothorax measuring approximately 25%. There is no right pneumothorax. There is no focal consolidation. Stable cardiomediastinal silhouette. The osseous structures are unremarkable. IMPRESSION: Moderate-sized acute left pneumothorax measuring 25%. Small left pleural effusion. Critical Value/emergent results were called by telephone at the time of interpretation on 05/12/2018 at 10:38 am to Dr. Nestor Madden , who verbally acknowledged these results. Electronically Signed   By: Stephen Madden   On: 05/12/2018 10:43   Ct Chest W Contrast  Result Date: 05/12/2018 CLINICAL DATA:  LEFT pneumothorax. Chest tube placed EXAM: CT CHEST WITH CONTRAST TECHNIQUE: Multidetector CT imaging of the chest was performed during intravenous contrast administration. CONTRAST:  36mL OMNIPAQUE IOHEXOL 300 MG/ML  SOLN COMPARISON:  Radiograph 05/12/2018 FINDINGS: Cardiovascular: Coronary artery calcification and aortic atherosclerotic calcification. Mediastinum/Nodes: No axillary or supraclavicular adenopathy. No mediastinal adenopathy. No pericardial effusion. Esophagus normal. Lungs/Pleura: Small bore chest tube enters the lateral chest wall with tip in the anterior aspect of the LEFT upper lobe pleural space. Interval near complete evacuation the LEFT pneumothorax. Small amount gas remains anterior to the inferior  lingula (several cubic cm). There is small amount of fluid within the LEFT hemithorax. The thickening along the LEFT oblique fissure suggests atelectasis. There is paraseptal emphysema at the apices. Central lobular emphysema with an upper lobe predominance. No suspicious pulmonary nodules. Upper Abdomen: Several low-density  lesions in the liver is consistent benign cysts. Adrenal glands normal. Musculoskeletal: No acute osseous abnormality. IMPRESSION: 1. Near complete evacuation of LEFT pneumothorax with small bore chest tube in place. 2. Atelectasis along the LEFT oblique fissure. 3. Small LEFT pleural fluid. 4. Bilateral paraseptal and centrilobular emphysema. Electronically Signed   By: Stephen Bouchard M.D.   On: 05/12/2018 16:07   Dg Chest Port 1 View  Result Date: 05/15/2018 CLINICAL DATA:  Postop EXAM: PORTABLE CHEST 1 VIEW COMPARISON:  05/14/2018 FINDINGS: Right mid lung platelike atelectasis. Left chest tube has been placed. No visible pneumothorax. Heart is borderline in size. No visible effusions or acute bony abnormality. IMPRESSION: Left chest tube in place without visible pneumothorax. Right mid lung atelectasis. Electronically Signed   By: Stephen Madden M.D.   On: 05/15/2018 18:15   Dg Chest Port 1 View  Result Date: 05/14/2018 CLINICAL DATA:  Left-sided chest tube came out. Pneumothorax. EXAM: PORTABLE CHEST 1 VIEW COMPARISON:  05/13/2018 FINDINGS: The heart size and mediastinal contours are within normal limits. Left-sided chest tube is no longer present. Trace left paramediastinal and apical pneumothorax without significant progression is identified. No mediastinal shift. The visualized skeletal structures are unremarkable. IMPRESSION: Left-sided chest tube is no longer present. Stable trace left-sided pneumothorax. Electronically Signed   By: Stephen Royalty M.D.   On: 05/14/2018 14:18   Dg Chest Port 1 View  Result Date: 05/13/2018 CLINICAL DATA:  Pneumothorax. EXAM: PORTABLE CHEST 1 VIEW COMPARISON:  Chest radiograph May 12, 2018 FINDINGS: LEFT chest tube with trace residual LEFT apical pneumothorax. LEFT lung base strandy densities. No pleural effusion or focal consolidation. No pneumothorax. Cardiomediastinal silhouette is normal. Mild calcific atherosclerosis aortic arch. Soft tissue planes and  included osseous structures are non suspicious. IMPRESSION: 1. Stable appearance of LEFT chest tube with trace residual pneumothorax. Electronically Signed   By: Stephen Alas M.D.   On: 05/13/2018 05:21   Dg Chest Portable 1 View  Result Date: 05/12/2018 CLINICAL DATA:  Follow-up pneumothorax.  Chest pain. EXAM: PORTABLE CHEST 1 VIEW COMPARISON:  Earlier film, same date. FINDINGS: The cardiac silhouette, mediastinal and hilar contours are within normal limits and stable. New small caliber left-sided chest tube in place and interval decrease in size of the left-sided pneumothorax. The apical portion of the pneumothorax has largely resolved. The basilar component is slightly smaller but still present at approximately 10-15%. The right lung is clear.  No right-sided pneumothorax. IMPRESSION: Interval placement of a small caliber left-sided chest tube with decrease in size of the left-sided pneumothorax as detailed above. Electronically Signed   By: Marijo Sanes M.D.   On: 05/12/2018 12:54    Consults:    Subjective:    Overnight Issues:  Quiet night, no further issues with desaturation.  Attaining oxygen saturations at 92% or better. Objective:  Vital signs for last 24 hours: Temp:  [97.4 F (36.3 C)-97.7 F (36.5 C)] 97.7 F (36.5 C) (12/27 0800) Pulse Rate:  [57-85] 83 (12/27 1000) Resp:  [10-33] 12 (12/27 1300) BP: (96-151)/(72-106) 121/90 (12/27 1300) SpO2:  [92 %-100 %] 94 % (12/27 1310) Weight:  [95.7 kg] 95.7 kg (12/27 0415)  Hemodynamic parameters for last 24 hours:  Reviewed.  Intake/Output from  previous day: 12/26 0701 - 12/27 0700 In: 1222.3 [I.V.:1122.3; IV Piggyback:100] Out: 962 [Urine:500; Chest Tube:53]  Intake/Output this shift: Total I/O In: 162.9 [I.V.:162.9] Out: 125 [Urine:125]  Vent settings for last 24 hours: N/A    Physical Exam:  General: Awake and alert, no respiratory distress. HEENT: Conjunctiva pink.  Sclera anicteric.  PERRL Neck: Supple,  no crepitus.  Trachea midline. Lungs: Good air entry bilaterally.  Somewhat distant sounds.  No wheezes or rhonchi noted. Cardiac: Regular rate rhythm, no rubs murmurs gallops heard. Abdomen: Soft, nondistended, normoactive bowel sounds. Extremities: No cyanosis, clubbing or edema noted. Neurologic/psych: Awake and alert, no focal deficits noted.  Mood is appropriate.  Assessment/Plan:  . 1.  Acute hypoxic respiratory failure/postoperative hypoxemia after bullectomy: This likely related to change in VQ relationship of the lung postoperatively, he has improved overnight.  Oxygen requirements are decreasing.  Should improve with ambulation. Continue supplemental oxygen and wean as tolerated to maintain oxygen saturations between 92 to 95%.  Patient also was noted to have significant atelectasis on the right lower lobe and this may be a contributing factor.  Continue flutter valve to assist with mucociliary clearance.  Begin mobilization.  2.  Chronic obstructive pulmonary disease with significant bullous emphysema: DuoNebs every 4 hours while awake while in-house.  Pulmonary toilet with flutter valve, incentive spirometer and out of bed as tolerated.  3.  Recurrent spontaneous pneumothorax: Status post bullectomy as above.    LOS: 4 days   Additional comments:Discussed during multidisciplinary rounds.  Patient may be transfer to surgical floor. Pulmonary/CCM will sign off please reconsult as needed.  Vernard Gambles 05/16/2018

## 2018-05-16 NOTE — Anesthesia Postprocedure Evaluation (Signed)
Anesthesia Post Note  Patient: Stephen Madden  Procedure(s) Performed: VIDEO ASSISTED THORACOSCOPY (VATS) W/TALC PLEUADESIS (Left )  Patient location during evaluation: A-ICU Anesthesia Type: General Level of consciousness: awake, oriented and awake and alert Pain management: pain level controlled Vital Signs Assessment: post-procedure vital signs reviewed and stable Respiratory status: spontaneous breathing and respiratory function stable Cardiovascular status: blood pressure returned to baseline Postop Assessment: no headache Anesthetic complications: no     Last Vitals:  Vitals:   05/16/18 0700 05/16/18 0800  BP: 106/75 106/79  Pulse: (!) 57 64  Resp: 10 12  Temp:  36.5 C  SpO2: 94% 94%    Last Pain:  Vitals:   05/16/18 0800  TempSrc: Oral  PainSc:                  Buckner Malta

## 2018-05-16 NOTE — Progress Notes (Signed)
Spoke with Dr. Genevive Bi on phone and made MD aware that patient set up on side of bed and did not call nurse for assistance and that when nurse entered room dressing to left midaxillary was covered in serosanguineous blood with continued drainage.  New dressing applied and appears that a stitch is slightly loose with trickling drainage but that chest tube did not pull apart and drainage coming from distal tube insertion site.  MD acknowledged and stated to continue to monitor the drainage and call him back if need be.

## 2018-05-16 NOTE — Progress Notes (Signed)
Pt arrived from PACU overnight with left-sided chest tube in place. Pt rested comfortably overnight with minimal chest tube output.

## 2018-05-16 NOTE — Addendum Note (Signed)
Addendum  created 05/16/18 0829 by Allean Found, CRNA   Clinical Note Signed

## 2018-05-16 NOTE — Progress Notes (Signed)
Patient ID: Stephen Madden, male   DOB: 1950-12-25, 67 y.o.   MRN: [id number]  He has some discomfort but this is been well controlled.  He is not short of breath.  He states that he is hungry.  He would like his Foley catheter removed.  His thoracoscopy sites are clean dry and intact.  There is no air leak.  His lungs are distant but equal.  His heart is regular.  We will transfer the patient to the floor today.

## 2018-05-16 NOTE — Anesthesia Postprocedure Evaluation (Signed)
Anesthesia Post Note  Patient: Stephen Madden  Procedure(s) Performed: VIDEO ASSISTED THORACOSCOPY (VATS) W/TALC PLEUADESIS (Left )  Patient location during evaluation: ICU Anesthesia Type: General Level of consciousness: awake and alert and oriented Pain management: pain level controlled Vital Signs Assessment: post-procedure vital signs reviewed and stable Respiratory status: spontaneous breathing and patient connected to nasal cannula oxygen Cardiovascular status: blood pressure returned to baseline Anesthetic complications: no     Last Vitals:  Vitals:   05/16/18 0600 05/16/18 0700  BP: 100/75 106/75  Pulse: (!) 58 (!) 57  Resp: 12 10  Temp:    SpO2: 95% 94%    Last Pain:  Vitals:   05/16/18 0400  TempSrc: Oral  PainSc: 3                  Adelae Yodice

## 2018-05-17 ENCOUNTER — Inpatient Hospital Stay: Payer: PPO

## 2018-05-17 DIAGNOSIS — J431 Panlobular emphysema: Secondary | ICD-10-CM

## 2018-05-17 LAB — CBC
HCT: 41.3 % (ref 39.0–52.0)
Hemoglobin: 13.5 g/dL (ref 13.0–17.0)
MCH: 30.7 pg (ref 26.0–34.0)
MCHC: 32.7 g/dL (ref 30.0–36.0)
MCV: 93.9 fL (ref 80.0–100.0)
NRBC: 0 % (ref 0.0–0.2)
Platelets: 186 10*3/uL (ref 150–400)
RBC: 4.4 MIL/uL (ref 4.22–5.81)
RDW: 12.9 % (ref 11.5–15.5)
WBC: 14 10*3/uL — ABNORMAL HIGH (ref 4.0–10.5)

## 2018-05-17 LAB — BASIC METABOLIC PANEL
ANION GAP: 9 (ref 5–15)
BUN: 33 mg/dL — ABNORMAL HIGH (ref 8–23)
CO2: 28 mmol/L (ref 22–32)
Calcium: 8.7 mg/dL — ABNORMAL LOW (ref 8.9–10.3)
Chloride: 98 mmol/L (ref 98–111)
Creatinine, Ser: 2.02 mg/dL — ABNORMAL HIGH (ref 0.61–1.24)
GFR calc Af Amer: 38 mL/min — ABNORMAL LOW (ref >60)
GFR calc non Af Amer: 33 mL/min — ABNORMAL LOW (ref >60)
Glucose, Bld: 121 mg/dL — ABNORMAL HIGH (ref 70–99)
Potassium: 3.8 mmol/L (ref 3.5–5.1)
Sodium: 135 mmol/L (ref 135–145)

## 2018-05-17 MED ORDER — SODIUM CHLORIDE 0.9 % IV SOLN
INTRAVENOUS | Status: DC
  Administered 2018-05-17 – 2018-05-19 (×5): via INTRAVENOUS

## 2018-05-17 MED ORDER — IPRATROPIUM-ALBUTEROL 0.5-2.5 (3) MG/3ML IN SOLN
3.0000 mL | Freq: Four times a day (QID) | RESPIRATORY_TRACT | Status: DC
  Administered 2018-05-18: 3 mL via RESPIRATORY_TRACT
  Filled 2018-05-17 (×2): qty 3

## 2018-05-17 MED ORDER — GUAIFENESIN ER 600 MG PO TB12
600.0000 mg | ORAL_TABLET | Freq: Two times a day (BID) | ORAL | Status: DC
  Administered 2018-05-17 – 2018-05-20 (×7): 600 mg via ORAL
  Filled 2018-05-17 (×7): qty 1

## 2018-05-17 MED ORDER — FLEET ENEMA 7-19 GM/118ML RE ENEM: 1.0000 | ENEMA | Freq: Once | RECTAL | Status: DC

## 2018-05-17 NOTE — Progress Notes (Signed)
  Patient ID: Stephen Madden, male   DOB: July 07, 1950, 67 y.o.   MRN: [id number]  HISTORY: He has had some issues with post surgical pain as well as a decreased appetite.  He also feels short of breath whenever he lies flat.  He did not eat much yesterday because he had a poor appetite.  He did have a bowel movement yesterday.   Vitals:   05/17/18 0728 05/17/18 0800  BP:  117/83  Pulse:  (!) 105  Resp:  (!) 24  Temp:    SpO2: 92% (!) 88%     EXAM:    Resp: Lungs are distant bilaterally and decreased on the left more than the right..  No respiratory distress, normal effort. Heart:  Regular without murmurs Abd:  Abdomen is soft, moderately distended and non tender. No masses are palpable.  There is no rebound and no guarding.  Neurological: Alert and oriented to person, place, and time. Coordination normal.  Skin: Skin is warm and dry. No rash noted. No diaphoretic. No erythema. No pallor.  Psychiatric: Normal mood and affect. Normal behavior. Judgment and thought content normal.   I did change all of his dressings today.  There is a small amount of serous drainage around the chest tube.  He has no air leak seen on the Pleur-evac.  His chest x-ray today does not reveal any evidence of pneumothorax or pleural effusion.  His creatinine however is up to 2.1.   ASSESSMENT: Recurrent pneumothorax status post bullectomy and talc pleurodesis.  Acute renal insufficiency   PLAN:   We will increase his fluids.  We will give the patient an enema to help with his abdominal distention.  I discussed his care today with Dr. Patsey Berthold and intensivist and Dr. Hampton Abbot who will cover.    Nestor Lewandowsky, MDPatient ID: Stephen Madden, male   DOB: May 07, 1951, 67 y.o.   MRN: [id number]

## 2018-05-17 NOTE — Progress Notes (Signed)
Added one additional ABD gauze layer to patient's left surgical incision dressing, as serosanguineous drainage appeared to fill the first dressing layer.    Patient states he did not rest well over night; had "difficulites getting comfortable" while lying in bed and requested additional pillow support for lower back region.  However, he had no complaints of pain related the surgical incision.

## 2018-05-17 NOTE — Progress Notes (Signed)
No acute issues overnight. Awaiting floor transfers. Medicated for pain with good effect. PCCM not actively following

## 2018-05-17 NOTE — Progress Notes (Signed)
Patient moved to room 203 by bed on 4 L nasal cannula with Joellen Jersey, Therapist, sports.  A&Ox4 with no distress noted when leaving ICU.  No BM yet although passing gas.  Patient refused enema this morning.

## 2018-05-17 NOTE — Progress Notes (Signed)
Follow up - Critical Care Medicine Note  Patient Details:    Stephen Madden is an 67 y.o. male.  67 year old male with recurrent spontaneous pneumothorax on the left, status post VATS bullectomy/pleurodesis.  Has underlying COPD on the basis of emphysema.   Lines, Airways, Drains: Chest Tube 1 Left;Lateral Pleural 32 Fr. (Active)  Suction -20 cm H2O 05/16/2018  8:45 AM  Chest Tube Air Leak None 05/16/2018  8:45 AM  Patency Intervention Milked 05/16/2018 12:00 AM  Drainage Description Serosanguineous 05/16/2018  8:45 AM  Dressing Status Clean;Dry;Intact 05/16/2018  8:45 AM  Site Assessment Other (Comment) 05/16/2018  8:45 AM  Surrounding Skin Unable to view 05/15/2018  7:05 PM  Output (mL) 0 mL 05/16/2018  4:08 AM    Anti-infectives:  Anti-infectives (From admission, onward)   Start     Dose/Rate Route Frequency Ordered Stop   05/15/18 2330  ceFAZolin (ANCEF) IVPB 2g/100 mL premix     2 g 200 mL/hr over 30 Minutes Intravenous Every 8 hours 05/15/18 1958 05/16/18 0913   05/15/18 1245  ceFAZolin (ANCEF) 2-4 GM/100ML-% IVPB    Note to Pharmacy:  Lyman Bishop   : cabinet override      05/15/18 1245 05/15/18 1539   05/14/18 2142  ceFAZolin (ANCEF) IVPB 2g/100 mL premix     2 g 200 mL/hr over 30 Minutes Intravenous 30 min pre-op 05/14/18 2142 05/15/18 1544      Microbiology: Results for orders placed or performed during the hospital encounter of 05/12/18  MRSA PCR Screening     Status: None   Collection Time: 05/15/18  7:36 PM  Result Value Ref Range Status   MRSA by PCR NEGATIVE NEGATIVE Final    Comment:        The GeneXpert MRSA Assay (FDA approved for NASAL specimens only), is one component of a comprehensive MRSA colonization surveillance program. It is not intended to diagnose MRSA infection nor to guide or monitor treatment for MRSA infections. Performed at [facility], [address], [place], [place] [address]     Best Practice/Protocols:  SCDs  for DVT prophylaxis, early ambulation.   Events: No untoward events.  Studies: Dg Chest 2 View  Result Date: 05/12/2018 CLINICAL DATA:  Cough, shortness of breath EXAM: CHEST - 2 VIEW COMPARISON:  03/17/2018 FINDINGS: The lungs are hyperinflated likely secondary to COPD. There is a small left pleural effusion. There is a moderate-sized left pneumothorax measuring approximately 25%. There is no right pneumothorax. There is no focal consolidation. Stable cardiomediastinal silhouette. The osseous structures are unremarkable. IMPRESSION: Moderate-sized acute left pneumothorax measuring 25%. Small left pleural effusion. Critical Value/emergent results were called by telephone at the time of interpretation on 05/12/2018 at 10:38 am to Dr. Nestor Lewandowsky , who verbally acknowledged these results. Electronically Signed   By: Kathreen Devoid   On: 05/12/2018 10:43   Ct Chest W Contrast  Result Date: 05/12/2018 CLINICAL DATA:  LEFT pneumothorax. Chest tube placed EXAM: CT CHEST WITH CONTRAST TECHNIQUE: Multidetector CT imaging of the chest was performed during intravenous contrast administration. CONTRAST:  32mL OMNIPAQUE IOHEXOL 300 MG/ML  SOLN COMPARISON:  Radiograph 05/12/2018 FINDINGS: Cardiovascular: Coronary artery calcification and aortic atherosclerotic calcification. Mediastinum/Nodes: No axillary or supraclavicular adenopathy. No mediastinal adenopathy. No pericardial effusion. Esophagus normal. Lungs/Pleura: Small bore chest tube enters the lateral chest wall with tip in the anterior aspect of the LEFT upper lobe pleural space. Interval near complete evacuation the LEFT pneumothorax. Small amount gas remains anterior to the inferior  lingula (several cubic cm). There is small amount of fluid within the LEFT hemithorax. The thickening along the LEFT oblique fissure suggests atelectasis. There is paraseptal emphysema at the apices. Central lobular emphysema with an upper lobe predominance. No suspicious  pulmonary nodules. Upper Abdomen: Several low-density lesions in the liver is consistent benign cysts. Adrenal glands normal. Musculoskeletal: No acute osseous abnormality. IMPRESSION: 1. Near complete evacuation of LEFT pneumothorax with small bore chest tube in place. 2. Atelectasis along the LEFT oblique fissure. 3. Small LEFT pleural fluid. 4. Bilateral paraseptal and centrilobular emphysema. Electronically Signed   By: Suzy Bouchard M.D.   On: 05/12/2018 16:07   Dg Chest Port 1 View  Result Date: 05/17/2018 CLINICAL DATA:  Acute respiratory failure EXAM: PORTABLE CHEST 1 VIEW COMPARISON:  Chest radiograph 05/15/2018 FINDINGS: Monitoring leads overlie the patient. Stable cardiac and mediastinal contours. Left chest tube stable in position. Bandlike opacities within the right lower thorax may represent scarring or atelectasis. No pleural effusion or pneumothorax. IMPRESSION: Stable position left chest tube without pneumothorax. Electronically Signed   By: Lovey Newcomer M.D.   On: 05/17/2018 08:04   Dg Chest Port 1 View  Result Date: 05/15/2018 CLINICAL DATA:  Postop EXAM: PORTABLE CHEST 1 VIEW COMPARISON:  05/14/2018 FINDINGS: Right mid lung platelike atelectasis. Left chest tube has been placed. No visible pneumothorax. Heart is borderline in size. No visible effusions or acute bony abnormality. IMPRESSION: Left chest tube in place without visible pneumothorax. Right mid lung atelectasis. Electronically Signed   By: Rolm Baptise M.D.   On: 05/15/2018 18:15   Dg Chest Port 1 View  Result Date: 05/14/2018 CLINICAL DATA:  Left-sided chest tube came out. Pneumothorax. EXAM: PORTABLE CHEST 1 VIEW COMPARISON:  05/13/2018 FINDINGS: The heart size and mediastinal contours are within normal limits. Left-sided chest tube is no longer present. Trace left paramediastinal and apical pneumothorax without significant progression is identified. No mediastinal shift. The visualized skeletal structures are  unremarkable. IMPRESSION: Left-sided chest tube is no longer present. Stable trace left-sided pneumothorax. Electronically Signed   By: Ashley Royalty M.D.   On: 05/14/2018 14:18   Dg Chest Port 1 View  Result Date: 05/13/2018 CLINICAL DATA:  Pneumothorax. EXAM: PORTABLE CHEST 1 VIEW COMPARISON:  Chest radiograph May 12, 2018 FINDINGS: LEFT chest tube with trace residual LEFT apical pneumothorax. LEFT lung base strandy densities. No pleural effusion or focal consolidation. No pneumothorax. Cardiomediastinal silhouette is normal. Mild calcific atherosclerosis aortic arch. Soft tissue planes and included osseous structures are non suspicious. IMPRESSION: 1. Stable appearance of LEFT chest tube with trace residual pneumothorax. Electronically Signed   By: Elon Alas M.D.   On: 05/13/2018 05:21   Dg Chest Portable 1 View  Result Date: 05/12/2018 CLINICAL DATA:  Follow-up pneumothorax.  Chest pain. EXAM: PORTABLE CHEST 1 VIEW COMPARISON:  Earlier film, same date. FINDINGS: The cardiac silhouette, mediastinal and hilar contours are within normal limits and stable. New small caliber left-sided chest tube in place and interval decrease in size of the left-sided pneumothorax. The apical portion of the pneumothorax has largely resolved. The basilar component is slightly smaller but still present at approximately 10-15%. The right lung is clear.  No right-sided pneumothorax. IMPRESSION: Interval placement of a small caliber left-sided chest tube with decrease in size of the left-sided pneumothorax as detailed above. Electronically Signed   By: Marijo Sanes M.D.   On: 05/12/2018 12:54    Consults:    Subjective:    Overnight Issues:  Quiet night,  had some issues with pain this morning and after pain medication did have some issues with desaturation.  We were asked to reevaluate him briefly.  Oxygen saturations were 86 to 88% on nasal cannula while sitting up.  Patient however was in no respiratory  distress.  He did not complain of any other issues and no significant incisional pain. Objective:  Vital signs for last 24 hours: Temp:  [97.1 F (36.2 C)-98.2 F (36.8 C)] 97.1 F (36.2 C) (12/28 1635) Pulse Rate:  [81-108] 90 (12/28 1635) Resp:  [14-24] 21 (12/28 1635) BP: (113-126)/(77-89) 113/86 (12/28 1635) SpO2:  [88 %-94 %] 91 % (12/28 1635)  Hemodynamic parameters for last 24 hours:  Reviewed.  Intake/Output from previous day: 12/27 0701 - 12/28 0700 In: 285.9 [P.O.:120; I.V.:165.9] Out: 395 [Urine:375; Chest Tube:20]  Intake/Output this shift: Total I/O In: 509.6 [P.O.:240; I.V.:269.6] Out: 10 [Chest Tube:10]  Vent settings for last 24 hours: N/A    Physical Exam:  General: Awake and alert, no respiratory distress. HEENT: Conjunctiva pink.  Sclera anicteric.  PERRL Neck: Supple, no crepitus.  Trachea midline. Lungs: Good air entry bilaterally.  Somewhat distant sounds.  Diffuse rhonchi noted today.  No wheezes.  Cardiac: Regular rate rhythm, no rubs murmurs gallops heard. Abdomen: Soft, nondistended, normoactive bowel sounds. Extremities: No cyanosis, clubbing or edema noted. Neurologic/psych: Awake and alert, no focal deficits noted.  Mood is appropriate.  Assessment/Plan:  . 1.  Acute hypoxic respiratory failure/postoperative hypoxemia after bullectomy: This likely related to change in VQ relationship of the lung postoperatively, he has had another issue with desaturation likely due to retained secretions. Continue supplemental oxygen and wean as tolerated to maintain oxygen saturations between 90 to 92%.  Patient also was noted to have significant atelectasis on the right lower lobe and this may be a contributing factor.  He had not received a flutter valve device ordered and therefore will institute this.  He will use it every 4 hours while awake.  Management will be with aggressive pulmonary toilet.  He is still in no distress and otherwise hemodynamically stable  currently oxygen saturations are 91% and from our standpoint can still be transferred to the surgical floor.  2.  Chronic obstructive pulmonary disease with significant bullous emphysema: DuoNebs every 4 hours while awake while in-house.  Pulmonary toilet with flutter valve, incentive spirometer and out of bed as tolerated.  3.  Recurrent spontaneous pneumothorax: Status post bullectomy as above.    LOS: 5 days   Additional comments: I discussed all the above with Dr. Genevive Bi.  Patient may be transferred to the surgical floor.  Renold Don, MD [facility]  05/17/2018

## 2018-05-17 NOTE — Progress Notes (Signed)
Report given to [place], RN on [place].  Patient will be moved to room 203.

## 2018-05-18 ENCOUNTER — Inpatient Hospital Stay: Payer: PPO

## 2018-05-18 LAB — CBC WITH DIFFERENTIAL/PLATELET
Abs Immature Granulocytes: 0.06 10*3/uL (ref 0.00–0.07)
BASOS ABS: 0 10*3/uL (ref 0.0–0.1)
Basophils Relative: 0 %
Eosinophils Absolute: 0.3 10*3/uL (ref 0.0–0.5)
Eosinophils Relative: 2 %
HCT: 42.3 % (ref 39.0–52.0)
Hemoglobin: 13.7 g/dL (ref 13.0–17.0)
Immature Granulocytes: 1 %
Lymphocytes Relative: 11 %
Lymphs Abs: 1.5 10*3/uL (ref 0.7–4.0)
MCH: 30.9 pg (ref 26.0–34.0)
MCHC: 32.4 g/dL (ref 30.0–36.0)
MCV: 95.3 fL (ref 80.0–100.0)
Monocytes Absolute: 1.4 10*3/uL — ABNORMAL HIGH (ref 0.1–1.0)
Monocytes Relative: 10 %
NEUTROS ABS: 10 10*3/uL — AB (ref 1.7–7.7)
Neutrophils Relative %: 76 %
Platelets: 203 10*3/uL (ref 150–400)
RBC: 4.44 MIL/uL (ref 4.22–5.81)
RDW: 13 % (ref 11.5–15.5)
WBC: 13.2 10*3/uL — ABNORMAL HIGH (ref 4.0–10.5)
nRBC: 0 % (ref 0.0–0.2)

## 2018-05-18 LAB — BASIC METABOLIC PANEL
ANION GAP: 9 (ref 5–15)
BUN: 40 mg/dL — ABNORMAL HIGH (ref 8–23)
CO2: 27 mmol/L (ref 22–32)
Calcium: 8.9 mg/dL (ref 8.9–10.3)
Chloride: 102 mmol/L (ref 98–111)
Creatinine, Ser: 1.8 mg/dL — ABNORMAL HIGH (ref 0.61–1.24)
GFR calc non Af Amer: 38 mL/min — ABNORMAL LOW (ref >60)
GFR, EST AFRICAN AMERICAN: 44 mL/min — AB (ref >60)
Glucose, Bld: 121 mg/dL — ABNORMAL HIGH (ref 70–99)
Potassium: 3.9 mmol/L (ref 3.5–5.1)
Sodium: 138 mmol/L (ref 135–145)

## 2018-05-18 MED ORDER — POLYETHYLENE GLYCOL 3350 17 G PO PACK
17.0000 g | PACK | Freq: Two times a day (BID) | ORAL | Status: DC
  Filled 2018-05-18 (×4): qty 1

## 2018-05-18 MED ORDER — IPRATROPIUM-ALBUTEROL 0.5-2.5 (3) MG/3ML IN SOLN
3.0000 mL | Freq: Three times a day (TID) | RESPIRATORY_TRACT | Status: DC
  Administered 2018-05-18 – 2018-05-20 (×6): 3 mL via RESPIRATORY_TRACT
  Filled 2018-05-18 (×8): qty 3

## 2018-05-18 MED ORDER — OXYCODONE HCL 5 MG PO TABS: 5.0000 mg | ORAL_TABLET | ORAL | Status: DC | PRN

## 2018-05-18 MED ORDER — ACETAMINOPHEN 500 MG PO TABS: 1000.0000 mg | ORAL_TABLET | Freq: Four times a day (QID) | ORAL | Status: DC | PRN

## 2018-05-18 NOTE — Progress Notes (Signed)
Patient unable to walk at this time.  He had just returned from the bathroom and said he was too short of breath

## 2018-05-18 NOTE — Progress Notes (Signed)
[facility] Hospital Day(s): 6.   Post op day(s): 3 Days Post-Op.   Interval History: Patient seen and examined, no acute events or new complaints overnight. Patient reports has not have bowel movement, denies nausea or vomiting. Denies chest pain or shortness of breath.  Vital signs in last 24 hours: [min-max] current  Temp:  [97.1 F (36.2 C)-98.6 F (37 C)] 97.5 F (36.4 C) (12/29 0528) Pulse Rate:  [81-104] 104 (12/29 0812) Resp:  [19-24] 20 (12/29 0528) BP: (113-143)/(77-95) 137/78 (12/29 0812) SpO2:  [90 %-96 %] 96 % (12/29 0816)     Height: 6' 4.5" (194.3 cm) Weight: 95.7 kg BMI (Calculated): 25.35   Physical Exam:  Constitutional: alert, cooperative and no distress  Respiratory: breathing non-labored at rest. Decreased sound on the left chest.   Cardiovascular: regular rate and sinus rhythm  Gastrointestinal: soft, non-tender, and mild-distended  Labs:  CBC Latest Ref Rng & Units 05/18/2018 05/17/2018 05/15/2018  WBC 4.0 - 10.5 K/uL 13.2(H) 14.0(H) 13.6(H)  Hemoglobin 13.0 - 17.0 g/dL 13.7 13.5 14.4  Hematocrit 39.0 - 52.0 % 42.3 41.3 44.1  Platelets 150 - 400 K/uL 203 186 198   CMP Latest Ref Rng & Units 05/18/2018 05/17/2018 05/15/2018  Glucose 70 - 99 mg/dL 121(H) 121(H) 185(H)  BUN 8 - 23 mg/dL 40(H) 33(H) 18  Creatinine 0.61 - 1.24 mg/dL 1.80(H) 2.02(H) 1.09  Sodium 135 - 145 mmol/L 138 135 138  Potassium 3.5 - 5.1 mmol/L 3.9 3.8 3.8  Chloride 98 - 111 mmol/L 102 98 104  CO2 22 - 32 mmol/L 27 28 27   Calcium 8.9 - 10.3 mg/dL 8.9 8.7(L) 9.0  Total Protein 6.5 - 8.1 g/dL - - -  Total Bilirubin 0.3 - 1.2 mg/dL - - -  Alkaline Phos 38 - 126 U/L - - -  AST 15 - 41 U/L - - -  ALT 0 - 44 U/L - - -    Imaging studies:  EXAM: PORTABLE CHEST 1 VIEW  COMPARISON:  Chest x-ray 05/09/2018.  FINDINGS: Left-sided chest tube in position with tip in the apex of the left hemithorax. No appreciable pneumothorax. Elevation of the left hemidiaphragm is  similar to the prior study. No acute consolidative airspace disease. Mild linear scarring in the right mid to lower lung. No pleural effusions. No evidence of pulmonary edema. Heart size is normal. Upper mediastinal contours are within normal limits. Aortic atherosclerosis.  IMPRESSION: 1. Stable position of left-sided chest tube. No appreciable pneumothorax. 2. Low lung volumes with persistent elevation of the left hemidiaphragm. 3. Aortic atherosclerosis.   Electronically Signed   By: Vinnie Langton M.D.   On: 05/18/2018 07:51   Assessment/Plan:  67 y.o. male with recurrent left pneumothorax 3 Days Post-Op s/p bullectomy and talc pleurodesis. Improved renal function. There is atelectasis of the left lung, Will keep chest tube to suction. Possible removal tomorrow by Dr. Genevive Bi.  Patient constipated most likely due to pain medications.  Arnold Long, MD

## 2018-05-19 ENCOUNTER — Inpatient Hospital Stay: Payer: PPO

## 2018-05-19 ENCOUNTER — Ambulatory Visit: Payer: PPO

## 2018-05-19 ENCOUNTER — Ambulatory Visit: Payer: PPO | Admitting: Radiation Oncology

## 2018-05-19 LAB — BASIC METABOLIC PANEL
Anion gap: 8 (ref 5–15)
BUN: 27 mg/dL — ABNORMAL HIGH (ref 8–23)
CO2: 26 mmol/L (ref 22–32)
CREATININE: 1.23 mg/dL (ref 0.61–1.24)
Calcium: 8.8 mg/dL — ABNORMAL LOW (ref 8.9–10.3)
Chloride: 107 mmol/L (ref 98–111)
GFR calc non Af Amer: 60 mL/min — ABNORMAL LOW (ref >60)
Glucose, Bld: 110 mg/dL — ABNORMAL HIGH (ref 70–99)
Potassium: 3.8 mmol/L (ref 3.5–5.1)
Sodium: 141 mmol/L (ref 135–145)

## 2018-05-19 LAB — SURGICAL PATHOLOGY

## 2018-05-19 MED ORDER — SIMETHICONE 80 MG PO CHEW
80.0000 mg | CHEWABLE_TABLET | Freq: Four times a day (QID) | ORAL | Status: DC | PRN
  Administered 2018-05-19 (×2): 80 mg via ORAL
  Filled 2018-05-19 (×4): qty 1

## 2018-05-19 NOTE — Progress Notes (Signed)
Follow Up - Pulmonary Medicine Note  Patient Details:    Stephen Madden is an 67 y.o. male.  67 year old male with recurrent spontaneous pneumothorax on the left, status post VATS bullectomy/pleurodesis.  Has underlying COPD on the basis of emphysema.   Lines, Airways, Drains: Chest Tube 1 Left;Lateral Pleural 32 Fr. (Active)  Suction -20 cm H2O 05/16/2018  8:45 AM  Chest Tube Air Leak None 05/16/2018  8:45 AM  Patency Intervention Milked 05/16/2018 12:00 AM  Drainage Description Serosanguineous 05/16/2018  8:45 AM  Dressing Status Clean;Dry;Intact 05/16/2018  8:45 AM  Site Assessment Other (Comment) 05/16/2018  8:45 AM  Surrounding Skin Unable to view 05/15/2018  7:05 PM  Output (mL) 0 mL 05/16/2018  4:08 AM    Anti-infectives:  Anti-infectives (From admission, onward)   Start     Dose/Rate Route Frequency Ordered Stop   05/15/18 2330  ceFAZolin (ANCEF) IVPB 2g/100 mL premix     2 g 200 mL/hr over 30 Minutes Intravenous Every 8 hours 05/15/18 1958 05/16/18 0913   05/15/18 1245  ceFAZolin (ANCEF) 2-4 GM/100ML-% IVPB    Note to Pharmacy:  Lyman Bishop   : cabinet override      05/15/18 1245 05/15/18 1539   05/14/18 2142  ceFAZolin (ANCEF) IVPB 2g/100 mL premix     2 g 200 mL/hr over 30 Minutes Intravenous 30 min pre-op 05/14/18 2142 05/15/18 1544      Microbiology: Results for orders placed or performed during the hospital encounter of 05/12/18  MRSA PCR Screening     Status: None   Collection Time: 05/15/18  7:36 PM  Result Value Ref Range Status   MRSA by PCR NEGATIVE NEGATIVE Final    Comment:        The GeneXpert MRSA Assay (FDA approved for NASAL specimens only), is one component of a comprehensive MRSA colonization surveillance program. It is not intended to diagnose MRSA infection nor to guide or monitor treatment for MRSA infections. Performed at [facility], [address] [place], [place] [address]     Best Practice/Protocols:  SCDs for  DVT prophylaxis, early ambulation.   Events: No untoward events.  Studies: Dg Chest 2 View  Result Date: 05/12/2018 CLINICAL DATA:  Cough, shortness of breath EXAM: CHEST - 2 VIEW COMPARISON:  03/17/2018 FINDINGS: The lungs are hyperinflated likely secondary to COPD. There is a small left pleural effusion. There is a moderate-sized left pneumothorax measuring approximately 25%. There is no right pneumothorax. There is no focal consolidation. Stable cardiomediastinal silhouette. The osseous structures are unremarkable. IMPRESSION: Moderate-sized acute left pneumothorax measuring 25%. Small left pleural effusion. Critical Value/emergent results were called by telephone at the time of interpretation on 05/12/2018 at 10:38 am to Dr. Nestor Lewandowsky , who verbally acknowledged these results. Electronically Signed   By: Kathreen Devoid   On: 05/12/2018 10:43   Ct Chest W Contrast  Result Date: 05/12/2018 CLINICAL DATA:  LEFT pneumothorax. Chest tube placed EXAM: CT CHEST WITH CONTRAST TECHNIQUE: Multidetector CT imaging of the chest was performed during intravenous contrast administration. CONTRAST:  84mL OMNIPAQUE IOHEXOL 300 MG/ML  SOLN COMPARISON:  Radiograph 05/12/2018 FINDINGS: Cardiovascular: Coronary artery calcification and aortic atherosclerotic calcification. Mediastinum/Nodes: No axillary or supraclavicular adenopathy. No mediastinal adenopathy. No pericardial effusion. Esophagus normal. Lungs/Pleura: Small bore chest tube enters the lateral chest wall with tip in the anterior aspect of the LEFT upper lobe pleural space. Interval near complete evacuation the LEFT pneumothorax. Small amount gas remains anterior to the inferior lingula (  several cubic cm). There is small amount of fluid within the LEFT hemithorax. The thickening along the LEFT oblique fissure suggests atelectasis. There is paraseptal emphysema at the apices. Central lobular emphysema with an upper lobe predominance. No suspicious  pulmonary nodules. Upper Abdomen: Several low-density lesions in the liver is consistent benign cysts. Adrenal glands normal. Musculoskeletal: No acute osseous abnormality. IMPRESSION: 1. Near complete evacuation of LEFT pneumothorax with small bore chest tube in place. 2. Atelectasis along the LEFT oblique fissure. 3. Small LEFT pleural fluid. 4. Bilateral paraseptal and centrilobular emphysema. Electronically Signed   By: Suzy Bouchard M.D.   On: 05/12/2018 16:07   Dg Chest Port 1 View  Result Date: 05/18/2018 CLINICAL DATA:  67 year old male with history of left-sided pneumothorax status post chest tube placement. EXAM: PORTABLE CHEST 1 VIEW COMPARISON:  Chest x-ray 05/09/2018. FINDINGS: Left-sided chest tube in position with tip in the apex of the left hemithorax. No appreciable pneumothorax. Elevation of the left hemidiaphragm is similar to the prior study. No acute consolidative airspace disease. Mild linear scarring in the right mid to lower lung. No pleural effusions. No evidence of pulmonary edema. Heart size is normal. Upper mediastinal contours are within normal limits. Aortic atherosclerosis. IMPRESSION: 1. Stable position of left-sided chest tube. No appreciable pneumothorax. 2. Low lung volumes with persistent elevation of the left hemidiaphragm. 3. Aortic atherosclerosis. Electronically Signed   By: Vinnie Langton M.D.   On: 05/18/2018 07:51   Dg Chest Port 1 View  Result Date: 05/17/2018 CLINICAL DATA:  Acute respiratory failure EXAM: PORTABLE CHEST 1 VIEW COMPARISON:  Chest radiograph 05/15/2018 FINDINGS: Monitoring leads overlie the patient. Stable cardiac and mediastinal contours. Left chest tube stable in position. Bandlike opacities within the right lower thorax may represent scarring or atelectasis. No pleural effusion or pneumothorax. IMPRESSION: Stable position left chest tube without pneumothorax. Electronically Signed   By: Lovey Newcomer M.D.   On: 05/17/2018 08:04   Dg Chest  Port 1 View  Result Date: 05/15/2018 CLINICAL DATA:  Postop EXAM: PORTABLE CHEST 1 VIEW COMPARISON:  05/14/2018 FINDINGS: Right mid lung platelike atelectasis. Left chest tube has been placed. No visible pneumothorax. Heart is borderline in size. No visible effusions or acute bony abnormality. IMPRESSION: Left chest tube in place without visible pneumothorax. Right mid lung atelectasis. Electronically Signed   By: Rolm Baptise M.D.   On: 05/15/2018 18:15   Dg Chest Port 1 View  Result Date: 05/14/2018 CLINICAL DATA:  Left-sided chest tube came out. Pneumothorax. EXAM: PORTABLE CHEST 1 VIEW COMPARISON:  05/13/2018 FINDINGS: The heart size and mediastinal contours are within normal limits. Left-sided chest tube is no longer present. Trace left paramediastinal and apical pneumothorax without significant progression is identified. No mediastinal shift. The visualized skeletal structures are unremarkable. IMPRESSION: Left-sided chest tube is no longer present. Stable trace left-sided pneumothorax. Electronically Signed   By: Ashley Royalty M.D.   On: 05/14/2018 14:18   Dg Chest Port 1 View  Result Date: 05/13/2018 CLINICAL DATA:  Pneumothorax. EXAM: PORTABLE CHEST 1 VIEW COMPARISON:  Chest radiograph May 12, 2018 FINDINGS: LEFT chest tube with trace residual LEFT apical pneumothorax. LEFT lung base strandy densities. No pleural effusion or focal consolidation. No pneumothorax. Cardiomediastinal silhouette is normal. Mild calcific atherosclerosis aortic arch. Soft tissue planes and included osseous structures are non suspicious. IMPRESSION: 1. Stable appearance of LEFT chest tube with trace residual pneumothorax. Electronically Signed   By: Elon Alas M.D.   On: 05/13/2018 05:21   Dg  Chest Portable 1 View  Result Date: 05/12/2018 CLINICAL DATA:  Follow-up pneumothorax.  Chest pain. EXAM: PORTABLE CHEST 1 VIEW COMPARISON:  Earlier film, same date. FINDINGS: The cardiac silhouette, mediastinal and  hilar contours are within normal limits and stable. New small caliber left-sided chest tube in place and interval decrease in size of the left-sided pneumothorax. The apical portion of the pneumothorax has largely resolved. The basilar component is slightly smaller but still present at approximately 10-15%. The right lung is clear.  No right-sided pneumothorax. IMPRESSION: Interval placement of a small caliber left-sided chest tube with decrease in size of the left-sided pneumothorax as detailed above. Electronically Signed   By: Marijo Sanes M.D.   On: 05/12/2018 12:54    Consults:    Subjective:    Overnight Issues:  No overnight issues.  He is doing well.  Sitting up in chair.  Still complains of excessive secretions but is now using Acapella to help with clearance.  Encourage use of Acapella.  Oxygen requirement down to 2 L and continues to wean off. Objective:  Vital signs for last 24 hours: Temp:  [97.8 F (36.6 C)-98.3 F (36.8 C)] 98.2 F (36.8 C) (12/30 0501) Pulse Rate:  [88-92] 90 (12/30 0501) Resp:  [20-24] 20 (12/30 0501) BP: (125-137)/(87-90) 136/90 (12/30 0501) SpO2:  [91 %-97 %] 94 % (12/30 0937)  Hemodynamic parameters for last 24 hours:  Reviewed.  Intake/Output from previous day: 12/29 0701 - 12/30 0700 In: 848.8 [I.V.:848.8] Out: 910 [Urine:900; Chest Tube:10]  Intake/Output this shift: Total I/O In: 360 [P.O.:360] Out: -   Vent settings for last 24 hours: N/A    Physical Exam:  General: Awake and alert, no respiratory distress. HEENT: Conjunctiva pink.  Sclera anicteric.  PERRL Neck: Supple, no crepitus.  Trachea midline. Lungs: Good air entry bilaterally.  Somewhat distant sounds.  Diffuse rhonchi persist.  No wheezes.   Cardiac: Regular rate rhythm, no rubs murmurs gallops heard. Abdomen: Soft, nondistended, normoactive bowel sounds. Extremities: No cyanosis, clubbing or edema noted. Neurologic/psych: Awake and alert, no focal deficits noted.  Mood  is appropriate.  Assessment/Plan:  . 1.  Acute hypoxic respiratory failure/postoperative hypoxemia after bullectomy:  Continue supplemental oxygen and wean off as tolerated to maintain oxygen saturations between 90% better.  Patient also was noted to have significant atelectasis on the right lower lobe and this may be a contributing factor.  He had not been using flutter device, he was coached on the proper use of the device and encouraged touse it every 4 hours while awake.  Continue aggressive pulmonary toilet.  He is in no distress otherwise.  Chest tube still in place will obtain chest x-ray to follow.  2.  Chronic obstructive pulmonary disease with significant bullous emphysema: DuoNebs every 4 hours while awake while in-house.  Pulmonary toilet with flutter valve, incentive spirometer and out of bed as tolerated.  3.  Recurrent spontaneous pneumothorax: Status post bullectomy as above.    LOS: 7 days   We will continue to follow peripherally, please call with questions.Renold Don, MD [facility]  05/19/2018

## 2018-05-19 NOTE — Care Management Important Message (Signed)
Copy of signed Medicare IM left with patient in room. 

## 2018-05-19 NOTE — Progress Notes (Signed)
Patient ambulating independently in room; up to chair all day.  Encouraged patient to ambulate in hallway however patient stated he "did not want to"; he feels he will be better able to ambulate when "the tube comes out".

## 2018-05-19 NOTE — Progress Notes (Signed)
Patient ID: Stephen Madden, male   DOB: Sep 13, 1950, 67 y.o.   MRN: [id number]  He feels better today.  He had several bowel movements yesterday and does not feel short of breath this morning.  His pain is under good control.  He does not have an air leak today.  I place his chest tube to waterseal.  His lungs actually sound better today with better air entry on the left and they are clear.  His thoracoscopy sites are clean dry and intact.  The dressing around the chest tube has minimal serous drainage.  We will change that today.  I have placed the patient to waterseal and will repeat his chest x-ray this afternoon.  The plan will be to remove his chest tube tomorrow.  We will try to wean his oxygen today.  I did tell him that it may be necessary to send him home with oxygen and will ask our social workers to arrange for that.  His creatinine is slowly improving.  His urine output remains good.  Tim [employer]

## 2018-05-20 ENCOUNTER — Inpatient Hospital Stay: Payer: PPO

## 2018-05-20 MED ORDER — OXYCODONE HCL 5 MG PO TABS: 5.0000 mg | ORAL_TABLET | Freq: Four times a day (QID) | ORAL | 0 refills | Status: DC | PRN

## 2018-05-20 NOTE — Discharge Summary (Signed)
[facility] SURGICAL DISCHARGE SUMMARY   Patient ID: Stephen Madden MRN: [id number] DOB/AGE: 12-09-1950 67 y.o.  Admit date: 05/12/2018 Discharge date: 05/20/2018  Discharge Diagnoses Left recurrent PTX COPD   Consultants PCCM  Procedures Preoperative bronchoscopy; left thoracoscopy with apical bullectomy; talc pleurodesis  HPI: Stephen Madden is a 67 y.o. male who presented to the ED on 12/23 for a recurrent left pneumothorax and had a left chest tube placed in ED and was admitted to thoracic surgery.   Hospital Course: After 2 days of chest tube suction the patient elected to undergo thoracoscopy and blebectomy as well as tal pleurodesis. Informed consent was obtained and documented, and patient underwent uneventful left thoracoscopy with apical bullectomy; talc pleurodesis (Dr Genevive Bi, MD, 05/15/2018).  Post-operatively, patient's pain and breathing improved/resolved and advancement of patient's diet and ambulation were well-tolerated. PCCM was consulted for additional post-operative management given the patients COPD history. The patient's chest tube remained in place until it was removed on the day of discharge on 12/31. The remainder of patient's hospital course was essentially unremarkable, and discharge planning was initiated accordingly with patient safely able to be discharged home with appropriate discharge instructions, pain control, and outpatient follow-up after all of hisv questions were answered to his expressed satisfaction.  Patient will require 2L of home oxygen given his persistent hypoxia with ambulation and history of COPD.   Discharge Condition: Good   Allergies as of 05/20/2018   No Known Allergies     Medication List    TAKE these medications   albuterol 108 (90 Base) MCG/ACT inhaler Commonly known as:  PROVENTIL HFA;VENTOLIN HFA Inhale 2 puffs into the lungs every 4 (four) hours as needed for wheezing or shortness of breath.   atenolol 50 MG  tablet Commonly known as:  TENORMIN Take 50 mg by mouth every morning.   BREO ELLIPTA 200-25 MCG/INH Aepb Generic drug:  fluticasone furoate-vilanterol Inhale 1 puff into the lungs daily as needed (for shortness of breath or wheezing).   clotrimazole-betamethasone cream Commonly known as:  LOTRISONE Apply 1 application topically 2 (two) times daily as needed (for irritation).   dextromethorphan-guaiFENesin 30-600 MG 12hr tablet Commonly known as:  MUCINEX DM Take 1 tablet by mouth 2 (two) times daily as needed for cough.   latanoprost 0.005 % ophthalmic solution Commonly known as:  XALATAN Place 1 drop into both eyes at bedtime.   NIFEdipine 30 MG 24 hr tablet Commonly known as:  ADALAT CC Take 30 mg by mouth every morning.   oxyCODONE 5 MG immediate release tablet Commonly known as:  Oxy IR/ROXICODONE Take 1-2 tablets (5-10 mg total) by mouth every 6 (six) hours as needed for severe pain or breakthrough pain.            Durable Medical Equipment  (From admission, onward)         Start     Ordered   05/20/18 1318  For home use only DME oxygen  Once    Question Answer Comment  Mode or (Route) Nasal cannula   Liters per Minute 2   Frequency Continuous (stationary and portable oxygen unit needed)   Oxygen conserving device No   Oxygen delivery system Gas      05/20/18 1317           Follow-up Information    Nestor Lewandowsky, MD. Schedule an appointment as soon as possible for a visit on 05/30/2018.   Specialties:  Cardiothoracic Surgery, General Surgery Why:  s/p pleurodesis  with Genevive Bi, needs appointment next friday (1/10) and CXR prior to appointment Contact information: [address] [place] [address] [place] , PA-C [facility]  05/20/2018, 1:22 PM [phone] M-F: 7am - 4pm

## 2018-05-20 NOTE — Care Management Note (Signed)
Case Management Note  Patient Details  Name: Stephen Madden MRN: [id number] Date of Birth: 11-Dec-1950   Patient to discharge today. Patient states that he lives at home with wife.  PCP Masoud. Pharmacy [facility].  Denies issues with transportation or with obtaining medication.  No home medical equipment. Patient declines any home health nursing services at discharge.  Brad with [facility] to deliver portable O2 prior to discharge.   Subjective/Objective:                    Action/Plan:   Expected Discharge Date:  05/20/18               Expected Discharge Plan:  Home/Self Care  In-House Referral:     Discharge planning Services  CM Consult  Post Acute Care Choice:  Durable Medical Equipment Choice offered to:  Patient  DME Arranged:  Oxygen DME Agency:  [place]:  Patient Refused [place] Agency:     Status of Service:  Completed, signed off  If discussed at [place] of Stay Meetings, dates discussed:    Additional Comments:  Beverly Sessions, RN 05/20/2018, 2:27 PM

## 2018-05-20 NOTE — Progress Notes (Signed)
05/20/2018 1:02 PM   SATURATION QUALIFICATIONS: (This note is used to comply with regulatory documentation for home oxygen)  Patient Saturations on Room Air at Rest = 96%  Patient Saturations on Room Air while Ambulating = 83%  Patient Saturations on 2 Liters of oxygen while Ambulating = 95%  Please briefly explain why patient needs home oxygen:  Patient Sp02 dropped significantly with ambulation, requiring 2LO2 via nasal cannula to restore adequate saturation.  Dola Argyle, RN

## 2018-05-20 NOTE — Progress Notes (Signed)
Patient ID: Stephen Madden, male   DOB: 1950/09/13, 67 y.o.   MRN: [id number]  He has no specific complaints today.  He states that he does get short of breath when he moves around in the room but attributes this to the presence of his chest tube.  His appetite is been okay.  His oxygen saturations are 100% on 2 L and we will try to wean that today.  His lungs are diminished bilaterally.  His heart is regular.  His thoracoscopy wounds are all clean dry and intact.  I have reviewed his pathology.  It shows only emphysematous blebs.  We will check his chest x-ray today.  There is no air leak and if his chest x-ray looks okay we will remove his chest tube and discharge the patient.  I will also try to wean his oxygen today.  All of his questions were answered.  I would like to see him back in the office in about 2 weeks.

## 2018-05-23 ENCOUNTER — Telehealth: Payer: Self-pay

## 2018-05-23 NOTE — Telephone Encounter (Signed)
Flagged on EMMI report for not having a follow up scheduled.  Called and spoke with patient.  He confirms he scheduled his appointment with Dr. Genevive Bi for next Friday.  No other questions or concerns at this time.  I thanked him for his time and informed him he would receive one more automated call checking in over the next few days.

## 2018-05-27 DIAGNOSIS — J9383 Other pneumothorax: Secondary | ICD-10-CM | POA: Diagnosis not present

## 2018-05-27 DIAGNOSIS — J939 Pneumothorax, unspecified: Secondary | ICD-10-CM | POA: Diagnosis not present

## 2018-05-27 DIAGNOSIS — J449 Chronic obstructive pulmonary disease, unspecified: Secondary | ICD-10-CM | POA: Diagnosis not present

## 2018-05-30 ENCOUNTER — Encounter: Payer: Self-pay | Admitting: Cardiothoracic Surgery

## 2018-05-30 ENCOUNTER — Other Ambulatory Visit: Payer: Self-pay

## 2018-05-30 ENCOUNTER — Ambulatory Visit
Admission: RE | Admit: 2018-05-30 | Discharge: 2018-05-30 | Disposition: A | Discharge status: Home / Self Care | Payer: PPO | Source: Ambulatory Visit | Attending: Cardiothoracic Surgery | Admitting: Cardiothoracic Surgery

## 2018-05-30 ENCOUNTER — Ambulatory Visit (INDEPENDENT_AMBULATORY_CARE_PROVIDER_SITE_OTHER): Payer: PPO | Admitting: Cardiothoracic Surgery

## 2018-05-30 ENCOUNTER — Ambulatory Visit
Admission: RE | Admit: 2018-05-30 | Discharge: 2018-05-30 | Disposition: A | Discharge status: Home / Self Care | Payer: PPO | Attending: Cardiothoracic Surgery | Admitting: Cardiothoracic Surgery

## 2018-05-30 VITALS — BP 124/78 | HR 84 | Temp 96.8°F | Resp 18 | Ht 77.0 in | Wt 198.8 lb

## 2018-05-30 DIAGNOSIS — J430 Unilateral pulmonary emphysema [MacLeod's syndrome]: Secondary | ICD-10-CM

## 2018-05-30 DIAGNOSIS — J90 Pleural effusion, not elsewhere classified: Secondary | ICD-10-CM | POA: Diagnosis not present

## 2018-05-30 DIAGNOSIS — J9383 Other pneumothorax: Secondary | ICD-10-CM

## 2018-05-30 DIAGNOSIS — J939 Pneumothorax, unspecified: Secondary | ICD-10-CM

## 2018-05-30 NOTE — Progress Notes (Signed)
Stephen Madden returns today in follow-up.  He has no specific complaints.  His main issue relates to his prostate malignancy and his desire to proceed on with the volumetric studies and ultimately with his radiotherapy.  Today his thoracoscopy sites are healing as expected.  I removed a single remaining stitch along the chest tube.  His chest x-ray today was independently reviewed by me and shows no evidence of pleural effusion or pneumothorax.  His lungs are quite diminished bilaterally.  His heart is regular.  I would like him to follow-up with someone in the [place] pulmonary regarding his COPD.  He is agreeable to doing that.  He will come back to see [place] as needed.

## 2018-05-30 NOTE — Patient Instructions (Signed)
We will send the referral to Pulmonary medicine. Someone from their office will contact you within 5-7 days. If you do not hear  from anyone within the time frame listed above please call our office.

## 2018-06-05 ENCOUNTER — Telehealth: Payer: Self-pay | Admitting: Cardiothoracic Surgery

## 2018-06-05 NOTE — Telephone Encounter (Signed)
Notified patient that Dr Genevive Bi will write RX tomorrow, he is aware Dr Genevive Bi is not in the office today, he will send his wife over in the morning to pick up.

## 2018-06-05 NOTE — Telephone Encounter (Signed)
Patient is calling and is asking if Dr. Genevive Bi would send another refill in for his  Oxycodone, but said he has tried Aleve and tylenol, patient said its hurting in his Chest. Please call patient and advise.

## 2018-06-06 ENCOUNTER — Other Ambulatory Visit: Payer: Self-pay

## 2018-06-06 MED ORDER — OXYCODONE HCL 5 MG PO TABS: 5.0000 mg | ORAL_TABLET | Freq: Four times a day (QID) | ORAL | 0 refills | Status: AC | PRN

## 2018-06-06 NOTE — Progress Notes (Signed)
* [facility]     Assessment and Plan:  COPD/Emphysema with history of spontaneous pneumothorax.  -Severe bilateral apical bullous emphysema.  Status post recurrent left spontaneous pneumothorax with pleurodesis on 05/15/2018. - Discussed with patient that the chance of a recurrent left-sided pneumothorax is very low now that he has had a pleurodesis.  However he remains at risk of a spontaneous right pneumothorax.  We discussed potential ways to reduce the risk which include avoiding activities which could cause physical trauma or avoiding changes in ambient pressure.  This includes avoiding climbing up ladders, avoiding contact sports, avoiding trips to altitude above 5000 feet for prolonged periods of time. -Avoid pulmonary function testing/spirometry.  Dyspnea on exertion. - He has not yet started to be active since his surgery. - I have asked him to start using a treadmill for 30 minutes a day, slowly increase his activity as tolerated. - He should use Breo once daily, and albuterol MDI as needed. - We discussed that the best thing he can do for his breathing is to continue to avoid smoking and try to increase his activity in addition to using inhaled medications.  Pre-operative respiratory exam.  - Patient is being planned for prostate seeding.  I have informed him that he is cleared for surgery from respiratory standpoint, though he would like to wait longer as he has concerns about his immunity.  Return in about 6 months (around 12/08/2018).   Date: 06/09/2018  MRN# [id number] Stephen Madden [birth year]   Stephen Madden is a 68 y.o. old male seen in follow up for chief complaint of  Chief Complaint  Patient presents with  . Consult    States he was DX with emphysema. States he has had SOB for the last several months that has slowly gotten worse. States he is having most of his complication on his left side. He states at night he can feel pain in his left lung.       HPI:   Patient is a 68 year old male, he is previously been seen by the [facility] service while inpatient in December 2019.  At that time he presented to the hospital on 05/12/2018 with left-sided chest discomfort and dyspnea which reminded him of previous pneumothorax in the previous admission in October.  He was found to have a recurrent pneumothorax.  He was subsequently taken to the OR on 05/15/2018 and underwent left thoracoscopy with apical bullectomy; talc pleurodesis.  He was subsequently referred here for further management of his COPD.  He last smoked in July 2018, no vaping. His wife smokes. He feels that his breathing is ok. He is still recovering from the recent surgery. Before the lung collapse he was diong 1.5 miles in 30 min on a treadmill. This was after the first lung collapse, he did not exercise before that. He did some yard work and was not winded. He could walk a [employer] without a problem.  He has not really gone back to being active since the surgery.  He has Breo but is not using it regularly. He notes that when he used it he breathed better.  He was discharged on oxygen but uses it occasionally when he is at home.   **CBC 05/18/18>> Abs eos 300.  **CT chest 10/10/2017 chest x-ray 05/30/2018>> there is severe apical emphysema seen on the CT chest, on chest x-ray there is hyperinflation suggestive of emphysema.  Not significantly changed in comparison with previous film from 09/29/2013. **CBC 05/18/2018>> absolute  eosinophil count equals 300  Medication:    Current Outpatient Medications:  .  albuterol (PROVENTIL HFA;VENTOLIN HFA) 108 (90 Base) MCG/ACT inhaler, Inhale 2 puffs into the lungs every 4 (four) hours as needed for wheezing or shortness of breath., Disp: , Rfl:  .  atenolol (TENORMIN) 50 MG tablet, Take 50 mg by mouth every morning. , Disp: , Rfl:  .  clotrimazole-betamethasone (LOTRISONE) cream, Apply 1 application topically 2 (two) times daily as needed (for  irritation)., Disp: , Rfl:  .  dextromethorphan-guaiFENesin (MUCINEX DM) 30-600 MG 12hr tablet, Take 1 tablet by mouth 2 (two) times daily as needed for cough. , Disp: , Rfl:  .  fluticasone furoate-vilanterol (BREO ELLIPTA) 200-25 MCG/INH AEPB, Inhale 1 puff into the lungs daily as needed (for shortness of breath or wheezing)., Disp: , Rfl:  .  latanoprost (XALATAN) 0.005 % ophthalmic solution, Place 1 drop into both eyes at bedtime., Disp: , Rfl:  .  NIFEdipine (PROCARDIA-XL/ADALAT CC) 30 MG 24 hr tablet, Take 30 mg by mouth every morning. , Disp: , Rfl:  .  oxyCODONE (OXY IR/ROXICODONE) 5 MG immediate release tablet, Take 1-2 tablets (5-10 mg total) by mouth every 6 (six) hours as needed for up to 7 days for severe pain or breakthrough pain., Disp: 20 tablet, Rfl: 0   Allergies:  Patient has no known allergies.   Review of Systems:  Constitutional: Feels well. Cardiovascular: Denies chest pain, exertional chest pain.  Pulmonary: Denies hemoptysis, pleuritic chest pain.   The remainder of systems were reviewed and were found to be negative other than what is documented in the HPI.    Physical Examination:   VS: BP 128/80   Pulse 95   Ht 6\' 5"  (1.956 m)   Wt 198 lb (89.8 kg)   SpO2 90%   BMI 23.48 kg/m   General Appearance: No distress  Neuro:without focal findings, mental status, speech normal, alert and oriented HEENT: PERRLA, EOM intact Pulmonary: No wheezing, No rales  CardiovascularNormal S1,S2.  No m/r/g.  Abdomen: Benign, Soft, non-tender, No masses Renal:  No costovertebral tenderness  GU:  No performed at this time. Endoc: No evident thyromegaly, no signs of acromegaly or Cushing features Skin:   warm, no rashes, no ecchymosis  Extremities: normal, no cyanosis, clubbing.     LABORATORY PANEL:   CBC No results for input(s): WBC, HGB, HCT, PLT in the last 168  hours. ------------------------------------------------------------------------------------------------------------------  Chemistries  No results for input(s): NA, K, CL, CO2, GLUCOSE, BUN, CREATININE, CALCIUM, MG, AST, ALT, ALKPHOS, BILITOT in the last 168 hours.  Invalid input(s): GFRCGP ------------------------------------------------------------------------------------------------------------------  Cardiac Enzymes No results for input(s): TROPONINI in the last 168 hours. ------------------------------------------------------------  RADIOLOGY:   No results found for this or any previous visit. Results for orders placed during the hospital encounter of 05/30/18  DG Chest 2 View   Narrative CLINICAL DATA:  Pneumothorax  EXAM: CHEST - 2 VIEW  COMPARISON:  05/20/2018  FINDINGS: Left chest tube has been removed. There is no pneumothorax. Pleural and parenchymal changes at the left base have improved. There is persistent blunting of the left costophrenic angle. A small residual pleural effusion may be present.  IMPRESSION: Left chest tube removal without pneumothorax  Small residual left pleural effusion.   Electronically Signed   By: Marybelle Killings M.D.   On: 05/30/2018 13:04    ------------------------------------------------------------------------------------------------------------------  Thank  you for allowing [facility] to assist in the care of your patient. Our recommendations are  noted above.  Please contact us if we can be of further service.   Marda Stalker, M.D., F.C.C.P.  Board Certified in Internal Medicine, Pulmonary Medicine, [place], and Sleep Medicine.  [facility] Office Number: [phone]  06/09/2018

## 2018-06-06 NOTE — Progress Notes (Signed)
Per Dr.Oaks patient will be given a new prescription for the oxycodone. Patient has been informed that he will need to pick up the prescription from the office.

## 2018-06-09 ENCOUNTER — Encounter: Payer: Self-pay | Admitting: Internal Medicine

## 2018-06-09 ENCOUNTER — Ambulatory Visit: Payer: PPO | Admitting: Internal Medicine

## 2018-06-09 VITALS — BP 128/80 | HR 95 | Ht 77.0 in | Wt 198.0 lb

## 2018-06-09 DIAGNOSIS — J9383 Other pneumothorax: Secondary | ICD-10-CM | POA: Diagnosis not present

## 2018-06-09 DIAGNOSIS — J439 Emphysema, unspecified: Secondary | ICD-10-CM | POA: Diagnosis not present

## 2018-06-09 NOTE — Patient Instructions (Addendum)
Avoid situations which could cause physical trauma like falling from a ladder, contact sports.  Avoid long exposures to changes in pressure such as scuba diving, going to the mountains above 5000 feet.   Use Breo daily, use proair as needed.  Try to increase your activitywalking as tolerated.   Continue to use oxygen when your oxygen level is below 90%. When on the treadmill, if the oxygen level drops below 90%, increase the oxygen and try to continue exercising. Use albuterol if you get winded.

## 2018-06-17 ENCOUNTER — Encounter
Admission: RE | Disposition: A | Discharge status: Home / Self Care | Payer: Self-pay | Source: Ambulatory Visit | Attending: Urology

## 2018-06-17 SURGERY — INSERTION, RADIATION SOURCE, PROSTATE
Anesthesia: Choice

## 2018-06-25 MED ORDER — FENTANYL CITRATE (PF) 100 MCG/2ML IJ SOLN: 25.0000 ug | INTRAMUSCULAR | Status: DC | PRN

## 2018-06-25 MED ORDER — ONDANSETRON HCL 4 MG/2ML IJ SOLN: 4.0000 mg | Freq: Once | INTRAMUSCULAR | Status: DC | PRN

## 2018-06-27 DIAGNOSIS — J449 Chronic obstructive pulmonary disease, unspecified: Secondary | ICD-10-CM | POA: Diagnosis not present

## 2018-06-27 DIAGNOSIS — J9383 Other pneumothorax: Secondary | ICD-10-CM | POA: Diagnosis not present

## 2018-06-27 DIAGNOSIS — J939 Pneumothorax, unspecified: Secondary | ICD-10-CM | POA: Diagnosis not present

## 2018-07-01 ENCOUNTER — Ambulatory Visit: Payer: PPO

## 2018-07-01 ENCOUNTER — Ambulatory Visit: Payer: PPO | Admitting: Radiation Oncology

## 2018-07-15 ENCOUNTER — Ambulatory Visit: Payer: PPO | Admitting: Radiation Oncology

## 2018-07-15 ENCOUNTER — Ambulatory Visit: Payer: PPO

## 2018-07-17 ENCOUNTER — Ambulatory Visit: Payer: PPO | Admitting: Radiation Oncology

## 2018-07-18 ENCOUNTER — Other Ambulatory Visit: Payer: Self-pay

## 2018-07-18 ENCOUNTER — Encounter
Admission: RE | Admit: 2018-07-18 | Discharge: 2018-07-18 | Disposition: A | Discharge status: Home / Self Care | Payer: PPO | Source: Ambulatory Visit | Attending: Urology | Admitting: Urology

## 2018-07-18 NOTE — Pre-Procedure Instructions (Signed)
EKG 05/12/18 FROM ED. SEE NOTES FROM ED VISIT FOR INTERPRETATION.

## 2018-07-18 NOTE — Patient Instructions (Addendum)
Your procedure is scheduled on: 07/22/2018 and 08/26/2018 Report to [facility]. To find out your arrival time please call [phone] between 1PM - 3PM on 07/21/2018 and 08/25/2018.  Remember: Instructions that are not followed completely may result in serious medical risk, up to and including death, or upon the discretion of your surgeon and anesthesiologist your surgery may need to be rescheduled.     _X__ 1. Do not eat food after midnight the night before your procedure.                 No gum chewing or hard candies. You may drink clear liquids up to 2 hours                 before you are scheduled to arrive for your surgery- DO not drink clear                 liquids within 2 hours of the start of your surgery.                 Clear Liquids include:  water, apple juice without pulp, clear carbohydrate                 drink such as Clearfast or Gatorade, Black Coffee or Tea (Do not add                 anything to coffee or tea).  __X__2.  On the morning of surgery brush your teeth with toothpaste and water, you                 may rinse your mouth with mouthwash if you wish.  Do not swallow any              toothpaste of mouthwash.     _X__ 3.  No Alcohol for 24 hours before or after surgery.   _X__ 4.  Do Not Smoke or use e-cigarettes For 24 Hours Prior to Your Surgery.                 Do not use any chewable tobacco products for at least 6 hours prior to                 surgery.  ____  5.  Bring all medications with you on the day of surgery if instructed.   __X__  6.  Notify your doctor if there is any change in your medical condition      (cold, fever, infections).     Do not wear jewelry, make-up, hairpins, clips or nail polish. Do not wear lotions, powders, or perfumes.  Do not shave 48 hours prior to surgery. Men may shave face and neck. Do not bring valuables to the hospital.    [facility] is not responsible for any  belongings or valuables.  Contacts, dentures/partials or body piercings may not be worn into surgery. Bring a case for your contacts, glasses or hearing aids, a denture cup will be supplied. Leave your suitcase in the car. After surgery it may be brought to your room. For patients admitted to the hospital, discharge time is determined by your treatment team.   Patients discharged the day of surgery will not be allowed to drive home.   Please read over the following fact sheets that you were given:   MRSA Information  __X__ Take these medicines the morning of surgery with A SIP OF WATER:  1. atenolol (TENORMIN)  2. NIFEdipine (PROCARDIA-XL/ADALAT CC)  3.   4.  5.  6.  __X__ Fleet Enema (as directed) USE MORNING OF PROSTATE SEEDED  ____ Use CHG Soap/SAGE wipes as directed  __X__ Use inhalers on the day of surgery  ____ Stop metformin/Janumet/Farxiga 2 days prior to surgery    ____ Take 1/2 of usual insulin dose the night before surgery. No insulin the morning          of surgery.   ____ Stop Blood Thinners Coumadin/Plavix/Xarelto/Pleta/Pradaxa/Eliquis/Effient/Aspirin  on   Or contact your Surgeon, Cardiologist or Medical Doctor regarding  ability to stop your blood thinners  __X__ Stop Anti-inflammatories 7 days before surgery such as Advil, Ibuprofen, Motrin,  BC or Goodies Powder, Naprosyn, Naproxen, Aleve, Aspirin    __X__ Stop all herbal supplements, fish oil or vitamin E until after surgery.    ____ Bring C-Pap to the hospital.

## 2018-07-18 NOTE — Pre-Procedure Instructions (Signed)
pHONE INTERVIEW WITH PATIENT TO UPDATE MEDICAL/SURGICAL HISTORY AND MEDICATIONS. pROVIDED INSTRUCTIONS FOR UPCOMING VOLUME STUDY ON 07/22/18 AND PROSTATE SEEDING 08/26/18. pT VERBALIZED UNDERSTANDING. wILL CALL IF HE HAS ANY OTHER QUESTIONS REGARDING UPCOMING PROCEDURES.

## 2018-07-21 ENCOUNTER — Encounter: Payer: Self-pay | Admitting: Anesthesiology

## 2018-07-22 ENCOUNTER — Ambulatory Visit: Payer: PPO | Admitting: Anesthesiology

## 2018-07-22 ENCOUNTER — Ambulatory Visit
Admission: RE | Admit: 2018-07-22 | Discharge: 2018-07-22 | Disposition: A | Discharge status: Home / Self Care | Payer: PPO | Source: Ambulatory Visit | Attending: Radiation Oncology | Admitting: Radiation Oncology

## 2018-07-22 ENCOUNTER — Encounter: Payer: Self-pay | Admitting: *Deleted

## 2018-07-22 ENCOUNTER — Other Ambulatory Visit: Payer: Self-pay

## 2018-07-22 ENCOUNTER — Ambulatory Visit
Admission: RE | Admit: 2018-07-22 | Discharge: 2018-07-22 | Disposition: A | Discharge status: Home / Self Care | Payer: PPO | Source: Ambulatory Visit | Attending: Urology | Admitting: Urology

## 2018-07-22 ENCOUNTER — Encounter
Admission: RE | Disposition: A | Discharge status: Home / Self Care | Payer: Self-pay | Source: Ambulatory Visit | Attending: Urology

## 2018-07-22 DIAGNOSIS — Z87891 Personal history of nicotine dependence: Secondary | ICD-10-CM | POA: Diagnosis not present

## 2018-07-22 DIAGNOSIS — J449 Chronic obstructive pulmonary disease, unspecified: Secondary | ICD-10-CM | POA: Diagnosis not present

## 2018-07-22 DIAGNOSIS — C61 Malignant neoplasm of prostate: Secondary | ICD-10-CM | POA: Diagnosis not present

## 2018-07-22 DIAGNOSIS — Z79899 Other long term (current) drug therapy: Secondary | ICD-10-CM | POA: Diagnosis not present

## 2018-07-22 DIAGNOSIS — Z510 Encounter for antineoplastic radiation therapy: Secondary | ICD-10-CM | POA: Insufficient documentation

## 2018-07-22 DIAGNOSIS — I10 Essential (primary) hypertension: Secondary | ICD-10-CM | POA: Insufficient documentation

## 2018-07-22 HISTORY — PX: VOLUME STUDY: SHX6646

## 2018-07-22 HISTORY — DX: Malignant (primary) neoplasm, unspecified: C80.1

## 2018-07-22 SURGERY — ULTRASOUND, PROSTATE, FOR VOLUME DETERMINATION
Anesthesia: Monitor Anesthesia Care | Site: Rectum

## 2018-07-22 MED ORDER — PROPOFOL 500 MG/50ML IV EMUL
INTRAVENOUS | Status: DC | PRN
  Administered 2018-07-22: 50 ug/kg/min via INTRAVENOUS

## 2018-07-22 MED ORDER — EPHEDRINE SULFATE 50 MG/ML IJ SOLN
INTRAMUSCULAR | Status: AC
  Filled 2018-07-22: qty 1

## 2018-07-22 MED ORDER — PROPOFOL 500 MG/50ML IV EMUL
INTRAVENOUS | Status: AC
  Filled 2018-07-22: qty 50

## 2018-07-22 MED ORDER — EPHEDRINE SULFATE 50 MG/ML IJ SOLN
INTRAMUSCULAR | Status: DC | PRN
  Administered 2018-07-22: 5 mg via INTRAVENOUS

## 2018-07-22 MED ORDER — MIDAZOLAM HCL 2 MG/2ML IJ SOLN
INTRAMUSCULAR | Status: AC
  Filled 2018-07-22: qty 2

## 2018-07-22 MED ORDER — FENTANYL CITRATE (PF) 100 MCG/2ML IJ SOLN
INTRAMUSCULAR | Status: AC
  Filled 2018-07-22: qty 2

## 2018-07-22 MED ORDER — ONDANSETRON HCL 4 MG/2ML IJ SOLN
INTRAMUSCULAR | Status: AC
  Filled 2018-07-22: qty 2

## 2018-07-22 MED ORDER — DEXMEDETOMIDINE HCL IN NACL 200 MCG/50ML IV SOLN
INTRAVENOUS | Status: AC
  Filled 2018-07-22: qty 50

## 2018-07-22 MED ORDER — FENTANYL CITRATE (PF) 100 MCG/2ML IJ SOLN: 25.0000 ug | INTRAMUSCULAR | Status: DC | PRN

## 2018-07-22 MED ORDER — LIDOCAINE HCL (PF) 2 % IJ SOLN
INTRAMUSCULAR | Status: AC
  Filled 2018-07-22: qty 10

## 2018-07-22 MED ORDER — ONDANSETRON HCL 4 MG/2ML IJ SOLN
INTRAMUSCULAR | Status: DC | PRN
  Administered 2018-07-22: 4 mg via INTRAVENOUS

## 2018-07-22 MED ORDER — DEXMEDETOMIDINE HCL 200 MCG/2ML IV SOLN
INTRAVENOUS | Status: DC | PRN
  Administered 2018-07-22 (×3): 4 ug via INTRAVENOUS

## 2018-07-22 MED ORDER — MIDAZOLAM HCL 2 MG/2ML IJ SOLN
INTRAMUSCULAR | Status: DC | PRN
  Administered 2018-07-22: 2 mg via INTRAVENOUS

## 2018-07-22 MED ORDER — ONDANSETRON HCL 4 MG/2ML IJ SOLN: 4.0000 mg | Freq: Once | INTRAMUSCULAR | Status: DC | PRN

## 2018-07-22 MED ORDER — FENTANYL CITRATE (PF) 100 MCG/2ML IJ SOLN
INTRAMUSCULAR | Status: DC | PRN
  Administered 2018-07-22 (×2): 25 ug via INTRAVENOUS

## 2018-07-22 MED ORDER — LACTATED RINGERS IV SOLN
INTRAVENOUS | Status: DC | PRN
  Administered 2018-07-22: 08:00:00 via INTRAVENOUS

## 2018-07-22 MED ORDER — PROPOFOL 10 MG/ML IV BOLUS
INTRAVENOUS | Status: DC | PRN
  Administered 2018-07-22 (×2): 30 mg via INTRAVENOUS

## 2018-07-22 NOTE — Interval H&P Note (Signed)
History and Physical Interval Note:  07/22/2018 7:58 AM  Stephen Madden  has presented today for surgery, with the diagnosis of Prostate cancer  The various methods of treatment have been discussed with the patient and family. After consideration of risks, benefits and other options for treatment, the patient has consented to  Procedure(s): VOLUME STUDY (N/A) as a surgical intervention .  The patient's history has been reviewed, patient examined, no change in status, stable for surgery.  I have reviewed the patient's chart and labs.  Questions were answered to the patient's satisfaction.     Luis M. Cintron

## 2018-07-22 NOTE — Anesthesia Preprocedure Evaluation (Signed)
Anesthesia Evaluation  Patient identified by MRN, date of birth, ID band Patient awake    Reviewed: Allergy & Precautions, NPO status , Patient's Chart, lab work & pertinent test results, reviewed documented beta blocker date and time   Airway Mallampati: III  TM Distance: >3 FB     Dental  (+) Chipped   Pulmonary COPD, former smoker,           Cardiovascular hypertension, Pt. on medications and Pt. on home beta blockers      Neuro/Psych    GI/Hepatic   Endo/Other    Renal/GU Renal disease     Musculoskeletal   Abdominal   Peds  Hematology   Anesthesia Other Findings EKG shows 1 deg block. Quit smoke 2 yr.  Reproductive/Obstetrics                             Anesthesia Physical Anesthesia Plan  ASA: III  Anesthesia Plan: MAC   Post-op Pain Management:    Induction:   PONV Risk Score and Plan:   Airway Management Planned:   Additional Equipment:   Intra-op Plan:   Post-operative Plan:   Informed Consent: I have reviewed the patients History and Physical, chart, labs and discussed the procedure including the risks, benefits and alternatives for the proposed anesthesia with the patient or authorized representative who has indicated his/her understanding and acceptance.       Plan Discussed with: CRNA  Anesthesia Plan Comments:         Anesthesia Quick Evaluation

## 2018-07-22 NOTE — Progress Notes (Signed)
Radiation Oncology Follow up Note  Name: Stephen Madden   Date:   07/22/2018 MRN:  [id number] DOB: 09/08/1950    This 68 y.o. male presents to the Hospital today for volume study in anticipation I-125 interstitial implant for Gleason 6 adenocarcinoma the prostate  REFERRING PROVIDER: Cletis Athens, MD  HPI: patient is a 68 year old male was originally consult back in August when he had an elevated PSA of 4.5 ultrasound-guided biopsy showing 3 of 12 cores positive for Gleason 6 adenocarcinoma. He was evaluated by radiation oncology has waited was time to agreed to go ahead with I-125 interstitial implant. He needs general anesthesia for volume study. He is otherwise doing well has some mild elevated lower urinary tract symptoms..he is seen in the hospital today under general anesthesia for volume study.  COMPLICATIONS OF TREATMENT: none  FOLLOW UP COMPLIANCE: keeps appointments   PHYSICAL EXAM:  There were no vitals taken for this visit. Well-developed well-nourished patient in NAD. HEENT reveals PERLA, EOMI, discs not visualized.  Oral cavity is clear. No oral mucosal lesions are identified. Neck is clear without evidence of cervical or supraclavicular adenopathy. Lungs are clear to A&P. Cardiac examination is essentially unremarkable with regular rate and rhythm without murmur rub or thrill. Abdomen is benign with no organomegaly or masses noted. Motor sensory and DTR levels are equal and symmetric in the upper and lower extremities. Cranial nerves II through XII are grossly intact. Proprioception is intact. No peripheral adenopathy or edema is identified. No motor or sensory levels are noted. Crude visual fields are within normal range.  RADIOLOGY RESULTS: ultrasound used for biopsy  PLAN: Patient was taken to the cystoscopy suite in the OR. Patient was placed in the low lithotomy position. Foley catheter was placed. Trans-rectal ultrasound probe was inserted into the rectum and prostate seminal  vesicles were visualized as well as bladder base. stepping images were performed on a 5 mm increments. Images will be placed in BrachyVision treatment planning system to determine seed placement coordinates for eventual I-125 interstitial implant. Images will be reviewed with the physics and dosimetry staff for final quality approval. I personally was present for the volume study and assisted in delineation of contour volumes.  At the end of the procedure Foley catheter was removed, rectal ultrasound probe was removed. Patient tolerated his procedures extremely well with no side effects or complaints. Patient has given appointment for interstitial implant date. Consent was signed today as well as history and physical performed in preparation for his outpatient surgical implant.      Noreene Filbert, MD

## 2018-07-22 NOTE — Anesthesia Postprocedure Evaluation (Signed)
Anesthesia Post Note  Patient: Stephen Madden  Procedure(s) Performed: VOLUME STUDY (N/A Rectum)  Patient location during evaluation: PACU Anesthesia Type: MAC Level of consciousness: awake and alert Pain management: pain level controlled Vital Signs Assessment: post-procedure vital signs reviewed and stable Respiratory status: spontaneous breathing, nonlabored ventilation, respiratory function stable and patient connected to nasal cannula oxygen Cardiovascular status: stable and blood pressure returned to baseline Postop Assessment: no apparent nausea or vomiting Anesthetic complications: no     Last Vitals:  Vitals:   07/22/18 0916 07/22/18 0948  BP: 123/80 114/76  Pulse: 61 61  Resp: 18 18  Temp:  36.6 C  SpO2: 93% 93%    Last Pain:  Vitals:   07/22/18 0948  TempSrc: Axillary  PainSc: 0-No pain                 Namon Villarin S

## 2018-07-22 NOTE — Anesthesia Post-op Follow-up Note (Signed)
Anesthesia QCDR form completed.        

## 2018-07-22 NOTE — H&P (Signed)
   07/22/2018 7:55 AM   [name] [birth year] [id number]  Referring provider: No referring provider defined for this encounter.   HPI: 68 year old male with low risk prostate cancer presents for transrectal ultrasound prostate with volume study for brachytherapy planning.   PMH: Past Medical History:  Diagnosis Date  . BPH with obstruction/lower urinary tract symptoms   . Cancer [facility])    prostate  . COPD (chronic obstructive pulmonary disease) ([place])   . Elevated PSA   . Hypertension   . Pneumothorax 02/23/2018   left  . Tobacco abuse     Surgical History: Past Surgical History:  Procedure Laterality Date  . COLON SURGERY    . COLONOSCOPY WITH PROPOFOL N/A 05/30/2015   Procedure: COLONOSCOPY WITH PROPOFOL;  Surgeon: Lucilla Lame, MD;  Location: [place];  Service: Endoscopy;  Laterality: N/A;  . COLONOSCOPY WITH PROPOFOL N/A 08/28/2016   Procedure: COLONOSCOPY WITH PROPOFOL;  Surgeon: Lucilla Lame, MD;  Location: [facility];  Service: Endoscopy;  Laterality: N/A;  . CYSTOSCOPY W/ RETROGRADES Bilateral 07/11/2015   Procedure: CYSTOSCOPY WITH RETROGRADE PYELOGRAM;  Surgeon: Hollice Espy, MD;  Location: [facility];  Service: Urology;  Laterality: Bilateral;  . CYSTOSCOPY WITH STENT PLACEMENT Bilateral 07/11/2015   Procedure: CYSTOSCOPY WITH STENT PLACEMENT;  Surgeon: Hollice Espy, MD;  Location: [facility];  Service: Urology;  Laterality: Bilateral;  . LAPAROSCOPIC PARTIAL COLECTOMY N/A 07/11/2015   Procedure: Laparoscopic right hemicolectomy;  Surgeon: Hubbard Robinson, MD;  Location: [facility];  Service: General;  Laterality: N/A;  . POLYPECTOMY  05/30/2015   Procedure: POLYPECTOMY;  Surgeon: Lucilla Lame, MD;  Location: [place];  Service: Endoscopy;;  . VIDEO ASSISTED THORACOSCOPY (VATS) W/TALC PLEUADESIS Left 05/15/2018   Procedure: VIDEO ASSISTED THORACOSCOPY (VATS) W/TALC PLEUADESIS;  Surgeon: Nestor Lewandowsky, MD;  Location: [facility];  Service: Thoracic;   Laterality: Left;    Home Medications:  Reviewed  Allergies: No Known Allergies  Family History: Family History  Problem Relation Age of Onset  . Heart disease Mother   . Stroke Father   . Cancer Neg Hx   . Kidney disease Neg Hx   . Prostate cancer Neg Hx   . Bladder Cancer Neg Hx     Social History:  reports that he quit smoking about 20 months ago. His smoking use included cigarettes. He has a 30.00 pack-year smoking history. He has never used smokeless tobacco. He reports previous alcohol use. He reports that he does not use drugs.  ROS: Noncontributory  Physical Exam: BP (!) 129/95   Pulse 83   Temp 98.2 F (36.8 C) (Tympanic)   Resp 16   Ht 6\' 5"  (1.956 m)   Wt 95.3 kg   SpO2 95%   BMI 24.90 kg/m   Constitutional:  Alert and oriented, No acute distress. HEENT: [place] AT, moist mucus membranes.  Trachea midline, no masses. Cardiovascular: No clubbing, cyanosis, or edema. RRR Respiratory: Normal respiratory effort, no increased work of breathing. Clear GI: Abdomen is soft, nontender, nondistended, no abdominal masses GU: No CVA tenderness Lymph: No cervical or inguinal lymphadenopathy. Skin: No rashes, bruises or suspicious lesions. Neurologic: Grossly intact, no focal deficits, moving all 4 extremities. Psychiatric: Normal mood and affect.   Assessment & Plan:   68 year old male with low risk prostate cancer presents for transrectal ultrasound/volume study for radiation planning.   Abbie Sons, [place] [address] [place], [place] [address] [phone]

## 2018-07-22 NOTE — Discharge Instructions (Signed)

## 2018-07-22 NOTE — Progress Notes (Signed)
[place] visited with pt and wife together pre-op. Pt shared that he was ready to get the procedure over with. Pt also shared he had been recently hospitalized in December for several days. Ch allowed time for pt to lament about his health challenges. Pt shared he was here for a volume study. Ch provided words of encouragement and prayed for healing and restoration. Pt should be going home after recovery.     07/22/18 0740  Clinical Encounter Type  Visited With Patient and family together  Visit Type Psychological support;Spiritual support;Social support;Pre-op  Spiritual Encounters  Spiritual Needs Prayer;Emotional;Grief support  Stress Factors  Patient Stress Factors Exhausted;Major life changes  Family Stress Factors None identified

## 2018-07-22 NOTE — Transfer of Care (Signed)
Immediate Anesthesia Transfer of Care Note  Patient: Stephen Madden  Procedure(s) Performed: VOLUME STUDY (N/A Rectum)  Patient Location: PACU  Anesthesia Type:MAC  Level of Consciousness: awake  Airway & Oxygen Therapy: Patient Spontanous Breathing and Patient connected to nasal cannula oxygen  Post-op Assessment: Report given to RN and Post -op Vital signs reviewed and stable  Post vital signs: stable  Last Vitals:  Vitals Value Taken Time  BP 108/71 07/22/2018  8:46 AM  Temp 36.4 C 07/22/2018  8:46 AM  Pulse 72 07/22/2018  8:49 AM  Resp 13 07/22/2018  8:49 AM  SpO2 99 % 07/22/2018  8:49 AM  Vitals shown include unvalidated device data.  Last Pain:  Vitals:   07/22/18 0846  TempSrc:   PainSc: 0-No pain      Patients Stated Pain Goal: 0 (37/09/64 3838)  Complications: No apparent anesthesia complications

## 2018-07-23 ENCOUNTER — Encounter: Payer: Self-pay | Admitting: Radiation Oncology

## 2018-07-23 ENCOUNTER — Other Ambulatory Visit: Payer: Self-pay

## 2018-07-23 ENCOUNTER — Ambulatory Visit
Admission: RE | Admit: 2018-07-23 | Discharge: 2018-07-23 | Disposition: A | Discharge status: Home / Self Care | Payer: PPO | Source: Ambulatory Visit | Attending: Radiation Oncology | Admitting: Radiation Oncology

## 2018-07-23 VITALS — BP 136/87 | HR 77 | Resp 18 | Wt 197.6 lb

## 2018-07-23 DIAGNOSIS — Z87891 Personal history of nicotine dependence: Secondary | ICD-10-CM | POA: Diagnosis not present

## 2018-07-23 DIAGNOSIS — I10 Essential (primary) hypertension: Secondary | ICD-10-CM | POA: Insufficient documentation

## 2018-07-23 DIAGNOSIS — Z79899 Other long term (current) drug therapy: Secondary | ICD-10-CM | POA: Insufficient documentation

## 2018-07-23 DIAGNOSIS — J449 Chronic obstructive pulmonary disease, unspecified: Secondary | ICD-10-CM | POA: Diagnosis not present

## 2018-07-23 DIAGNOSIS — C61 Malignant neoplasm of prostate: Secondary | ICD-10-CM

## 2018-07-23 NOTE — H&P (Signed)
NEW PATIENT EVALUATION  Name: Stephen Madden  MRN: [id number]  Date:   07/23/2018     DOB: 07-04-1950   This 68 y.o. male patient presents to the clinic for History and physical prior to I-125 interstitial implant for adenocarcinoma the prostate  REFERRING PHYSICIAN: Cletis Athens, MD  CHIEF COMPLAINT:  Chief Complaint  Patient presents with  . Prostate Cancer    Post volume study    DIAGNOSIS: The encounter diagnosis was Malignant neoplasm of prostate ([place]).   PREVIOUS INVESTIGATIONS: prior notes reviewed  HPI: patient is a 68 year old male was originally consult back in August when he had an elevated PSA of 4.5 ultrasound-guided biopsy showing 3 of 12 cores positive for Gleason 6 adenocarcinoma. He was evaluated by radiation oncology has waited was time to agreed to go ahead with I-125 interstitial implant. He needs general anesthesia for volume study. He is otherwise doing well has some mild elevated lower urinary tract symptoms.Marland Kitchen   PLANNED TREATMENT REGIMEN:  volumeStudy followed by I-125 interstitial implant  PAST MEDICAL HISTORY:  has a past medical history of BPH with obstruction/lower urinary tract symptoms, Cancer ([place]), COPD (chronic obstructive pulmonary disease) ([place]), Elevated PSA, Hypertension, Pneumothorax (02/23/2018), and Tobacco abuse.    PAST SURGICAL HISTORY:  Past Surgical History:  Procedure Laterality Date  . COLON SURGERY    . COLONOSCOPY WITH PROPOFOL N/A 05/30/2015   Procedure: COLONOSCOPY WITH PROPOFOL;  Surgeon: Lucilla Lame, MD;  Location: [place];  Service: Endoscopy;  Laterality: N/A;  . COLONOSCOPY WITH PROPOFOL N/A 08/28/2016   Procedure: COLONOSCOPY WITH PROPOFOL;  Surgeon: Lucilla Lame, MD;  Location: [facility];  Service: Endoscopy;  Laterality: N/A;  . CYSTOSCOPY W/ RETROGRADES Bilateral 07/11/2015   Procedure: CYSTOSCOPY WITH RETROGRADE PYELOGRAM;  Surgeon: Hollice Espy, MD;  Location: [facility];  Service: Urology;  Laterality:  Bilateral;  . CYSTOSCOPY WITH STENT PLACEMENT Bilateral 07/11/2015   Procedure: CYSTOSCOPY WITH STENT PLACEMENT;  Surgeon: Hollice Espy, MD;  Location: [facility];  Service: Urology;  Laterality: Bilateral;  . LAPAROSCOPIC PARTIAL COLECTOMY N/A 07/11/2015   Procedure: Laparoscopic right hemicolectomy;  Surgeon: Hubbard Robinson, MD;  Location: [facility];  Service: General;  Laterality: N/A;  . POLYPECTOMY  05/30/2015   Procedure: POLYPECTOMY;  Surgeon: Lucilla Lame, MD;  Location: [place];  Service: Endoscopy;;  . VIDEO ASSISTED THORACOSCOPY (VATS) W/TALC PLEUADESIS Left 05/15/2018   Procedure: VIDEO ASSISTED THORACOSCOPY (VATS) Drucie Ip PLEUADESIS;  Surgeon: Nestor Lewandowsky, MD;  Location: [facility];  Service: Thoracic;  Laterality: Left;  Marland Kitchen VOLUME STUDY N/A 07/22/2018   Procedure: VOLUME STUDY;  Surgeon: Abbie Sons, MD;  Location: [facility];  Service: Urology;  Laterality: N/A;    FAMILY HISTORY: family history includes Heart disease in his mother; Stroke in his father.  SOCIAL HISTORY:  reports that he quit smoking about 20 months ago. His smoking use included cigarettes. He has a 30.00 pack-year smoking history. He has never used smokeless tobacco. He reports previous alcohol use. He reports that he does not use drugs.  ALLERGIES: Patient has no known allergies.  MEDICATIONS:  Current Outpatient Medications  Medication Sig Dispense Refill  . albuterol (PROVENTIL HFA;VENTOLIN HFA) 108 (90 Base) MCG/ACT inhaler Inhale 2 puffs into the lungs every 4 (four) hours as needed for wheezing or shortness of breath.    Marland Kitchen atenolol (TENORMIN) 50 MG tablet Take 50 mg by mouth every morning.     . clotrimazole-betamethasone (LOTRISONE) cream Apply 1 application topically 2 (two) times daily as needed (  for irritation).    Marland Kitchen dextromethorphan-guaiFENesin (MUCINEX DM) 30-600 MG 12hr tablet Take 1 tablet by mouth 2 (two) times daily.    . fluticasone furoate-vilanterol (BREO ELLIPTA) 200-25 MCG/INH  AEPB Inhale 1 puff into the lungs daily as needed (for shortness of breath or wheezing).    Marland Kitchen latanoprost (XALATAN) 0.005 % ophthalmic solution Place 1 drop into both eyes at bedtime.    Marland Kitchen NIFEdipine (PROCARDIA-XL/ADALAT CC) 30 MG 24 hr tablet Take 30 mg by mouth every morning.      No current facility-administered medications for this encounter.    Facility-Administered Medications Ordered in Other Encounters  Medication Dose Route Frequency Provider Last Rate Last Dose  . fentaNYL (SUBLIMAZE) injection 25 mcg  25 mcg Intravenous Q5 min PRN Alvin Critchley, MD      . ondansetron [facility]) injection 4 mg  4 mg Intravenous Once PRN Alvin Critchley, MD        ECOG PERFORMANCE STATUS:  0 - Asymptomatic  REVIEW OF SYSTEMS:  Patient denies any weight loss, fatigue, weakness, fever, chills or night sweats. Patient denies any loss of vision, blurred vision. Patient denies any ringing  of the ears or hearing loss. No irregular heartbeat. Patient denies heart murmur or history of fainting. Patient denies any chest pain or pain radiating to her upper extremities. Patient denies any shortness of breath, difficulty breathing at night, cough or hemoptysis. Patient denies any swelling in the lower legs. Patient denies any nausea vomiting, vomiting of blood, or coffee ground material in the vomitus. Patient denies any stomach pain. Patient states has had normal bowel movements no significant constipation or diarrhea. Patient denies any dysuria, hematuria or significant nocturia. Patient denies any problems walking, swelling in the joints or loss of balance. Patient denies any skin changes, loss of hair or loss of weight. Patient denies any excessive worrying or anxiety or significant depression. Patient denies any problems with insomnia. Patient denies excessive thirst, polyuria, polydipsia. Patient denies any swollen glands, patient denies easy bruising or easy bleeding. Patient denies any recent infections, allergies  or URI. Patient "s visual fields have not changed significantly in recent time.    PHYSICAL EXAM: BP 136/87 (BP Location: Left Arm, Patient Position: Sitting)   Pulse 77   Resp 18   Wt 197 lb 10.3 oz (89.7 kg)   BMI 23.44 kg/m  Well-developed well-nourished patient in NAD. HEENT reveals PERLA, EOMI, discs not visualized.  Oral cavity is clear. No oral mucosal lesions are identified. Neck is clear without evidence of cervical or supraclavicular adenopathy. Lungs are clear to A&P. Cardiac examination is essentially unremarkable with regular rate and rhythm without murmur rub or thrill. Abdomen is benign with no organomegaly or masses noted. Motor sensory and DTR levels are equal and symmetric in the upper and lower extremities. Cranial nerves II through XII are grossly intact. Proprioception is intact. No peripheral adenopathy or edema is identified. No motor or sensory levels are noted. Crude visual fields are within normal range.  LABORATORY DATA: pathology reports reviewed    RADIOLOGY RESULTS:films for review   IMPRESSION: stage I adenocarcinoma the prostate for I-125 interstitial implant  PLAN: present time patient is clear to proceed with I-125 interstitial implant. He is oriented had his line study in anticipation of treatment planning for source placement. Risks and benefits of treatment including radiation safety precautions all were reviewed with the patient and his wife. They both seem to comprehend her treatment plan well.  I would like to take this  opportunity to thank you for allowing me to participate in the care of your patient.Noreene Filbert, MD

## 2018-07-24 NOTE — Op Note (Signed)
Preoperative diagnosis:  1. Prostate cancer  Postoperative diagnosis:  1. Prostate cancer  Procedure: 1. Transrectal prostate ultrasound for brachytherapy planning  Surgeon: Abbie Sons, MD Radiation oncologist: Dr. Baruch Gouty  Anesthesia: General  Complications: None  Intraoperative findings: N/A  EBL: None  Specimens: None  Indication: Stephen Madden is a 68 y.o. patient with low risk prostate cancer who has elected brachytherapy.  After reviewing the management options for treatment, he elected to proceed with the above surgical procedure(s). We have discussed the potential benefits and risks of the procedure, side effects of the proposed treatment, the likelihood of the patient achieving the goals of the procedure, and any potential problems that might occur during the procedure or recuperation. Informed consent has been obtained.  Description of procedure:  The patient was taken to the operating room and general anesthesia was induced.  The patient was placed in the dorsal lithotomy position. A preoperative time-out was performed.   A Foley catheter was placed. Trans-rectal ultrasound probe was inserted into the rectum and prostate seminal vesicles were visualized as well as bladder base. stepping images were performed on a 5 mm increments. Images will be placed in BrachyVision treatment planning system to determine seed placement coordinates for eventual I-125 interstitial implant. Images will be reviewed with the physics and dosimetry staff for final quality approval.  At the end of the procedure Foley catheter was removed, rectal ultrasound probe was removed. Patient tolerated his procedures extremely well with no side effects or complaints.    Abbie Sons, M.D.

## 2018-07-26 DIAGNOSIS — J9383 Other pneumothorax: Secondary | ICD-10-CM | POA: Diagnosis not present

## 2018-07-26 DIAGNOSIS — J449 Chronic obstructive pulmonary disease, unspecified: Secondary | ICD-10-CM | POA: Diagnosis not present

## 2018-07-26 DIAGNOSIS — J939 Pneumothorax, unspecified: Secondary | ICD-10-CM | POA: Diagnosis not present

## 2018-07-28 ENCOUNTER — Telehealth: Payer: Self-pay | Admitting: Internal Medicine

## 2018-07-28 ENCOUNTER — Telehealth: Payer: Self-pay | Admitting: *Deleted

## 2018-07-28 NOTE — Telephone Encounter (Signed)
Notified patient. Nothing further needed.

## 2018-07-28 NOTE — Telephone Encounter (Signed)
Patient may use the back pack leaf blower. Advised him to take his time and if he feels weak or tried to stop.

## 2018-07-28 NOTE — Telephone Encounter (Signed)
I think he should be ok.

## 2018-07-28 NOTE — Telephone Encounter (Signed)
Patient called and stated that he has surgery, thoracoscopy w/ Pleuadesis on 05/15/18 and wanted to know if he can use a backpack leaf blower. Please call and advise

## 2018-07-28 NOTE — Telephone Encounter (Signed)
Pt is concerned as to if using a back pack  Leaf blower will interfere with his lungs. Pt has thoracoscopy 3 months ago. Please advise?

## 2018-07-29 DIAGNOSIS — C61 Malignant neoplasm of prostate: Secondary | ICD-10-CM | POA: Diagnosis not present

## 2018-08-11 ENCOUNTER — Ambulatory Visit: Payer: PPO | Admitting: Radiation Oncology

## 2018-08-11 ENCOUNTER — Ambulatory Visit: Payer: PPO

## 2018-08-12 ENCOUNTER — Ambulatory Visit: Payer: PPO

## 2018-08-12 ENCOUNTER — Other Ambulatory Visit: Payer: Self-pay

## 2018-08-13 ENCOUNTER — Other Ambulatory Visit: Payer: Self-pay

## 2018-08-13 ENCOUNTER — Ambulatory Visit
Admission: RE | Admit: 2018-08-13 | Discharge: 2018-08-13 | Disposition: A | Discharge status: Home / Self Care | Payer: PPO | Source: Ambulatory Visit | Attending: Radiation Oncology | Admitting: Radiation Oncology

## 2018-08-13 ENCOUNTER — Ambulatory Visit: Payer: PPO

## 2018-08-13 DIAGNOSIS — C61 Malignant neoplasm of prostate: Secondary | ICD-10-CM | POA: Diagnosis not present

## 2018-08-13 DIAGNOSIS — Z510 Encounter for antineoplastic radiation therapy: Secondary | ICD-10-CM | POA: Diagnosis not present

## 2018-08-14 DIAGNOSIS — Z510 Encounter for antineoplastic radiation therapy: Secondary | ICD-10-CM | POA: Diagnosis not present

## 2018-08-14 DIAGNOSIS — C61 Malignant neoplasm of prostate: Secondary | ICD-10-CM | POA: Diagnosis not present

## 2018-08-20 DIAGNOSIS — R972 Elevated prostate specific antigen [PSA]: Secondary | ICD-10-CM

## 2018-08-20 HISTORY — DX: Elevated prostate specific antigen (PSA): R97.20

## 2018-08-21 ENCOUNTER — Other Ambulatory Visit: Payer: Self-pay | Admitting: *Deleted

## 2018-08-21 DIAGNOSIS — C61 Malignant neoplasm of prostate: Secondary | ICD-10-CM

## 2018-08-25 ENCOUNTER — Other Ambulatory Visit: Payer: Self-pay

## 2018-08-25 ENCOUNTER — Ambulatory Visit
Admission: RE | Admit: 2018-08-25 | Discharge: 2018-08-25 | Disposition: A | Discharge status: Home / Self Care | Payer: PPO | Source: Ambulatory Visit | Attending: Radiation Oncology | Admitting: Radiation Oncology

## 2018-08-25 DIAGNOSIS — C61 Malignant neoplasm of prostate: Secondary | ICD-10-CM | POA: Insufficient documentation

## 2018-08-25 DIAGNOSIS — Z510 Encounter for antineoplastic radiation therapy: Secondary | ICD-10-CM | POA: Insufficient documentation

## 2018-08-26 ENCOUNTER — Other Ambulatory Visit: Payer: Self-pay

## 2018-08-26 ENCOUNTER — Ambulatory Visit: Admit: 2018-08-26 | Payer: PPO | Admitting: Urology

## 2018-08-26 ENCOUNTER — Ambulatory Visit
Admission: RE | Admit: 2018-08-26 | Discharge: 2018-08-26 | Disposition: A | Discharge status: Home / Self Care | Payer: PPO | Source: Ambulatory Visit | Attending: Radiation Oncology | Admitting: Radiation Oncology

## 2018-08-26 DIAGNOSIS — Z510 Encounter for antineoplastic radiation therapy: Secondary | ICD-10-CM | POA: Diagnosis not present

## 2018-08-26 DIAGNOSIS — J9383 Other pneumothorax: Secondary | ICD-10-CM | POA: Diagnosis not present

## 2018-08-26 DIAGNOSIS — C61 Malignant neoplasm of prostate: Secondary | ICD-10-CM | POA: Diagnosis not present

## 2018-08-26 DIAGNOSIS — J449 Chronic obstructive pulmonary disease, unspecified: Secondary | ICD-10-CM | POA: Diagnosis not present

## 2018-08-26 DIAGNOSIS — J939 Pneumothorax, unspecified: Secondary | ICD-10-CM | POA: Diagnosis not present

## 2018-08-26 SURGERY — INSERTION, RADIATION SOURCE, PROSTATE
Anesthesia: Choice

## 2018-08-27 ENCOUNTER — Ambulatory Visit
Admission: RE | Admit: 2018-08-27 | Discharge: 2018-08-27 | Disposition: A | Discharge status: Home / Self Care | Payer: PPO | Source: Ambulatory Visit | Attending: Radiation Oncology | Admitting: Radiation Oncology

## 2018-08-27 ENCOUNTER — Other Ambulatory Visit: Payer: Self-pay

## 2018-08-27 DIAGNOSIS — Z510 Encounter for antineoplastic radiation therapy: Secondary | ICD-10-CM | POA: Diagnosis not present

## 2018-08-28 ENCOUNTER — Other Ambulatory Visit: Payer: Self-pay

## 2018-08-28 ENCOUNTER — Ambulatory Visit
Admission: RE | Admit: 2018-08-28 | Discharge: 2018-08-28 | Disposition: A | Discharge status: Home / Self Care | Payer: PPO | Source: Ambulatory Visit | Attending: Radiation Oncology | Admitting: Radiation Oncology

## 2018-08-28 DIAGNOSIS — C61 Malignant neoplasm of prostate: Secondary | ICD-10-CM | POA: Diagnosis not present

## 2018-08-28 DIAGNOSIS — Z510 Encounter for antineoplastic radiation therapy: Secondary | ICD-10-CM | POA: Diagnosis not present

## 2018-08-29 ENCOUNTER — Other Ambulatory Visit: Payer: Self-pay

## 2018-08-29 ENCOUNTER — Ambulatory Visit
Admission: RE | Admit: 2018-08-29 | Discharge: 2018-08-29 | Disposition: A | Discharge status: Home / Self Care | Payer: PPO | Source: Ambulatory Visit | Attending: Radiation Oncology | Admitting: Radiation Oncology

## 2018-08-29 DIAGNOSIS — C61 Malignant neoplasm of prostate: Secondary | ICD-10-CM | POA: Diagnosis not present

## 2018-08-29 DIAGNOSIS — Z510 Encounter for antineoplastic radiation therapy: Secondary | ICD-10-CM | POA: Diagnosis not present

## 2018-09-01 ENCOUNTER — Other Ambulatory Visit: Payer: Self-pay

## 2018-09-01 ENCOUNTER — Ambulatory Visit
Admission: RE | Admit: 2018-09-01 | Discharge: 2018-09-01 | Disposition: A | Discharge status: Home / Self Care | Payer: PPO | Source: Ambulatory Visit | Attending: Radiation Oncology | Admitting: Radiation Oncology

## 2018-09-01 DIAGNOSIS — Z510 Encounter for antineoplastic radiation therapy: Secondary | ICD-10-CM | POA: Diagnosis not present

## 2018-09-01 DIAGNOSIS — C61 Malignant neoplasm of prostate: Secondary | ICD-10-CM | POA: Diagnosis not present

## 2018-09-02 ENCOUNTER — Other Ambulatory Visit: Payer: Self-pay

## 2018-09-02 ENCOUNTER — Ambulatory Visit
Admission: RE | Admit: 2018-09-02 | Discharge: 2018-09-02 | Disposition: A | Discharge status: Home / Self Care | Payer: PPO | Source: Ambulatory Visit | Attending: Radiation Oncology | Admitting: Radiation Oncology

## 2018-09-02 DIAGNOSIS — C61 Malignant neoplasm of prostate: Secondary | ICD-10-CM | POA: Diagnosis not present

## 2018-09-02 DIAGNOSIS — Z510 Encounter for antineoplastic radiation therapy: Secondary | ICD-10-CM | POA: Diagnosis not present

## 2018-09-03 ENCOUNTER — Ambulatory Visit
Admission: RE | Admit: 2018-09-03 | Discharge: 2018-09-03 | Disposition: A | Discharge status: Home / Self Care | Payer: PPO | Source: Ambulatory Visit | Attending: Radiation Oncology | Admitting: Radiation Oncology

## 2018-09-03 DIAGNOSIS — Z510 Encounter for antineoplastic radiation therapy: Secondary | ICD-10-CM | POA: Diagnosis not present

## 2018-09-03 DIAGNOSIS — C61 Malignant neoplasm of prostate: Secondary | ICD-10-CM | POA: Diagnosis not present

## 2018-09-04 ENCOUNTER — Other Ambulatory Visit: Payer: Self-pay

## 2018-09-04 ENCOUNTER — Ambulatory Visit
Admission: RE | Admit: 2018-09-04 | Discharge: 2018-09-04 | Disposition: A | Discharge status: Home / Self Care | Payer: PPO | Source: Ambulatory Visit | Attending: Radiation Oncology | Admitting: Radiation Oncology

## 2018-09-04 DIAGNOSIS — C61 Malignant neoplasm of prostate: Secondary | ICD-10-CM | POA: Diagnosis not present

## 2018-09-04 DIAGNOSIS — Z510 Encounter for antineoplastic radiation therapy: Secondary | ICD-10-CM | POA: Diagnosis not present

## 2018-09-05 ENCOUNTER — Ambulatory Visit
Admission: RE | Admit: 2018-09-05 | Discharge: 2018-09-05 | Disposition: A | Discharge status: Home / Self Care | Payer: PPO | Source: Ambulatory Visit | Attending: Radiation Oncology | Admitting: Radiation Oncology

## 2018-09-05 ENCOUNTER — Other Ambulatory Visit: Payer: Self-pay

## 2018-09-05 DIAGNOSIS — C61 Malignant neoplasm of prostate: Secondary | ICD-10-CM | POA: Diagnosis not present

## 2018-09-05 DIAGNOSIS — Z510 Encounter for antineoplastic radiation therapy: Secondary | ICD-10-CM | POA: Diagnosis not present

## 2018-09-08 ENCOUNTER — Ambulatory Visit
Admission: RE | Admit: 2018-09-08 | Discharge: 2018-09-08 | Disposition: A | Discharge status: Home / Self Care | Payer: PPO | Source: Ambulatory Visit | Attending: Radiation Oncology | Admitting: Radiation Oncology

## 2018-09-08 DIAGNOSIS — C61 Malignant neoplasm of prostate: Secondary | ICD-10-CM | POA: Diagnosis not present

## 2018-09-08 DIAGNOSIS — Z510 Encounter for antineoplastic radiation therapy: Secondary | ICD-10-CM | POA: Diagnosis not present

## 2018-09-09 ENCOUNTER — Inpatient Hospital Stay: Payer: PPO | Attending: Radiation Oncology

## 2018-09-09 ENCOUNTER — Ambulatory Visit
Admission: RE | Admit: 2018-09-09 | Discharge: 2018-09-09 | Disposition: A | Discharge status: Home / Self Care | Payer: PPO | Source: Ambulatory Visit | Attending: Radiation Oncology | Admitting: Radiation Oncology

## 2018-09-09 ENCOUNTER — Other Ambulatory Visit: Payer: Self-pay

## 2018-09-09 DIAGNOSIS — C61 Malignant neoplasm of prostate: Secondary | ICD-10-CM | POA: Insufficient documentation

## 2018-09-09 DIAGNOSIS — Z510 Encounter for antineoplastic radiation therapy: Secondary | ICD-10-CM | POA: Diagnosis not present

## 2018-09-09 LAB — CBC
HCT: 39.8 % (ref 39.0–52.0)
Hemoglobin: 13 g/dL (ref 13.0–17.0)
MCH: 30.1 pg (ref 26.0–34.0)
MCHC: 32.7 g/dL (ref 30.0–36.0)
MCV: 92.1 fL (ref 80.0–100.0)
Platelets: 195 10*3/uL (ref 150–400)
RBC: 4.32 MIL/uL (ref 4.22–5.81)
RDW: 13.6 % (ref 11.5–15.5)
WBC: 5.3 10*3/uL (ref 4.0–10.5)
nRBC: 0 % (ref 0.0–0.2)

## 2018-09-10 ENCOUNTER — Ambulatory Visit
Admission: RE | Admit: 2018-09-10 | Discharge: 2018-09-10 | Disposition: A | Discharge status: Home / Self Care | Payer: PPO | Source: Ambulatory Visit | Attending: Radiation Oncology | Admitting: Radiation Oncology

## 2018-09-10 DIAGNOSIS — Z510 Encounter for antineoplastic radiation therapy: Secondary | ICD-10-CM | POA: Diagnosis not present

## 2018-09-10 DIAGNOSIS — C61 Malignant neoplasm of prostate: Secondary | ICD-10-CM | POA: Diagnosis not present

## 2018-09-11 ENCOUNTER — Other Ambulatory Visit: Payer: Self-pay

## 2018-09-11 ENCOUNTER — Ambulatory Visit
Admission: RE | Admit: 2018-09-11 | Discharge: 2018-09-11 | Disposition: A | Discharge status: Home / Self Care | Payer: PPO | Source: Ambulatory Visit | Attending: Radiation Oncology | Admitting: Radiation Oncology

## 2018-09-11 DIAGNOSIS — Z510 Encounter for antineoplastic radiation therapy: Secondary | ICD-10-CM | POA: Diagnosis not present

## 2018-09-11 DIAGNOSIS — C61 Malignant neoplasm of prostate: Secondary | ICD-10-CM | POA: Diagnosis not present

## 2018-09-12 ENCOUNTER — Ambulatory Visit
Admission: RE | Admit: 2018-09-12 | Discharge: 2018-09-12 | Disposition: A | Discharge status: Home / Self Care | Payer: PPO | Source: Ambulatory Visit | Attending: Radiation Oncology | Admitting: Radiation Oncology

## 2018-09-12 ENCOUNTER — Other Ambulatory Visit: Payer: Self-pay

## 2018-09-12 DIAGNOSIS — Z510 Encounter for antineoplastic radiation therapy: Secondary | ICD-10-CM | POA: Diagnosis not present

## 2018-09-12 DIAGNOSIS — C61 Malignant neoplasm of prostate: Secondary | ICD-10-CM | POA: Diagnosis not present

## 2018-09-15 ENCOUNTER — Ambulatory Visit
Admission: RE | Admit: 2018-09-15 | Discharge: 2018-09-15 | Disposition: A | Discharge status: Home / Self Care | Payer: PPO | Source: Ambulatory Visit | Attending: Radiation Oncology | Admitting: Radiation Oncology

## 2018-09-15 ENCOUNTER — Other Ambulatory Visit: Payer: Self-pay

## 2018-09-15 DIAGNOSIS — Z510 Encounter for antineoplastic radiation therapy: Secondary | ICD-10-CM | POA: Diagnosis not present

## 2018-09-15 DIAGNOSIS — C61 Malignant neoplasm of prostate: Secondary | ICD-10-CM | POA: Diagnosis not present

## 2018-09-16 ENCOUNTER — Ambulatory Visit
Admission: RE | Admit: 2018-09-16 | Discharge: 2018-09-16 | Disposition: A | Discharge status: Home / Self Care | Payer: PPO | Source: Ambulatory Visit | Attending: Radiation Oncology | Admitting: Radiation Oncology

## 2018-09-16 ENCOUNTER — Other Ambulatory Visit: Payer: Self-pay

## 2018-09-16 DIAGNOSIS — C61 Malignant neoplasm of prostate: Secondary | ICD-10-CM | POA: Diagnosis not present

## 2018-09-16 DIAGNOSIS — Z510 Encounter for antineoplastic radiation therapy: Secondary | ICD-10-CM | POA: Diagnosis not present

## 2018-09-17 ENCOUNTER — Ambulatory Visit
Admission: RE | Admit: 2018-09-17 | Discharge: 2018-09-17 | Disposition: A | Discharge status: Home / Self Care | Payer: PPO | Source: Ambulatory Visit | Attending: Radiation Oncology | Admitting: Radiation Oncology

## 2018-09-17 ENCOUNTER — Other Ambulatory Visit: Payer: Self-pay

## 2018-09-17 DIAGNOSIS — C61 Malignant neoplasm of prostate: Secondary | ICD-10-CM | POA: Diagnosis not present

## 2018-09-17 DIAGNOSIS — Z510 Encounter for antineoplastic radiation therapy: Secondary | ICD-10-CM | POA: Diagnosis not present

## 2018-09-18 ENCOUNTER — Other Ambulatory Visit: Payer: Self-pay

## 2018-09-18 ENCOUNTER — Ambulatory Visit
Admission: RE | Admit: 2018-09-18 | Discharge: 2018-09-18 | Disposition: A | Discharge status: Home / Self Care | Payer: PPO | Source: Ambulatory Visit | Attending: Radiation Oncology | Admitting: Radiation Oncology

## 2018-09-18 DIAGNOSIS — Z510 Encounter for antineoplastic radiation therapy: Secondary | ICD-10-CM | POA: Diagnosis not present

## 2018-09-18 DIAGNOSIS — C61 Malignant neoplasm of prostate: Secondary | ICD-10-CM | POA: Diagnosis not present

## 2018-09-19 ENCOUNTER — Ambulatory Visit
Admission: RE | Admit: 2018-09-19 | Discharge: 2018-09-19 | Disposition: A | Discharge status: Home / Self Care | Payer: PPO | Source: Ambulatory Visit | Attending: Radiation Oncology | Admitting: Radiation Oncology

## 2018-09-19 DIAGNOSIS — Z510 Encounter for antineoplastic radiation therapy: Secondary | ICD-10-CM | POA: Insufficient documentation

## 2018-09-19 DIAGNOSIS — C61 Malignant neoplasm of prostate: Secondary | ICD-10-CM | POA: Insufficient documentation

## 2018-09-22 ENCOUNTER — Ambulatory Visit
Admission: RE | Admit: 2018-09-22 | Discharge: 2018-09-22 | Disposition: A | Discharge status: Home / Self Care | Payer: PPO | Source: Ambulatory Visit | Attending: Radiation Oncology | Admitting: Radiation Oncology

## 2018-09-22 DIAGNOSIS — Z510 Encounter for antineoplastic radiation therapy: Secondary | ICD-10-CM | POA: Diagnosis not present

## 2018-09-22 DIAGNOSIS — C61 Malignant neoplasm of prostate: Secondary | ICD-10-CM | POA: Diagnosis not present

## 2018-09-23 ENCOUNTER — Inpatient Hospital Stay: Payer: PPO | Attending: Radiation Oncology

## 2018-09-23 ENCOUNTER — Ambulatory Visit
Admission: RE | Admit: 2018-09-23 | Discharge: 2018-09-23 | Disposition: A | Discharge status: Home / Self Care | Payer: PPO | Source: Ambulatory Visit | Attending: Radiation Oncology | Admitting: Radiation Oncology

## 2018-09-23 ENCOUNTER — Other Ambulatory Visit: Payer: Self-pay

## 2018-09-23 ENCOUNTER — Ambulatory Visit: Payer: PPO | Admitting: Radiation Oncology

## 2018-09-23 ENCOUNTER — Ambulatory Visit: Payer: PPO

## 2018-09-23 DIAGNOSIS — Z510 Encounter for antineoplastic radiation therapy: Secondary | ICD-10-CM | POA: Diagnosis not present

## 2018-09-23 DIAGNOSIS — C61 Malignant neoplasm of prostate: Secondary | ICD-10-CM | POA: Insufficient documentation

## 2018-09-24 ENCOUNTER — Ambulatory Visit
Admission: RE | Admit: 2018-09-24 | Discharge: 2018-09-24 | Disposition: A | Discharge status: Home / Self Care | Payer: PPO | Source: Ambulatory Visit | Attending: Radiation Oncology | Admitting: Radiation Oncology

## 2018-09-24 ENCOUNTER — Other Ambulatory Visit: Payer: Self-pay

## 2018-09-24 DIAGNOSIS — R05 Cough: Secondary | ICD-10-CM | POA: Diagnosis not present

## 2018-09-24 DIAGNOSIS — R0609 Other forms of dyspnea: Secondary | ICD-10-CM | POA: Diagnosis not present

## 2018-09-24 DIAGNOSIS — I10 Essential (primary) hypertension: Secondary | ICD-10-CM | POA: Diagnosis not present

## 2018-09-24 DIAGNOSIS — C61 Malignant neoplasm of prostate: Secondary | ICD-10-CM | POA: Diagnosis not present

## 2018-09-24 DIAGNOSIS — Z510 Encounter for antineoplastic radiation therapy: Secondary | ICD-10-CM | POA: Diagnosis not present

## 2018-09-24 DIAGNOSIS — R0602 Shortness of breath: Secondary | ICD-10-CM | POA: Diagnosis not present

## 2018-09-25 ENCOUNTER — Ambulatory Visit
Admission: RE | Admit: 2018-09-25 | Discharge: 2018-09-25 | Disposition: A | Discharge status: Home / Self Care | Payer: PPO | Source: Ambulatory Visit | Attending: Radiation Oncology | Admitting: Radiation Oncology

## 2018-09-25 DIAGNOSIS — J9383 Other pneumothorax: Secondary | ICD-10-CM | POA: Diagnosis not present

## 2018-09-25 DIAGNOSIS — C61 Malignant neoplasm of prostate: Secondary | ICD-10-CM | POA: Diagnosis not present

## 2018-09-25 DIAGNOSIS — J449 Chronic obstructive pulmonary disease, unspecified: Secondary | ICD-10-CM | POA: Diagnosis not present

## 2018-09-25 DIAGNOSIS — Z510 Encounter for antineoplastic radiation therapy: Secondary | ICD-10-CM | POA: Diagnosis not present

## 2018-09-25 DIAGNOSIS — J939 Pneumothorax, unspecified: Secondary | ICD-10-CM | POA: Diagnosis not present

## 2018-09-26 ENCOUNTER — Other Ambulatory Visit: Payer: Self-pay

## 2018-09-26 ENCOUNTER — Ambulatory Visit
Admission: RE | Admit: 2018-09-26 | Discharge: 2018-09-26 | Disposition: A | Discharge status: Home / Self Care | Payer: PPO | Source: Ambulatory Visit | Attending: Radiation Oncology | Admitting: Radiation Oncology

## 2018-09-26 DIAGNOSIS — C61 Malignant neoplasm of prostate: Secondary | ICD-10-CM | POA: Diagnosis not present

## 2018-09-26 DIAGNOSIS — Z510 Encounter for antineoplastic radiation therapy: Secondary | ICD-10-CM | POA: Diagnosis not present

## 2018-09-29 ENCOUNTER — Other Ambulatory Visit: Payer: Self-pay

## 2018-09-29 ENCOUNTER — Ambulatory Visit
Admission: RE | Admit: 2018-09-29 | Discharge: 2018-09-29 | Disposition: A | Discharge status: Home / Self Care | Payer: PPO | Source: Ambulatory Visit | Attending: Radiation Oncology | Admitting: Radiation Oncology

## 2018-09-29 DIAGNOSIS — Z510 Encounter for antineoplastic radiation therapy: Secondary | ICD-10-CM | POA: Diagnosis not present

## 2018-09-29 DIAGNOSIS — C61 Malignant neoplasm of prostate: Secondary | ICD-10-CM | POA: Diagnosis not present

## 2018-09-30 ENCOUNTER — Ambulatory Visit
Admission: RE | Admit: 2018-09-30 | Discharge: 2018-09-30 | Disposition: A | Discharge status: Home / Self Care | Payer: PPO | Source: Ambulatory Visit | Attending: Radiation Oncology | Admitting: Radiation Oncology

## 2018-09-30 ENCOUNTER — Other Ambulatory Visit: Payer: Self-pay

## 2018-09-30 DIAGNOSIS — Z510 Encounter for antineoplastic radiation therapy: Secondary | ICD-10-CM | POA: Diagnosis not present

## 2018-09-30 DIAGNOSIS — C61 Malignant neoplasm of prostate: Secondary | ICD-10-CM | POA: Diagnosis not present

## 2018-10-01 ENCOUNTER — Other Ambulatory Visit: Payer: Self-pay

## 2018-10-01 ENCOUNTER — Ambulatory Visit
Admission: RE | Admit: 2018-10-01 | Discharge: 2018-10-01 | Disposition: A | Discharge status: Home / Self Care | Payer: PPO | Source: Ambulatory Visit | Attending: Radiation Oncology | Admitting: Radiation Oncology

## 2018-10-01 DIAGNOSIS — C61 Malignant neoplasm of prostate: Secondary | ICD-10-CM | POA: Diagnosis not present

## 2018-10-01 DIAGNOSIS — Z510 Encounter for antineoplastic radiation therapy: Secondary | ICD-10-CM | POA: Diagnosis not present

## 2018-10-02 ENCOUNTER — Other Ambulatory Visit: Payer: Self-pay

## 2018-10-02 ENCOUNTER — Ambulatory Visit
Admission: RE | Admit: 2018-10-02 | Discharge: 2018-10-02 | Disposition: A | Discharge status: Home / Self Care | Payer: PPO | Source: Ambulatory Visit | Attending: Radiation Oncology | Admitting: Radiation Oncology

## 2018-10-02 DIAGNOSIS — C61 Malignant neoplasm of prostate: Secondary | ICD-10-CM | POA: Diagnosis not present

## 2018-10-02 DIAGNOSIS — Z510 Encounter for antineoplastic radiation therapy: Secondary | ICD-10-CM | POA: Diagnosis not present

## 2018-10-03 ENCOUNTER — Ambulatory Visit
Admission: RE | Admit: 2018-10-03 | Discharge: 2018-10-03 | Disposition: A | Discharge status: Home / Self Care | Payer: PPO | Source: Ambulatory Visit | Attending: Radiation Oncology | Admitting: Radiation Oncology

## 2018-10-03 ENCOUNTER — Other Ambulatory Visit: Payer: Self-pay

## 2018-10-03 DIAGNOSIS — Z510 Encounter for antineoplastic radiation therapy: Secondary | ICD-10-CM | POA: Diagnosis not present

## 2018-10-06 ENCOUNTER — Ambulatory Visit
Admission: RE | Admit: 2018-10-06 | Discharge: 2018-10-06 | Disposition: A | Discharge status: Home / Self Care | Payer: PPO | Source: Ambulatory Visit | Attending: Radiation Oncology | Admitting: Radiation Oncology

## 2018-10-06 DIAGNOSIS — C61 Malignant neoplasm of prostate: Secondary | ICD-10-CM | POA: Diagnosis not present

## 2018-10-06 DIAGNOSIS — Z510 Encounter for antineoplastic radiation therapy: Secondary | ICD-10-CM | POA: Diagnosis not present

## 2018-10-07 ENCOUNTER — Other Ambulatory Visit: Payer: Self-pay

## 2018-10-07 ENCOUNTER — Inpatient Hospital Stay: Payer: PPO

## 2018-10-07 ENCOUNTER — Ambulatory Visit
Admission: RE | Admit: 2018-10-07 | Discharge: 2018-10-07 | Disposition: A | Discharge status: Home / Self Care | Payer: PPO | Source: Ambulatory Visit | Attending: Radiation Oncology | Admitting: Radiation Oncology

## 2018-10-07 DIAGNOSIS — Z510 Encounter for antineoplastic radiation therapy: Secondary | ICD-10-CM | POA: Diagnosis not present

## 2018-10-07 DIAGNOSIS — C61 Malignant neoplasm of prostate: Secondary | ICD-10-CM | POA: Diagnosis not present

## 2018-10-07 LAB — CBC
HCT: 38.8 % — ABNORMAL LOW (ref 39.0–52.0)
Hemoglobin: 12.8 g/dL — ABNORMAL LOW (ref 13.0–17.0)
MCH: 30.2 pg (ref 26.0–34.0)
MCHC: 33 g/dL (ref 30.0–36.0)
MCV: 91.5 fL (ref 80.0–100.0)
Platelets: 191 10*3/uL (ref 150–400)
RBC: 4.24 MIL/uL (ref 4.22–5.81)
RDW: 13.7 % (ref 11.5–15.5)
WBC: 5.1 10*3/uL (ref 4.0–10.5)
nRBC: 0 % (ref 0.0–0.2)

## 2018-10-08 ENCOUNTER — Other Ambulatory Visit: Payer: Self-pay

## 2018-10-08 ENCOUNTER — Ambulatory Visit
Admission: RE | Admit: 2018-10-08 | Discharge: 2018-10-08 | Disposition: A | Discharge status: Home / Self Care | Payer: PPO | Source: Ambulatory Visit | Attending: Radiation Oncology | Admitting: Radiation Oncology

## 2018-10-08 DIAGNOSIS — C61 Malignant neoplasm of prostate: Secondary | ICD-10-CM | POA: Diagnosis not present

## 2018-10-08 DIAGNOSIS — Z510 Encounter for antineoplastic radiation therapy: Secondary | ICD-10-CM | POA: Diagnosis not present

## 2018-10-09 ENCOUNTER — Ambulatory Visit
Admission: RE | Admit: 2018-10-09 | Discharge: 2018-10-09 | Disposition: A | Discharge status: Home / Self Care | Payer: PPO | Source: Ambulatory Visit | Attending: Radiation Oncology | Admitting: Radiation Oncology

## 2018-10-09 ENCOUNTER — Other Ambulatory Visit: Payer: Self-pay

## 2018-10-09 DIAGNOSIS — Z510 Encounter for antineoplastic radiation therapy: Secondary | ICD-10-CM | POA: Diagnosis not present

## 2018-10-09 DIAGNOSIS — C61 Malignant neoplasm of prostate: Secondary | ICD-10-CM | POA: Diagnosis not present

## 2018-10-10 ENCOUNTER — Ambulatory Visit
Admission: RE | Admit: 2018-10-10 | Discharge: 2018-10-10 | Disposition: A | Discharge status: Home / Self Care | Payer: PPO | Source: Ambulatory Visit | Attending: Radiation Oncology | Admitting: Radiation Oncology

## 2018-10-10 DIAGNOSIS — Z510 Encounter for antineoplastic radiation therapy: Secondary | ICD-10-CM | POA: Diagnosis not present

## 2018-10-10 DIAGNOSIS — C61 Malignant neoplasm of prostate: Secondary | ICD-10-CM | POA: Diagnosis not present

## 2018-10-14 ENCOUNTER — Other Ambulatory Visit: Payer: Self-pay

## 2018-10-14 ENCOUNTER — Ambulatory Visit
Admission: RE | Admit: 2018-10-14 | Discharge: 2018-10-14 | Disposition: A | Discharge status: Home / Self Care | Payer: PPO | Source: Ambulatory Visit | Attending: Radiation Oncology | Admitting: Radiation Oncology

## 2018-10-14 DIAGNOSIS — C61 Malignant neoplasm of prostate: Secondary | ICD-10-CM | POA: Diagnosis not present

## 2018-10-14 DIAGNOSIS — Z510 Encounter for antineoplastic radiation therapy: Secondary | ICD-10-CM | POA: Diagnosis not present

## 2018-10-15 ENCOUNTER — Other Ambulatory Visit: Payer: Self-pay

## 2018-10-15 ENCOUNTER — Ambulatory Visit
Admission: RE | Admit: 2018-10-15 | Discharge: 2018-10-15 | Disposition: A | Discharge status: Home / Self Care | Payer: PPO | Source: Ambulatory Visit | Attending: Radiation Oncology | Admitting: Radiation Oncology

## 2018-10-15 DIAGNOSIS — Z510 Encounter for antineoplastic radiation therapy: Secondary | ICD-10-CM | POA: Diagnosis not present

## 2018-10-15 DIAGNOSIS — C61 Malignant neoplasm of prostate: Secondary | ICD-10-CM | POA: Diagnosis not present

## 2018-10-16 ENCOUNTER — Ambulatory Visit
Admission: RE | Admit: 2018-10-16 | Discharge: 2018-10-16 | Disposition: A | Discharge status: Home / Self Care | Payer: PPO | Source: Ambulatory Visit | Attending: Radiation Oncology | Admitting: Radiation Oncology

## 2018-10-16 ENCOUNTER — Other Ambulatory Visit: Payer: Self-pay

## 2018-10-16 DIAGNOSIS — C61 Malignant neoplasm of prostate: Secondary | ICD-10-CM | POA: Diagnosis not present

## 2018-10-16 DIAGNOSIS — Z510 Encounter for antineoplastic radiation therapy: Secondary | ICD-10-CM | POA: Diagnosis not present

## 2018-10-17 ENCOUNTER — Ambulatory Visit
Admission: RE | Admit: 2018-10-17 | Discharge: 2018-10-17 | Disposition: A | Discharge status: Home / Self Care | Payer: PPO | Source: Ambulatory Visit | Attending: Radiation Oncology | Admitting: Radiation Oncology

## 2018-10-17 DIAGNOSIS — C61 Malignant neoplasm of prostate: Secondary | ICD-10-CM | POA: Diagnosis not present

## 2018-10-17 DIAGNOSIS — Z510 Encounter for antineoplastic radiation therapy: Secondary | ICD-10-CM | POA: Diagnosis not present

## 2018-10-20 ENCOUNTER — Other Ambulatory Visit: Payer: Self-pay

## 2018-10-20 ENCOUNTER — Ambulatory Visit
Admission: RE | Admit: 2018-10-20 | Discharge: 2018-10-20 | Disposition: A | Discharge status: Home / Self Care | Payer: PPO | Source: Ambulatory Visit | Attending: Radiation Oncology | Admitting: Radiation Oncology

## 2018-10-20 DIAGNOSIS — C61 Malignant neoplasm of prostate: Secondary | ICD-10-CM | POA: Diagnosis not present

## 2018-10-20 DIAGNOSIS — Z510 Encounter for antineoplastic radiation therapy: Secondary | ICD-10-CM | POA: Diagnosis not present

## 2018-10-21 ENCOUNTER — Other Ambulatory Visit: Payer: Self-pay

## 2018-10-21 ENCOUNTER — Ambulatory Visit
Admission: RE | Admit: 2018-10-21 | Discharge: 2018-10-21 | Disposition: A | Discharge status: Home / Self Care | Payer: PPO | Source: Ambulatory Visit | Attending: Radiation Oncology | Admitting: Radiation Oncology

## 2018-10-21 DIAGNOSIS — C61 Malignant neoplasm of prostate: Secondary | ICD-10-CM | POA: Diagnosis not present

## 2018-10-21 DIAGNOSIS — Z510 Encounter for antineoplastic radiation therapy: Secondary | ICD-10-CM | POA: Diagnosis not present

## 2018-10-26 DIAGNOSIS — J449 Chronic obstructive pulmonary disease, unspecified: Secondary | ICD-10-CM | POA: Diagnosis not present

## 2018-10-26 DIAGNOSIS — J9383 Other pneumothorax: Secondary | ICD-10-CM | POA: Diagnosis not present

## 2018-10-26 DIAGNOSIS — J939 Pneumothorax, unspecified: Secondary | ICD-10-CM | POA: Diagnosis not present

## 2018-11-05 DIAGNOSIS — H401131 Primary open-angle glaucoma, bilateral, mild stage: Secondary | ICD-10-CM | POA: Diagnosis not present

## 2018-11-20 ENCOUNTER — Other Ambulatory Visit: Payer: Self-pay

## 2018-11-24 ENCOUNTER — Encounter: Payer: Self-pay | Admitting: Radiation Oncology

## 2018-11-24 ENCOUNTER — Other Ambulatory Visit: Payer: Self-pay

## 2018-11-24 ENCOUNTER — Ambulatory Visit
Admission: RE | Admit: 2018-11-24 | Discharge: 2018-11-24 | Disposition: A | Discharge status: Home / Self Care | Payer: PPO | Source: Ambulatory Visit | Attending: Radiation Oncology | Admitting: Radiation Oncology

## 2018-11-24 ENCOUNTER — Other Ambulatory Visit: Payer: Self-pay | Admitting: *Deleted

## 2018-11-24 VITALS — BP 123/77 | HR 74 | Temp 95.1°F | Resp 18 | Wt 193.3 lb

## 2018-11-24 DIAGNOSIS — Z923 Personal history of irradiation: Secondary | ICD-10-CM | POA: Diagnosis not present

## 2018-11-24 DIAGNOSIS — C61 Malignant neoplasm of prostate: Secondary | ICD-10-CM | POA: Diagnosis not present

## 2018-11-24 NOTE — Progress Notes (Signed)
Radiation Oncology Follow up Note  Name: Stephen Madden   Date:   11/24/2018 MRN:  [id number] DOB: 27-Jan-1951    This 68 y.o. male presents to the clinic today for 1 month follow-up status post external beam radiation therapy for a stage I Gleason 6 (3+3) adenocarcinoma the prostate.  REFERRING PROVIDER: Cletis Athens, MD  HPI: Patient is a 68 year old male now at 1 month having completed external beam radiation therapy to his prostate for stage I.  (T1 cN0 M0) adenocarcinoma the prostate.  He is seen today in routine follow-up is doing well specifically denies any increased lower urinary tract symptoms diarrhea or fatigue.  COMPLICATIONS OF TREATMENT: none  FOLLOW UP COMPLIANCE: keeps appointments   PHYSICAL EXAM:  BP 123/77 (BP Location: Left Arm, Patient Position: Sitting)   Pulse 74   Temp (!) 95.1 F (35.1 C) (Tympanic)   Resp 18   Wt 193 lb 5.5 oz (87.7 kg)   BMI 22.93 kg/m  Well-developed well-nourished patient in NAD. HEENT reveals PERLA, EOMI, discs not visualized.  Oral cavity is clear. No oral mucosal lesions are identified. Neck is clear without evidence of cervical or supraclavicular adenopathy. Lungs are clear to A&P. Cardiac examination is essentially unremarkable with regular rate and rhythm without murmur rub or thrill. Abdomen is benign with no organomegaly or masses noted. Motor sensory and DTR levels are equal and symmetric in the upper and lower extremities. Cranial nerves II through XII are grossly intact. Proprioception is intact. No peripheral adenopathy or edema is identified. No motor or sensory levels are noted. Crude visual fields are within normal range.  RADIOLOGY RESULTS: No current films for review  PLAN: Present time patient is doing well with low side effect profile 1 month out from external beam radiation therapy.  I have asked to see him back in 3 months with a PSA prior to that visit.  We have also discussed him being billed for his volume study.  He  could not have interstitial implant based on the COVID-19 restrictions and I personally do not feel he should be billed for that volume study.  He will be billet bringing that billing for my review and I will pass it on to our billing department.  Patient knows to call with any concerns.  I would like to take this opportunity to thank you for allowing me to participate in the care of your patient.Noreene Filbert, MD

## 2018-11-25 DIAGNOSIS — J9383 Other pneumothorax: Secondary | ICD-10-CM | POA: Diagnosis not present

## 2018-11-25 DIAGNOSIS — J449 Chronic obstructive pulmonary disease, unspecified: Secondary | ICD-10-CM | POA: Diagnosis not present

## 2018-11-25 DIAGNOSIS — J939 Pneumothorax, unspecified: Secondary | ICD-10-CM | POA: Diagnosis not present

## 2018-12-15 ENCOUNTER — Telehealth: Payer: Self-pay | Admitting: Internal Medicine

## 2018-12-15 NOTE — Telephone Encounter (Signed)
Called patient for COVID-19 pre-screening for in office visit. ° °Have you recently traveled any where out of the local area in the last 2 weeks? No ° °Have you been in close contact with a person diagnosed with COVID-19 or someone awaiting results within the last 2 weeks? No ° °Do you currently have any of the following symptoms? If so, when did they start? °Cough     Diarrhea   Joint Pain °Fever      Muscle Pain   Red eyes °Shortness of breath   Abdominal pain  Vomiting °Loss of smell    Rash    Sore Throat °Headache    Weakness   Bruising or bleeding ° ° °Okay to proceed with visit 12/16/2018  ° ° °

## 2018-12-15 NOTE — Progress Notes (Signed)
* [facility]     Assessment and Plan:  COPD/emphysema with history of spontaneous pneumothorax. -Severe bilateral apical bullous emphysema.  Status post recurrent left spontaneous pneumothorax with pleurodesis on 05/15/2018. - Discussed with patient that the chance of a recurrent left-sided pneumothorax is very low now that he has had a pleurodesis.  However he remains at risk of a spontaneous right pneumothorax.  We discussed potential ways to reduce the risk which include avoiding activities which could cause physical trauma or avoiding changes in ambient pressure.  This includes avoiding climbing up ladders, avoiding contact sports, avoiding trips to altitude above 5000 feet for prolonged periods of time. -Avoid pulmonary function testing/spirometry. --Recommended that he take breo daily.  --Discussed pneumovax, but wants to wait due to cost.   Dyspnea on exertion. - Continue to be active.  --Use albuterol as needed.   Chronic hypoxic respiratory failure.  --Continue oxygen with activity.   History of nicotine abuse.  --Would recommend CT lung cancer screening but patient would like to wait.    Return in about 1 year (around 12/16/2019).   Date: 12/15/2018  MRN# [id number] Stephen Madden [birth year]   Stephen Madden is a 68 y.o. old male seen in follow up for chief complaint of  Chief Complaint  Patient presents with  . Follow-up    No change since last OV still SOB on exertion, not needing O2 on a regular basis but when needed is on 2L [employer] Using rescue inhaler on average 3x a week.     HPI:   Patient is a 68 year old male, he is previously been seen by the [facility] service while inpatient in December 2019.  At that time he presented to the hospital on 05/12/2018 with left-sided chest discomfort and dyspnea which reminded him of previous pneumothorax in the previous admission in October.  He was found to have a recurrent pneumothorax.  He was subsequently  taken to the OR on 05/15/2018 and underwent left thoracoscopy with apical bullectomy; talc pleurodesis.  He was subsequently referred here for further management of his COPD. Since his last visit his breathing is the same. He gets winded with moderate activity. He wears 2L with exercise.  He is using Breo 2 to 3 days per week, depending on whether how busy he is. He albuterol 2-3 times per week. He does this due to cost of the medications.   He last smoked about 2 years ago, about ppd for 40 years.   **CBC 05/18/18>> Abs eos 300.  **CT chest 10/10/2017 chest x-ray 05/30/2018>> there is severe apical emphysema seen on the CT chest, on chest x-ray there is hyperinflation suggestive of emphysema.  Not significantly changed in comparison with previous film from 09/29/2013. **CBC 05/18/2018>> absolute eosinophil count equals 300  Medication:    Current Outpatient Medications:  .  albuterol (PROVENTIL HFA;VENTOLIN HFA) 108 (90 Base) MCG/ACT inhaler, Inhale 2 puffs into the lungs every 4 (four) hours as needed for wheezing or shortness of breath., Disp: , Rfl:  .  atenolol (TENORMIN) 50 MG tablet, Take 50 mg by mouth every morning. , Disp: , Rfl:  .  clotrimazole-betamethasone (LOTRISONE) cream, Apply 1 application topically 2 (two) times daily as needed (for irritation)., Disp: , Rfl:  .  dextromethorphan-guaiFENesin (MUCINEX DM) 30-600 MG 12hr tablet, Take 1 tablet by mouth 2 (two) times daily., Disp: , Rfl:  .  fluticasone furoate-vilanterol (BREO ELLIPTA) 200-25 MCG/INH AEPB, Inhale 1 puff into the lungs daily as needed (for  shortness of breath or wheezing)., Disp: , Rfl:  .  latanoprost (XALATAN) 0.005 % ophthalmic solution, Place 1 drop into both eyes at bedtime., Disp: , Rfl:  .  NIFEdipine (PROCARDIA-XL/ADALAT CC) 30 MG 24 hr tablet, Take 30 mg by mouth every morning. , Disp: , Rfl:    Allergies:  Patient has no known allergies.   Review of Systems:  Constitutional: Feels well.  Cardiovascular: Denies chest pain, exertional chest pain.  Pulmonary: Denies hemoptysis, pleuritic chest pain.   The remainder of systems were reviewed and were found to be negative other than what is documented in the HPI.    Physical Examination:   VS: BP 130/80 (BP Location: Left Arm, Cuff Size: Normal)   Pulse 94   Temp 97.9 F (36.6 C) (Skin)   Ht 6' 4.5" (1.943 m)   Wt 195 lb 12.8 oz (88.8 kg)   SpO2 (!) 82%   BMI 23.52 kg/m   General Appearance: No distress  Neuro:without focal findings, mental status, speech normal, alert and oriented HEENT: PERRLA, EOM intact Pulmonary: No wheezing, No rales  CardiovascularNormal S1,S2.  No m/r/g.  Abdomen: Benign, Soft, non-tender, No masses Renal:  No costovertebral tenderness  GU:  No performed at this time. Endoc: No evident thyromegaly, no signs of acromegaly or Cushing features Skin:   warm, no rashes, no ecchymosis  Extremities: normal, no cyanosis, clubbing.     LABORATORY PANEL:   CBC No results for input(s): WBC, HGB, HCT, PLT in the last 168 hours. ------------------------------------------------------------------------------------------------------------------  Chemistries  No results for input(s): NA, K, CL, CO2, GLUCOSE, BUN, CREATININE, CALCIUM, MG, AST, ALT, ALKPHOS, BILITOT in the last 168 hours.  Invalid input(s): GFRCGP ------------------------------------------------------------------------------------------------------------------  Cardiac Enzymes No results for input(s): TROPONINI in the last 168 hours. ------------------------------------------------------------  RADIOLOGY:   No results found for this or any previous visit. Results for orders placed during the hospital encounter of 05/30/18  DG Chest 2 View   Narrative CLINICAL DATA:  Pneumothorax  EXAM: CHEST - 2 VIEW  COMPARISON:  05/20/2018  FINDINGS: Left chest tube has been removed. There is no pneumothorax. Pleural and parenchymal  changes at the left base have improved. There is persistent blunting of the left costophrenic angle. A small residual pleural effusion may be present.  IMPRESSION: Left chest tube removal without pneumothorax  Small residual left pleural effusion.   Electronically Signed   By: Marybelle Killings M.D.   On: 05/30/2018 13:04    ------------------------------------------------------------------------------------------------------------------  Thank  you for allowing [facility] to assist in the care of your patient. Our recommendations are noted above.  Please contact us if we can be of further service.   Marda Stalker, M.D., F.C.C.P.  Board Certified in Internal Medicine, Pulmonary Medicine, [place], and Sleep Medicine.  [facility] Office Number: [phone]  12/15/2018

## 2018-12-16 ENCOUNTER — Ambulatory Visit: Payer: PPO | Admitting: Internal Medicine

## 2018-12-16 ENCOUNTER — Other Ambulatory Visit: Payer: Self-pay

## 2018-12-16 ENCOUNTER — Encounter: Payer: Self-pay | Admitting: Internal Medicine

## 2018-12-16 VITALS — BP 130/80 | HR 94 | Temp 97.9°F | Ht 76.5 in | Wt 195.8 lb

## 2018-12-16 DIAGNOSIS — J9383 Other pneumothorax: Secondary | ICD-10-CM

## 2018-12-16 DIAGNOSIS — J439 Emphysema, unspecified: Secondary | ICD-10-CM | POA: Diagnosis not present

## 2018-12-16 MED ORDER — BREO ELLIPTA 200-25 MCG/INH IN AEPB: 1.0000 | INHALATION_SPRAY | Freq: Every day | RESPIRATORY_TRACT | 0 refills | Status: DC

## 2018-12-16 NOTE — Addendum Note (Signed)
Addended by: Lebron Conners on: 12/16/2018 11:28 AM   Modules accepted: Orders

## 2018-12-16 NOTE — Patient Instructions (Addendum)
Continue to use Breo, recommend that you take it every day.  Use albuterol as needed.   Try to increase your activity level.

## 2018-12-26 DIAGNOSIS — J449 Chronic obstructive pulmonary disease, unspecified: Secondary | ICD-10-CM | POA: Diagnosis not present

## 2018-12-26 DIAGNOSIS — J939 Pneumothorax, unspecified: Secondary | ICD-10-CM | POA: Diagnosis not present

## 2018-12-26 DIAGNOSIS — J9383 Other pneumothorax: Secondary | ICD-10-CM | POA: Diagnosis not present

## 2019-01-26 DIAGNOSIS — J9383 Other pneumothorax: Secondary | ICD-10-CM | POA: Diagnosis not present

## 2019-01-26 DIAGNOSIS — J449 Chronic obstructive pulmonary disease, unspecified: Secondary | ICD-10-CM | POA: Diagnosis not present

## 2019-01-26 DIAGNOSIS — J939 Pneumothorax, unspecified: Secondary | ICD-10-CM | POA: Diagnosis not present

## 2019-02-20 ENCOUNTER — Other Ambulatory Visit: Payer: Self-pay

## 2019-02-23 ENCOUNTER — Inpatient Hospital Stay: Payer: PPO | Attending: Radiation Oncology

## 2019-02-23 ENCOUNTER — Other Ambulatory Visit: Payer: Self-pay

## 2019-02-23 DIAGNOSIS — Z923 Personal history of irradiation: Secondary | ICD-10-CM | POA: Insufficient documentation

## 2019-02-23 DIAGNOSIS — C61 Malignant neoplasm of prostate: Secondary | ICD-10-CM | POA: Diagnosis not present

## 2019-02-23 LAB — PSA: Prostatic Specific Antigen: 2.72 ng/mL (ref 0.00–4.00)

## 2019-02-25 DIAGNOSIS — J939 Pneumothorax, unspecified: Secondary | ICD-10-CM | POA: Diagnosis not present

## 2019-02-25 DIAGNOSIS — J9383 Other pneumothorax: Secondary | ICD-10-CM | POA: Diagnosis not present

## 2019-02-25 DIAGNOSIS — J449 Chronic obstructive pulmonary disease, unspecified: Secondary | ICD-10-CM | POA: Diagnosis not present

## 2019-02-27 ENCOUNTER — Other Ambulatory Visit: Payer: Self-pay

## 2019-03-02 ENCOUNTER — Ambulatory Visit
Admission: RE | Admit: 2019-03-02 | Discharge: 2019-03-02 | Disposition: A | Discharge status: Home / Self Care | Payer: PPO | Source: Ambulatory Visit | Attending: Radiation Oncology | Admitting: Radiation Oncology

## 2019-03-02 ENCOUNTER — Encounter: Payer: Self-pay | Admitting: Radiation Oncology

## 2019-03-02 ENCOUNTER — Other Ambulatory Visit: Payer: Self-pay

## 2019-03-02 VITALS — BP 140/94 | HR 108 | Temp 97.6°F | Resp 16 | Wt 202.2 lb

## 2019-03-02 DIAGNOSIS — C61 Malignant neoplasm of prostate: Secondary | ICD-10-CM

## 2019-03-02 DIAGNOSIS — R9721 Rising PSA following treatment for malignant neoplasm of prostate: Secondary | ICD-10-CM | POA: Insufficient documentation

## 2019-03-02 DIAGNOSIS — Z923 Personal history of irradiation: Secondary | ICD-10-CM | POA: Insufficient documentation

## 2019-03-02 NOTE — Progress Notes (Signed)
Radiation Oncology Follow up Note  Name: Stephen Madden   Date:   03/02/2019 MRN:  [id number] DOB: 1951/04/30    This 68 y.o. male presents to the clinic today for 57-month out status post IMRT radiation therapy to his prostate for Gleason 6 (3+3) adenocarcinoma..  Patient was treated with external beam radiation therapy using IMRT treatment planning and delivery  REFERRING PROVIDER: Cletis Athens, MD  HPI: Patient is a 68 year old male now out 4 months having completed IMRT radiation therapy for T1c N0 M0 Gleason 6 (3+3) adenocarcinoma the prostate.  Seen today in routine follow-up he is doing well specifically denies diarrhea dysuria or any other GI/GU complaints.  His most recent PSA was 2.72.Marland Kitchen  COMPLICATIONS OF TREATMENT: none  FOLLOW UP COMPLIANCE: keeps appointments   PHYSICAL EXAM:  BP (!) 140/94 (BP Location: Left Arm, Patient Position: Sitting)   Pulse (!) 108   Temp 97.6 F (36.4 C) (Tympanic)   Resp 16   Wt 202 lb 3.2 oz (91.7 kg)   BMI 24.29 kg/m  Well-developed well-nourished patient in NAD. HEENT reveals PERLA, EOMI, discs not visualized.  Oral cavity is clear. No oral mucosal lesions are identified. Neck is clear without evidence of cervical or supraclavicular adenopathy. Lungs are clear to A&P. Cardiac examination is essentially unremarkable with regular rate and rhythm without murmur rub or thrill. Abdomen is benign with no organomegaly or masses noted. Motor sensory and DTR levels are equal and symmetric in the upper and lower extremities. Cranial nerves II through XII are grossly intact. Proprioception is intact. No peripheral adenopathy or edema is identified. No motor or sensory levels are noted. Crude visual fields are within normal range.  RADIOLOGY RESULTS: No current films to review  PLAN: Present time PSA is slightly elevated to normal levels which I would be custom is seen at this time which would hope would be below 1.  I have asked to see the patient back in 6  months for follow-up with a PSA repeated at that time.  Should he have progression of disease would make a referral to medical oncology as well as initiating possible androgen deprivation therapy.  Patient comprehends my recommendations well.  We will see him back in 6 months.  Patient is to call with any concerns at any time.  I would like to take this opportunity to thank you for allowing me to participate in the care of your patient.Noreene Filbert, MD

## 2019-03-28 DIAGNOSIS — J939 Pneumothorax, unspecified: Secondary | ICD-10-CM | POA: Diagnosis not present

## 2019-03-28 DIAGNOSIS — J449 Chronic obstructive pulmonary disease, unspecified: Secondary | ICD-10-CM | POA: Diagnosis not present

## 2019-03-28 DIAGNOSIS — J9383 Other pneumothorax: Secondary | ICD-10-CM | POA: Diagnosis not present

## 2019-04-27 DIAGNOSIS — J9383 Other pneumothorax: Secondary | ICD-10-CM | POA: Diagnosis not present

## 2019-04-27 DIAGNOSIS — J939 Pneumothorax, unspecified: Secondary | ICD-10-CM | POA: Diagnosis not present

## 2019-04-27 DIAGNOSIS — J449 Chronic obstructive pulmonary disease, unspecified: Secondary | ICD-10-CM | POA: Diagnosis not present

## 2019-05-28 DIAGNOSIS — J939 Pneumothorax, unspecified: Secondary | ICD-10-CM | POA: Diagnosis not present

## 2019-05-28 DIAGNOSIS — J449 Chronic obstructive pulmonary disease, unspecified: Secondary | ICD-10-CM | POA: Diagnosis not present

## 2019-05-28 DIAGNOSIS — J9383 Other pneumothorax: Secondary | ICD-10-CM | POA: Diagnosis not present

## 2019-06-10 ENCOUNTER — Encounter: Payer: Self-pay | Admitting: *Deleted

## 2019-08-06 DIAGNOSIS — H401131 Primary open-angle glaucoma, bilateral, mild stage: Secondary | ICD-10-CM | POA: Diagnosis not present

## 2019-08-17 ENCOUNTER — Telehealth: Payer: Self-pay | Admitting: Internal Medicine

## 2019-08-17 ENCOUNTER — Telehealth: Payer: Self-pay | Admitting: Cardiothoracic Surgery

## 2019-08-17 DIAGNOSIS — J449 Chronic obstructive pulmonary disease, unspecified: Secondary | ICD-10-CM | POA: Diagnosis not present

## 2019-08-17 DIAGNOSIS — I10 Essential (primary) hypertension: Secondary | ICD-10-CM | POA: Diagnosis not present

## 2019-08-17 DIAGNOSIS — R0600 Dyspnea, unspecified: Secondary | ICD-10-CM | POA: Diagnosis not present

## 2019-08-17 NOTE — Telephone Encounter (Signed)
Called and spoke to pt. Pt states he was informed by [employer] that they are needing a signature from provider regarding his O2. Called [employer] and spoke with Melissa, she states she will look into this and call back.

## 2019-08-17 NOTE — Telephone Encounter (Signed)
Patient is calling and is asking for one of the nurses to give him a call back. Please call and advise.

## 2019-08-17 NOTE — Telephone Encounter (Signed)
Patient was asking who he needed to see about getting his home Oxygen use extended. Patient instructed to contact his Pulmonologist at [facility] for this.

## 2019-08-18 NOTE — Telephone Encounter (Signed)
Called and spoke with patient and relayed the message we received back from [place] at [facility] that she is checking into it. Told patient that once we heard back from her we would let him know.   Will leave this in triage to follow up on.

## 2019-08-18 NOTE — Telephone Encounter (Signed)
Message Received: Today Message Contents  Darlina Guys sent to Jannette Spanner, CMA; Darlina Guys  Hey!  I'm checking on this one and will get back with you.   Thanks  Melissa       Previous Messages

## 2019-08-18 NOTE — Telephone Encounter (Signed)
I sent Stephen Madden a Community message asking her to update [place] on what is needed for pt. Will await her response.

## 2019-08-18 NOTE — Telephone Encounter (Signed)
Pt calling back regarding status of oxygen.  Please advise.  [phone]

## 2019-08-25 NOTE — Telephone Encounter (Signed)
I reached out to [facility]  She states that she emailed the authorization team about this pt on 3/30 and they have yet to respond  She is going to send another email now, and will call us back with an update Will leave encounter open

## 2019-08-26 ENCOUNTER — Telehealth: Payer: Self-pay | Admitting: Internal Medicine

## 2019-08-26 ENCOUNTER — Inpatient Hospital Stay: Payer: PPO | Attending: Radiation Oncology

## 2019-08-26 ENCOUNTER — Other Ambulatory Visit: Payer: Self-pay

## 2019-08-26 DIAGNOSIS — C61 Malignant neoplasm of prostate: Secondary | ICD-10-CM | POA: Diagnosis not present

## 2019-08-26 DIAGNOSIS — Z923 Personal history of irradiation: Secondary | ICD-10-CM | POA: Diagnosis not present

## 2019-08-26 LAB — PSA: Prostatic Specific Antigen: 1.47 ng/mL (ref 0.00–4.00)

## 2019-08-26 NOTE — Telephone Encounter (Signed)
Melissa from [facility] called and said they have what they need and do not need a call back. If needed the callback # is [phone]

## 2019-08-27 DIAGNOSIS — J9383 Other pneumothorax: Secondary | ICD-10-CM | POA: Diagnosis not present

## 2019-08-27 DIAGNOSIS — J449 Chronic obstructive pulmonary disease, unspecified: Secondary | ICD-10-CM | POA: Diagnosis not present

## 2019-08-27 DIAGNOSIS — J939 Pneumothorax, unspecified: Secondary | ICD-10-CM | POA: Diagnosis not present

## 2019-08-28 NOTE — Telephone Encounter (Signed)
Seems like encounter was open in error so closing encounter.  

## 2019-08-31 ENCOUNTER — Ambulatory Visit: Payer: PPO | Admitting: Radiation Oncology

## 2019-09-02 ENCOUNTER — Ambulatory Visit: Payer: PPO | Admitting: Radiation Oncology

## 2019-09-08 ENCOUNTER — Other Ambulatory Visit: Payer: Self-pay

## 2019-09-08 ENCOUNTER — Encounter: Payer: Self-pay | Admitting: Radiation Oncology

## 2019-09-10 ENCOUNTER — Encounter: Payer: Self-pay | Admitting: Radiation Oncology

## 2019-09-10 ENCOUNTER — Other Ambulatory Visit: Payer: Self-pay

## 2019-09-10 ENCOUNTER — Ambulatory Visit
Admission: RE | Admit: 2019-09-10 | Discharge: 2019-09-10 | Disposition: A | Discharge status: Home / Self Care | Payer: PPO | Source: Ambulatory Visit | Attending: Radiation Oncology | Admitting: Radiation Oncology

## 2019-09-10 VITALS — BP 131/79 | HR 98 | Temp 97.0°F | Wt 201.0 lb

## 2019-09-10 DIAGNOSIS — C61 Malignant neoplasm of prostate: Secondary | ICD-10-CM | POA: Diagnosis not present

## 2019-09-10 NOTE — Progress Notes (Signed)
Radiation Oncology Follow up Note  Name: Stephen Madden   Date:   09/10/2019 MRN:  [id number] DOB: [birth year]    This 69 y.o. male presents to the clinic today for 65-month follow-up status post IMRT radiation therapy to his prostate for Gleason 6 (3+3) adenocarcinoma.Marland Kitchen  REFERRING PROVIDER: Cletis Athens, MD  HPI: Patient is a 69 year old male now at 61 months having completed IMRT radiation therapy to his prostate for Gleason 6 adenocarcinoma.  Seen today in routine follow-up he is doing well.  He specifically denies any increased lower urinary tract symptoms diarrhea or fatigue.Marland Kitchen  His most recent PSA is 1.47 down from 2.76 months prior.  COMPLICATIONS OF TREATMENT: none  FOLLOW UP COMPLIANCE: keeps appointments   PHYSICAL EXAM:  BP 131/79 (BP Location: Left Arm, Patient Position: Sitting, Cuff Size: Normal)   Pulse 98   Temp (!) 97 F (36.1 C) (Tympanic)   Wt 201 lb (91.2 kg)   BMI 24.15 kg/m  Well-developed well-nourished patient in NAD. HEENT reveals PERLA, EOMI, discs not visualized.  Oral cavity is clear. No oral mucosal lesions are identified. Neck is clear without evidence of cervical or supraclavicular adenopathy. Lungs are clear to A&P. Cardiac examination is essentially unremarkable with regular rate and rhythm without murmur rub or thrill. Abdomen is benign with no organomegaly or masses noted. Motor sensory and DTR levels are equal and symmetric in the upper and lower extremities. Cranial nerves II through XII are grossly intact. Proprioception is intact. No peripheral adenopathy or edema is identified. No motor or sensory levels are noted. Crude visual fields are within normal range.  RADIOLOGY RESULTS: No current films to review  PLAN:Present time patient is doing well with excellent trend down in his PSA.  I am pleased with his overall progress.  I have asked [place] to see him back in 1 year for follow-up with a PSA.  Patient is to call sooner with any concerns.  I would like  to take this opportunity to thank you for allowing me to participate in the care of your patient.Noreene Filbert, MD

## 2019-09-26 DIAGNOSIS — J449 Chronic obstructive pulmonary disease, unspecified: Secondary | ICD-10-CM | POA: Diagnosis not present

## 2019-09-26 DIAGNOSIS — J939 Pneumothorax, unspecified: Secondary | ICD-10-CM | POA: Diagnosis not present

## 2019-09-26 DIAGNOSIS — J9383 Other pneumothorax: Secondary | ICD-10-CM | POA: Diagnosis not present

## 2019-10-23 ENCOUNTER — Other Ambulatory Visit: Payer: Self-pay | Admitting: *Deleted

## 2019-10-23 ENCOUNTER — Other Ambulatory Visit: Payer: Self-pay

## 2019-10-23 ENCOUNTER — Ambulatory Visit: Payer: PPO | Admitting: Internal Medicine

## 2019-10-23 MED ORDER — IPRATROPIUM BROMIDE 0.02 % IN SOLN: 0.5000 mg | RESPIRATORY_TRACT | 12 refills | Status: DC | PRN

## 2019-10-27 DIAGNOSIS — J449 Chronic obstructive pulmonary disease, unspecified: Secondary | ICD-10-CM | POA: Diagnosis not present

## 2019-10-27 DIAGNOSIS — J939 Pneumothorax, unspecified: Secondary | ICD-10-CM | POA: Diagnosis not present

## 2019-10-27 DIAGNOSIS — J9383 Other pneumothorax: Secondary | ICD-10-CM | POA: Diagnosis not present

## 2019-11-26 DIAGNOSIS — J449 Chronic obstructive pulmonary disease, unspecified: Secondary | ICD-10-CM | POA: Diagnosis not present

## 2019-11-26 DIAGNOSIS — J9383 Other pneumothorax: Secondary | ICD-10-CM | POA: Diagnosis not present

## 2019-11-26 DIAGNOSIS — J939 Pneumothorax, unspecified: Secondary | ICD-10-CM | POA: Diagnosis not present

## 2019-12-15 ENCOUNTER — Other Ambulatory Visit: Payer: Self-pay

## 2019-12-21 ENCOUNTER — Other Ambulatory Visit: Payer: Self-pay

## 2019-12-21 ENCOUNTER — Encounter: Payer: Self-pay | Admitting: Primary Care

## 2019-12-21 ENCOUNTER — Ambulatory Visit: Payer: PPO | Admitting: Primary Care

## 2019-12-21 ENCOUNTER — Ambulatory Visit
Admission: RE | Admit: 2019-12-21 | Discharge: 2019-12-21 | Disposition: A | Discharge status: Home / Self Care | Payer: PPO | Source: Ambulatory Visit | Attending: Primary Care | Admitting: Primary Care

## 2019-12-21 ENCOUNTER — Ambulatory Visit
Admission: RE | Admit: 2019-12-21 | Discharge: 2019-12-21 | Disposition: A | Discharge status: Home / Self Care | Payer: PPO | Attending: Primary Care | Admitting: Primary Care

## 2019-12-21 VITALS — BP 128/76 | HR 83 | Temp 97.7°F | Ht 76.5 in | Wt 199.2 lb

## 2019-12-21 DIAGNOSIS — J9383 Other pneumothorax: Secondary | ICD-10-CM

## 2019-12-21 DIAGNOSIS — J449 Chronic obstructive pulmonary disease, unspecified: Secondary | ICD-10-CM | POA: Diagnosis not present

## 2019-12-21 DIAGNOSIS — Z0000 Encounter for general adult medical examination without abnormal findings: Secondary | ICD-10-CM

## 2019-12-21 DIAGNOSIS — J939 Pneumothorax, unspecified: Secondary | ICD-10-CM | POA: Diagnosis not present

## 2019-12-21 DIAGNOSIS — G4734 Idiopathic sleep related nonobstructive alveolar hypoventilation: Secondary | ICD-10-CM

## 2019-12-21 MED ORDER — BREO ELLIPTA 200-25 MCG/INH IN AEPB: 1.0000 | INHALATION_SPRAY | Freq: Every day | RESPIRATORY_TRACT | 11 refills | Status: DC | PRN

## 2019-12-21 NOTE — Progress Notes (Signed)
@Patient  ID: Stephen Madden, male    DOB: [birth year], 69 y.o.   MRN: [id number]  Chief Complaint  Patient presents with  . Follow-up    former Dr. Myles Lipps sob with exertion, dry cough at times prod with clear mucus and occ wheezing    Referring provider: Cletis Athens, MD  HPI: 69 year old male, former smoker. PMH significant for recurrent spontaneous pneumothorax, prostate cancer, rectal polyps, acute kidney injury, BNP, elevated PSA. Former patient of Dr. Ashby Dawes, last seen on 12/16/18.   12/21/2019 Patient presents today for 1 year follow-up visit. He continues to have some shortness of breath. He is complaint with BREO Ellipta 200. He is prescribed nocturnal oxygen but only uses it when on his treadmill which he is currently not using d/t visitors staying with him and storage reasons. He has occasional left sided chest discomfort, not currently experiencing. Mostly occurs after working outside and come in to sit down. If he is able to reposition the discomfort goes away. His breathing is ok, he states that he experiences shortness of breath because he is not exercising. This has been interrupted since February 2021. He is unable to walk outside d.t heat/humidity.  He is hopeful to get back to using his treadmill this month. He uses nebulizer once a day or once every other day. States that it helps relaxing. He has cough related to allergies. He takes over the counter antihistamine which has significantly helped.    No Known Allergies  Immunization History  Administered Date(s) Administered  . Influenza, High Dose Seasonal PF 06/12/2017  . Influenza-Unspecified 03/21/2018  . Moderna SARS-COVID-2 Vaccination 06/10/2018, 07/09/2018    Past Medical History:  Diagnosis Date  . BPH with obstruction/lower urinary tract symptoms   . Cancer [facility])    prostate  . COPD (chronic obstructive pulmonary disease) ([place])   . Elevated PSA   . Hypertension   . Pneumothorax 02/23/2018    left  . Tobacco abuse     Tobacco History: Social History   Tobacco Use  Smoking Status Former Smoker  . Packs/day: 1.00  . Years: 30.00  . Pack years: 30.00  . Types: Cigarettes  . Quit date: 11/19/2016  . Years since quitting: 3.0  Smokeless Tobacco Never Used   Counseling given: Not Answered   Outpatient Medications Prior to Visit  Medication Sig Dispense Refill  . atenolol (TENORMIN) 50 MG tablet Take 50 mg by mouth every morning.     . chlorpheniramine (CHLOR-TRIMETON) 4 MG tablet Take 4 mg by mouth 2 (two) times daily as needed for allergies.    . clotrimazole-betamethasone (LOTRISONE) cream Apply 1 application topically 2 (two) times daily as needed (for irritation).    Marland Kitchen latanoprost (XALATAN) 0.005 % ophthalmic solution Place 1 drop into both eyes at bedtime.    Marland Kitchen NIFEdipine (PROCARDIA-XL/ADALAT CC) 30 MG 24 hr tablet Take 30 mg by mouth every morning.     . fluticasone furoate-vilanterol (BREO ELLIPTA) 200-25 MCG/INH AEPB Inhale 1 puff into the lungs daily as needed (for shortness of breath or wheezing).    Marland Kitchen albuterol (PROVENTIL HFA;VENTOLIN HFA) 108 (90 Base) MCG/ACT inhaler Inhale 2 puffs into the lungs every 4 (four) hours as needed for wheezing or shortness of breath. (Patient not taking: Reported on 12/21/2019)    . dextromethorphan-guaiFENesin (MUCINEX DM) 30-600 MG 12hr tablet Take 1 tablet by mouth 2 (two) times daily.    . fluticasone furoate-vilanterol (BREO ELLIPTA) 200-25 MCG/INH AEPB Inhale 1 puff into the lungs  daily. 2 each 0  . ipratropium (ATROVENT) 0.02 % nebulizer solution Take 2.5 mLs (0.5 mg total) by nebulization every 4 (four) hours as needed for wheezing or shortness of breath. 75 mL 12   No facility-administered medications prior to visit.    Review of Systems  Review of Systems  Constitutional: Negative.   Respiratory: Positive for chest tightness and shortness of breath. Negative for wheezing.    Physical Exam  BP 128/76 (BP Location: Left  Arm, Cuff Size: Normal)   Pulse 83   Temp 97.7 F (36.5 C) (Temporal)   Ht 6' 4.5" (1.943 m)   Wt 199 lb 3.2 oz (90.4 kg)   SpO2 95%   BMI 23.93 kg/m  Physical Exam Constitutional:      Appearance: Normal appearance.  Cardiovascular:     Rate and Rhythm: Normal rate and regular rhythm.  Pulmonary:     Effort: Pulmonary effort is normal.     Breath sounds: Normal breath sounds.  Musculoskeletal:        General: Normal range of motion.  Skin:    General: Skin is warm and dry.  Neurological:     General: No focal deficit present.     Mental Status: He is alert and oriented to person, place, and time. Mental status is at baseline.  Psychiatric:        Mood and Affect: Mood normal.        Behavior: Behavior normal.        Thought Content: Thought content normal.        Judgment: Judgment normal.      Lab Results:  CBC    Component Value Date/Time   WBC 5.1 10/07/2018 1034   RBC 4.24 10/07/2018 1034   HGB 12.8 (L) 10/07/2018 1034   HGB 15.3 07/30/2016 0914   HCT 38.8 (L) 10/07/2018 1034   HCT 44.9 07/30/2016 0914   PLT 191 10/07/2018 1034   PLT 191 07/30/2016 0914   MCV 91.5 10/07/2018 1034   MCV 94 07/30/2016 0914   MCH 30.2 10/07/2018 1034   MCHC 33.0 10/07/2018 1034   RDW 13.7 10/07/2018 1034   RDW 14.5 07/30/2016 0914   LYMPHSABS 1.5 05/18/2018 0436   MONOABS 1.4 (H) 05/18/2018 0436   EOSABS 0.3 05/18/2018 0436   BASOSABS 0.0 05/18/2018 0436    BMET    Component Value Date/Time   NA 141 05/19/2018 0647   NA 146 (H) 07/30/2016 0914   K 3.8 05/19/2018 0647   CL 107 05/19/2018 0647   CO2 26 05/19/2018 0647   GLUCOSE 110 (H) 05/19/2018 0647   BUN 27 (H) 05/19/2018 0647   BUN 14 07/30/2016 0914   CREATININE 1.23 05/19/2018 0647   CALCIUM 8.8 (L) 05/19/2018 0647   GFRNONAA 60 (L) 05/19/2018 0647   GFRAA >60 05/19/2018 0647    BNP No results found for: BNP  ProBNP No results found for: PROBNP  Imaging: No results found.   Assessment & Plan:     COPD (chronic obstructive pulmonary disease) (HCC) - Stable interval; no recent exacerbations or hospitalizations - Continue BREO 200 one puff daily and prn Albuterol hfa/neb q 6 hours for sob/wheezing - FU in 1 year  Recurrent spontaneous pneumothorax - Repeat CXR today to monitor  Health care maintenance - Patient would like new primary care provider - Refer to internal medicine for routine health care/screening   Nocturnal hypoxia - Check ONO on room air, if normal can discontinue oxygen    Benjamine Mola  Teresita Madura, NP 12/21/2019

## 2019-12-21 NOTE — Assessment & Plan Note (Signed)
-   Patient would like new primary care provider - Refer to internal medicine for routine health care/screening

## 2019-12-21 NOTE — Assessment & Plan Note (Signed)
-   Repeat CXR today to monitor

## 2019-12-21 NOTE — Assessment & Plan Note (Signed)
-   Stable interval; no recent exacerbations or hospitalizations - Continue BREO 200 one puff daily and prn Albuterol hfa/neb q 6 hours for sob/wheezing - FU in 1 year

## 2019-12-21 NOTE — Patient Instructions (Addendum)
Pleasure seeing you today Stephen Madden  Orders: CXR today  ONO on room air  Recommendations: Continue Breo 200 one puff once daily   Referral: Internal medicine   Follow-up: 1 year with Stephen Madden (new patient)

## 2019-12-21 NOTE — Assessment & Plan Note (Signed)
-   Check ONO on room air, if normal can discontinue oxygen

## 2019-12-21 NOTE — Progress Notes (Signed)
Please let patient now CXR looked fine. It showed no evidence of pneumothorax. No active cardiopulmonary disease.

## 2019-12-27 DIAGNOSIS — J9383 Other pneumothorax: Secondary | ICD-10-CM | POA: Diagnosis not present

## 2019-12-27 DIAGNOSIS — J939 Pneumothorax, unspecified: Secondary | ICD-10-CM | POA: Diagnosis not present

## 2019-12-27 DIAGNOSIS — J449 Chronic obstructive pulmonary disease, unspecified: Secondary | ICD-10-CM | POA: Diagnosis not present

## 2019-12-29 NOTE — Progress Notes (Signed)
Agree with the details of the visit as noted below by Geraldo Pitter, NP.  Renold Don, MD [facility]

## 2020-01-04 ENCOUNTER — Encounter: Payer: Self-pay | Admitting: Primary Care

## 2020-01-04 DIAGNOSIS — G4730 Sleep apnea, unspecified: Secondary | ICD-10-CM | POA: Diagnosis not present

## 2020-01-04 DIAGNOSIS — R0683 Snoring: Secondary | ICD-10-CM | POA: Diagnosis not present

## 2020-01-06 DIAGNOSIS — J9383 Other pneumothorax: Secondary | ICD-10-CM | POA: Diagnosis not present

## 2020-01-06 DIAGNOSIS — J449 Chronic obstructive pulmonary disease, unspecified: Secondary | ICD-10-CM | POA: Diagnosis not present

## 2020-01-06 DIAGNOSIS — J939 Pneumothorax, unspecified: Secondary | ICD-10-CM | POA: Diagnosis not present

## 2020-01-08 ENCOUNTER — Telehealth: Payer: Self-pay | Admitting: Primary Care

## 2020-01-08 NOTE — Telephone Encounter (Signed)
ONO on 01/04/2020 showed  SPO2 low 77%, baseline 87%. He spent 5 hours 43 minutes with an SPO2 <88%. Patient needs to continue to wear 2.5-3 L of oxygen at night.

## 2020-01-08 NOTE — Telephone Encounter (Signed)
Left message for patient to call back on Monday morning.

## 2020-01-15 NOTE — Telephone Encounter (Signed)
Patient would like results of pulse oximetry overnight. Patient phone number is [phone].

## 2020-01-15 NOTE — Telephone Encounter (Signed)
Spoke with the pt and notified of results per Beth  Pt verbalized understanding  He states will continue on 02 with sleep  Nothing further needed

## 2020-01-18 ENCOUNTER — Other Ambulatory Visit: Payer: Self-pay | Admitting: *Deleted

## 2020-01-18 MED ORDER — NIFEDIPINE ER 30 MG PO TB24: 30.0000 mg | ORAL_TABLET | ORAL | 6 refills | Status: DC

## 2020-01-21 DIAGNOSIS — J449 Chronic obstructive pulmonary disease, unspecified: Secondary | ICD-10-CM | POA: Diagnosis not present

## 2020-01-21 DIAGNOSIS — J939 Pneumothorax, unspecified: Secondary | ICD-10-CM | POA: Diagnosis not present

## 2020-01-21 DIAGNOSIS — J9383 Other pneumothorax: Secondary | ICD-10-CM | POA: Diagnosis not present

## 2020-01-27 DIAGNOSIS — J9383 Other pneumothorax: Secondary | ICD-10-CM | POA: Diagnosis not present

## 2020-01-27 DIAGNOSIS — J449 Chronic obstructive pulmonary disease, unspecified: Secondary | ICD-10-CM | POA: Diagnosis not present

## 2020-01-27 DIAGNOSIS — J939 Pneumothorax, unspecified: Secondary | ICD-10-CM | POA: Diagnosis not present

## 2020-02-03 DIAGNOSIS — H401131 Primary open-angle glaucoma, bilateral, mild stage: Secondary | ICD-10-CM | POA: Diagnosis not present

## 2020-02-08 ENCOUNTER — Encounter: Payer: Self-pay | Admitting: *Deleted

## 2020-02-11 DIAGNOSIS — H401131 Primary open-angle glaucoma, bilateral, mild stage: Secondary | ICD-10-CM | POA: Diagnosis not present

## 2020-02-25 DIAGNOSIS — J939 Pneumothorax, unspecified: Secondary | ICD-10-CM | POA: Diagnosis not present

## 2020-02-25 DIAGNOSIS — J9383 Other pneumothorax: Secondary | ICD-10-CM | POA: Diagnosis not present

## 2020-02-25 DIAGNOSIS — J449 Chronic obstructive pulmonary disease, unspecified: Secondary | ICD-10-CM | POA: Diagnosis not present

## 2020-02-26 DIAGNOSIS — J449 Chronic obstructive pulmonary disease, unspecified: Secondary | ICD-10-CM | POA: Diagnosis not present

## 2020-02-26 DIAGNOSIS — J939 Pneumothorax, unspecified: Secondary | ICD-10-CM | POA: Diagnosis not present

## 2020-02-26 DIAGNOSIS — J9383 Other pneumothorax: Secondary | ICD-10-CM | POA: Diagnosis not present

## 2020-03-03 ENCOUNTER — Other Ambulatory Visit: Payer: Self-pay

## 2020-03-03 ENCOUNTER — Inpatient Hospital Stay: Payer: PPO | Attending: Radiation Oncology

## 2020-03-03 DIAGNOSIS — C61 Malignant neoplasm of prostate: Secondary | ICD-10-CM | POA: Insufficient documentation

## 2020-03-03 LAB — PSA: Prostatic Specific Antigen: 0.96 ng/mL (ref 0.00–4.00)

## 2020-03-10 ENCOUNTER — Ambulatory Visit: Payer: PPO | Admitting: Radiation Oncology

## 2020-03-10 DIAGNOSIS — H401131 Primary open-angle glaucoma, bilateral, mild stage: Secondary | ICD-10-CM | POA: Diagnosis not present

## 2020-03-15 ENCOUNTER — Encounter: Payer: Self-pay | Admitting: Radiation Oncology

## 2020-03-16 ENCOUNTER — Encounter: Payer: Self-pay | Admitting: Radiation Oncology

## 2020-03-16 ENCOUNTER — Other Ambulatory Visit: Payer: Self-pay

## 2020-03-16 ENCOUNTER — Ambulatory Visit
Admission: RE | Admit: 2020-03-16 | Discharge: 2020-03-16 | Disposition: A | Discharge status: Home / Self Care | Payer: PPO | Source: Ambulatory Visit | Attending: Radiation Oncology | Admitting: Radiation Oncology

## 2020-03-16 VITALS — BP 146/92 | HR 92 | Temp 96.9°F | Wt 202.0 lb

## 2020-03-16 DIAGNOSIS — C61 Malignant neoplasm of prostate: Secondary | ICD-10-CM | POA: Diagnosis not present

## 2020-03-16 NOTE — Progress Notes (Signed)
Radiation Oncology Follow up Note  Name: Stephen Madden   Date:   03/16/2020 MRN:  [id number] DOB: 10/23/1950    This 69 y.o. male presents to the clinic today for 14-month follow-up status post IMRT radiation therapy to his prostate for Gleason 6 (3+3) adenocarcinoma.Marland Kitchen  REFERRING PROVIDER: Cletis Athens, MD  HPI: Patient is a 69 year old male now out 18 months having completed IMRT radiation therapy to his prostate for Gleason 6 adenocarcinoma.  He is seen today in routine follow-up is doing well specifically denies any increased lower urinary tract symptoms diarrhea or fatigue.Marland Kitchen  His PSA continues a favorable trend was 0.962 weeks ago down from 1.476 months ago.  COMPLICATIONS OF TREATMENT: none  FOLLOW UP COMPLIANCE: keeps appointments   PHYSICAL EXAM:  BP (!) 146/92 (BP Location: Left Arm, Patient Position: Sitting, Cuff Size: Normal)    Pulse 92    Temp (!) 96.9 F (36.1 C) (Tympanic)    Wt 202 lb (91.6 kg)    BMI 24.27 kg/m  Well-developed well-nourished patient in NAD. HEENT reveals PERLA, EOMI, discs not visualized.  Oral cavity is clear. No oral mucosal lesions are identified. Neck is clear without evidence of cervical or supraclavicular adenopathy. Lungs are clear to A&P. Cardiac examination is essentially unremarkable with regular rate and rhythm without murmur rub or thrill. Abdomen is benign with no organomegaly or masses noted. Motor sensory and DTR levels are equal and symmetric in the upper and lower extremities. Cranial nerves II through XII are grossly intact. Proprioception is intact. No peripheral adenopathy or edema is identified. No motor or sensory levels are noted. Crude visual fields are within normal range.  RADIOLOGY RESULTS: No current films to review  PLAN: Present time patient continues to do well under excellent biochemical control of his prostate cancer.  I am pleased with his overall progress.  I have asked to see him back in 1 year for follow-up with a PSA at  that time.  Patient knows to call with any concerns.  I would like to take this opportunity to thank you for allowing me to participate in the care of your patient.Noreene Filbert, MD

## 2020-03-27 DIAGNOSIS — J449 Chronic obstructive pulmonary disease, unspecified: Secondary | ICD-10-CM | POA: Diagnosis not present

## 2020-03-27 DIAGNOSIS — J939 Pneumothorax, unspecified: Secondary | ICD-10-CM | POA: Diagnosis not present

## 2020-03-27 DIAGNOSIS — J9383 Other pneumothorax: Secondary | ICD-10-CM | POA: Diagnosis not present

## 2020-03-28 DIAGNOSIS — J939 Pneumothorax, unspecified: Secondary | ICD-10-CM | POA: Diagnosis not present

## 2020-03-28 DIAGNOSIS — J449 Chronic obstructive pulmonary disease, unspecified: Secondary | ICD-10-CM | POA: Diagnosis not present

## 2020-03-28 DIAGNOSIS — J9383 Other pneumothorax: Secondary | ICD-10-CM | POA: Diagnosis not present

## 2020-04-07 DIAGNOSIS — H401131 Primary open-angle glaucoma, bilateral, mild stage: Secondary | ICD-10-CM | POA: Diagnosis not present

## 2020-04-26 DIAGNOSIS — J9383 Other pneumothorax: Secondary | ICD-10-CM | POA: Diagnosis not present

## 2020-04-26 DIAGNOSIS — J449 Chronic obstructive pulmonary disease, unspecified: Secondary | ICD-10-CM | POA: Diagnosis not present

## 2020-04-26 DIAGNOSIS — J939 Pneumothorax, unspecified: Secondary | ICD-10-CM | POA: Diagnosis not present

## 2020-04-27 DIAGNOSIS — J939 Pneumothorax, unspecified: Secondary | ICD-10-CM | POA: Diagnosis not present

## 2020-04-27 DIAGNOSIS — J449 Chronic obstructive pulmonary disease, unspecified: Secondary | ICD-10-CM | POA: Diagnosis not present

## 2020-04-27 DIAGNOSIS — J9383 Other pneumothorax: Secondary | ICD-10-CM | POA: Diagnosis not present

## 2020-05-27 DIAGNOSIS — J449 Chronic obstructive pulmonary disease, unspecified: Secondary | ICD-10-CM | POA: Diagnosis not present

## 2020-05-27 DIAGNOSIS — J9383 Other pneumothorax: Secondary | ICD-10-CM | POA: Diagnosis not present

## 2020-05-27 DIAGNOSIS — J939 Pneumothorax, unspecified: Secondary | ICD-10-CM | POA: Diagnosis not present

## 2020-05-28 DIAGNOSIS — J9383 Other pneumothorax: Secondary | ICD-10-CM | POA: Diagnosis not present

## 2020-05-28 DIAGNOSIS — J449 Chronic obstructive pulmonary disease, unspecified: Secondary | ICD-10-CM | POA: Diagnosis not present

## 2020-05-28 DIAGNOSIS — J939 Pneumothorax, unspecified: Secondary | ICD-10-CM | POA: Diagnosis not present

## 2020-05-28 IMAGING — DX DG CHEST 1V PORT
2 series · 2 of 2 positions shown · non-contrast
Comparison: Chest x-ray of July 04, 2015

CLINICAL DATA: Increasing shortness of breath, dyspnea on exertion
for the past 2 days. Current smoker.

EXAM:
PORTABLE CHEST 1 VIEW

[chest ap (1 of 2)]
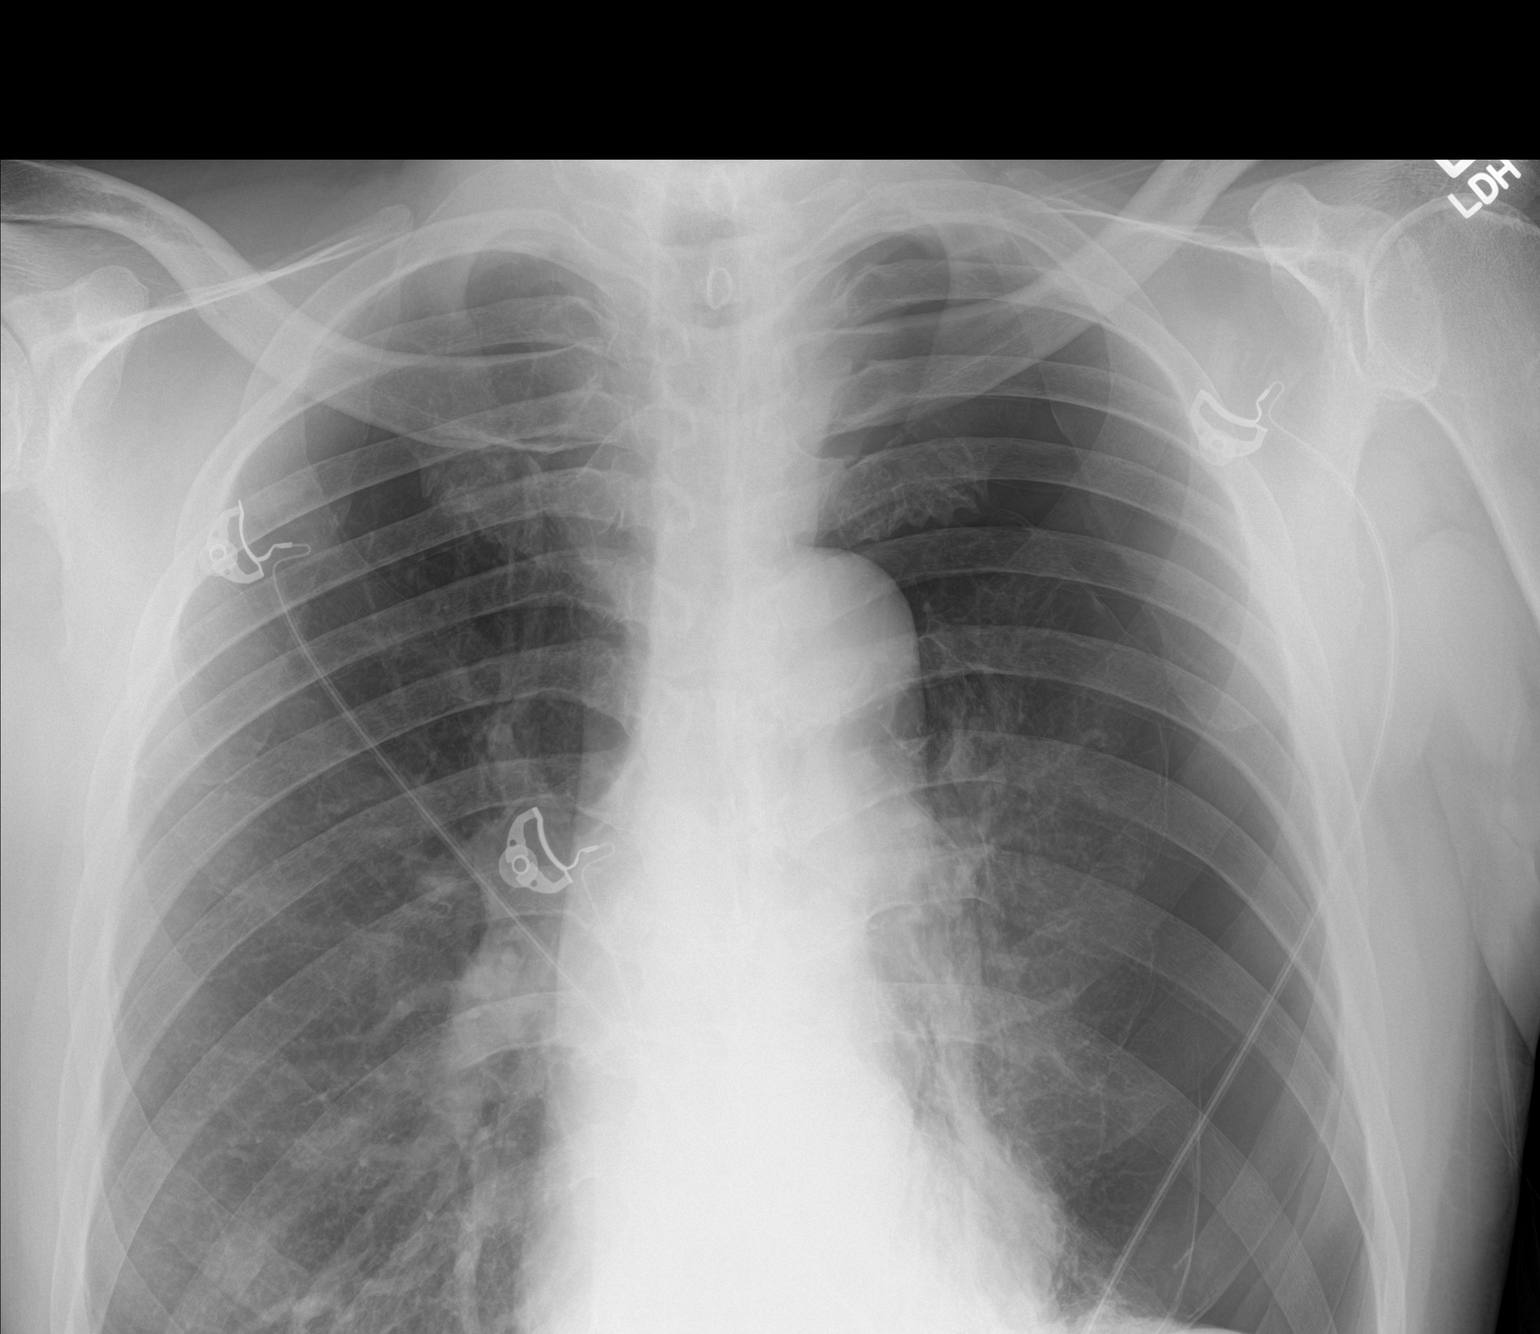

[chest ap (2 of 2)]
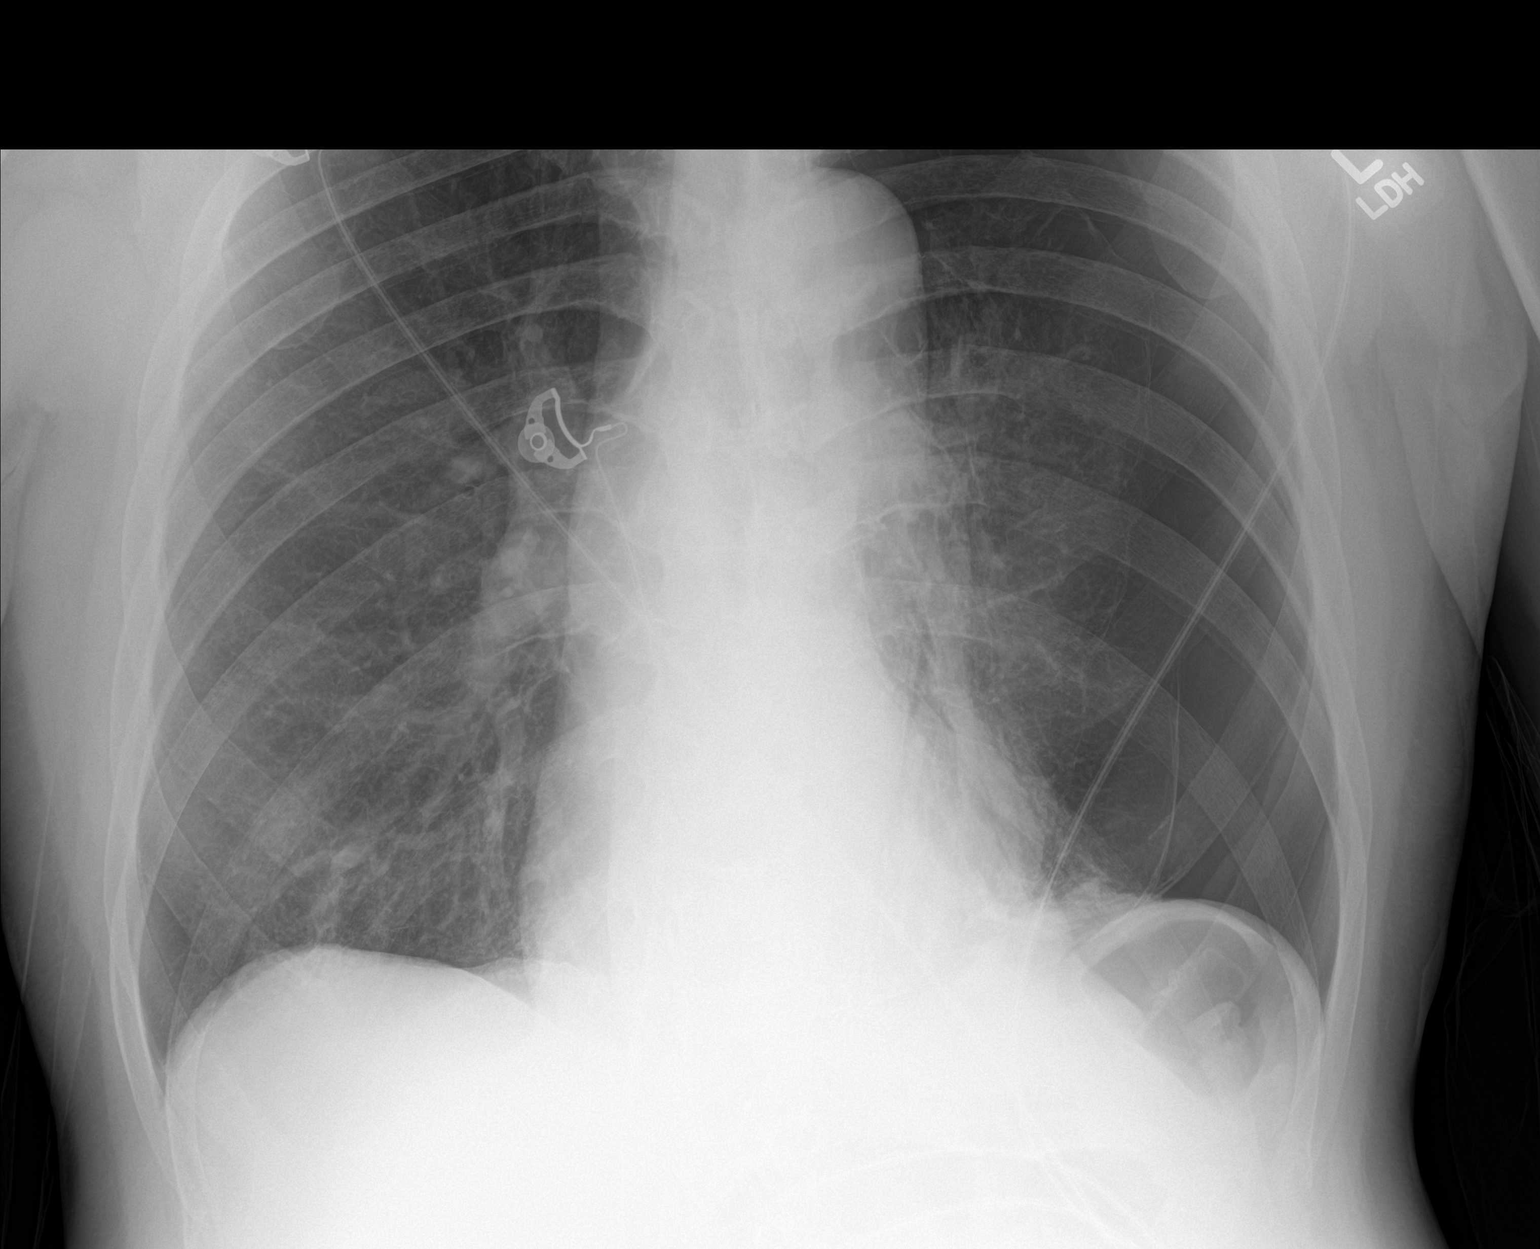

[2 of 2 positions shown; findings below may reference images not displayed]

FINDINGS: There is an approximately 50% left pneumothorax. There is no shift
of the mediastinum. The right lung is well-expanded and clear. The
heart and pulmonary vascularity are not normal. The bony thorax
exhibits no acute abnormality.
IMPRESSION: Approximately 50% left pneumothorax. Probable underlying COPD. These
results were called by telephone at the time of interpretation on
02/24/2018 at [DATE] to Dr. AWITOR MANSEH , who verbally
acknowledged these results.

## 2020-05-29 IMAGING — DX DG CHEST 1V PORT
1 series · 1 of 1 positions shown · non-contrast
Comparison: 02/24/2018.

CLINICAL DATA: 67-year-old male with left-sided pneumothorax.
Subsequent encounter.

EXAM:
PORTABLE CHEST 1 VIEW

[chest ap]
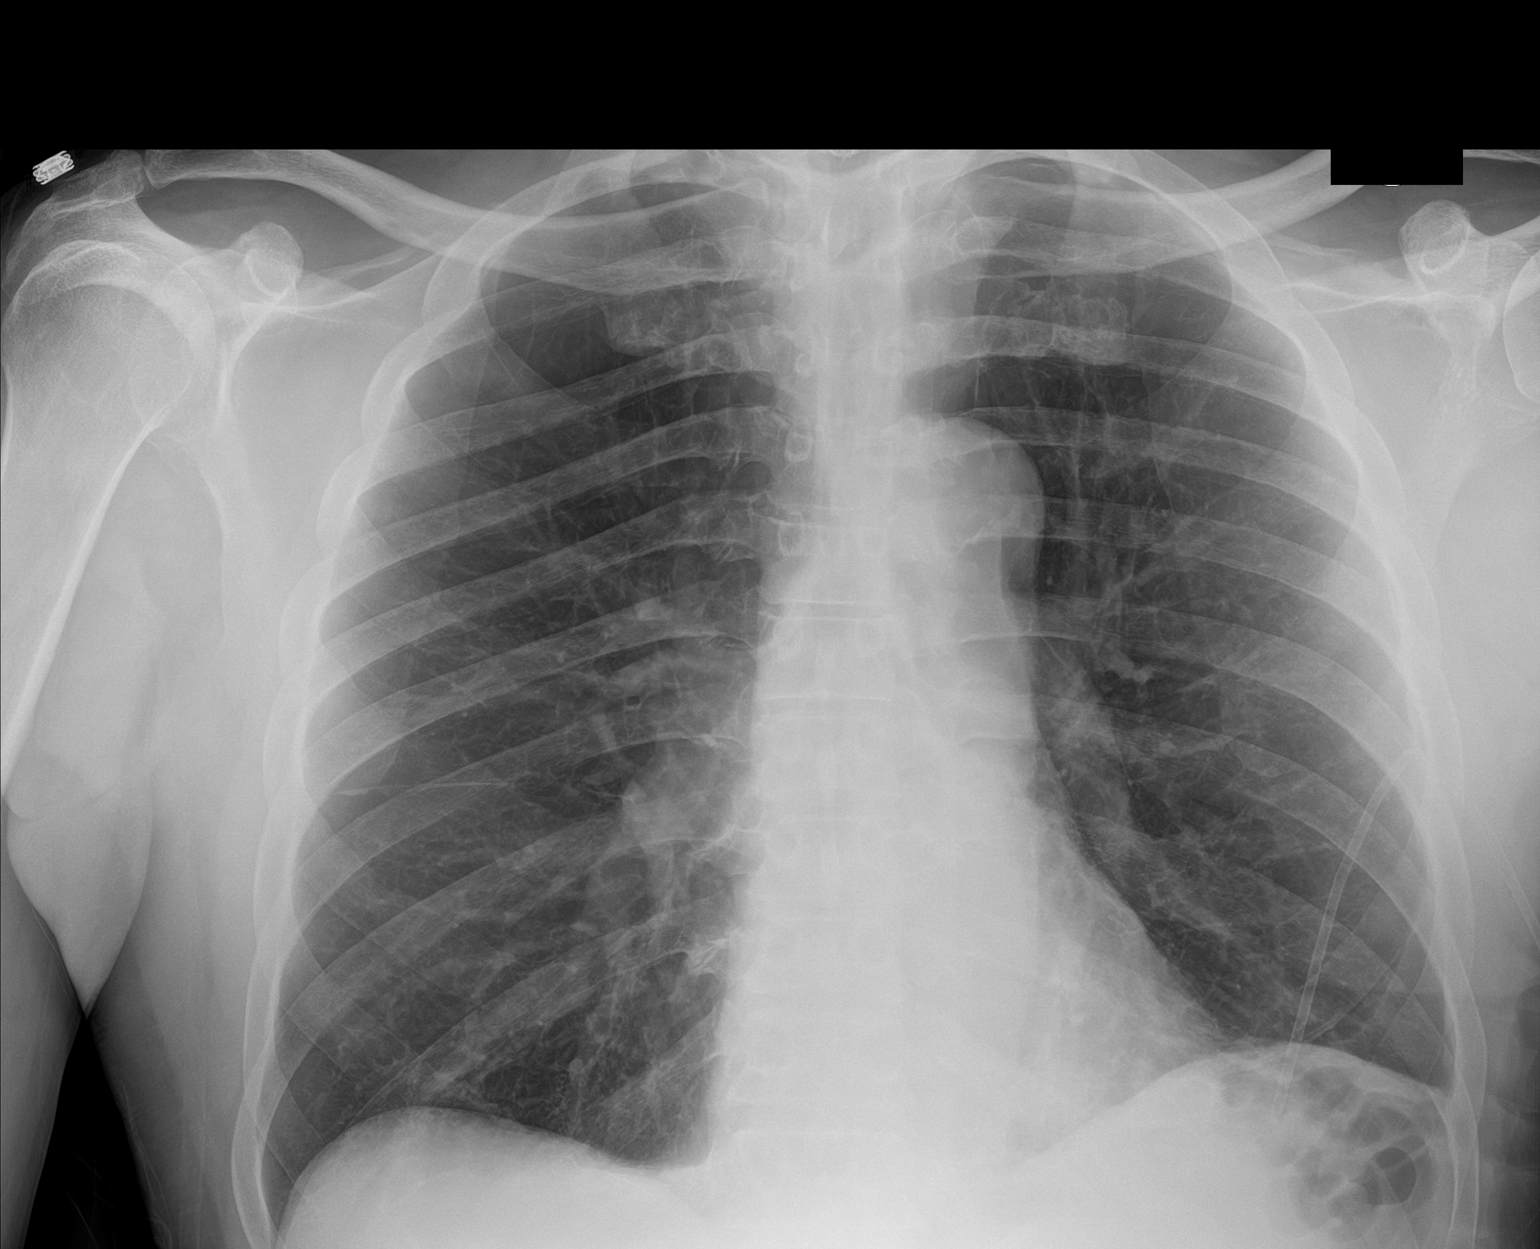

[1 of 1 positions shown; findings below may reference images not displayed]

FINDINGS: Left-sided chest tube in place with kink of chest tube as previously
noted. Residual left pneumothorax located apically and medially
approximately 5-10% and without significant change.

Left base subsegmental atelectasis slightly improved.

No pulmonary edema. No plain film evidence of pulmonary malignancy.
Heart size within normal limits. Calcified aorta.
IMPRESSION: 1. Left-sided chest tube in place with kink of chest tube as
previously noted.
2. Residual left pneumothorax located apically and medially
approximately 5-10% and without significant change.
3. Left base subsegmental atelectasis slightly improved.

## 2020-05-30 DIAGNOSIS — Z20822 Contact with and (suspected) exposure to covid-19: Secondary | ICD-10-CM | POA: Diagnosis not present

## 2020-05-30 IMAGING — CR DG CHEST 2V
2 series · 2 of 2 positions shown · non-contrast
Comparison: 5445 hours on the same day

CLINICAL DATA: Postop, chest tube.

EXAM:
CHEST - 2 VIEW

[chest pa]
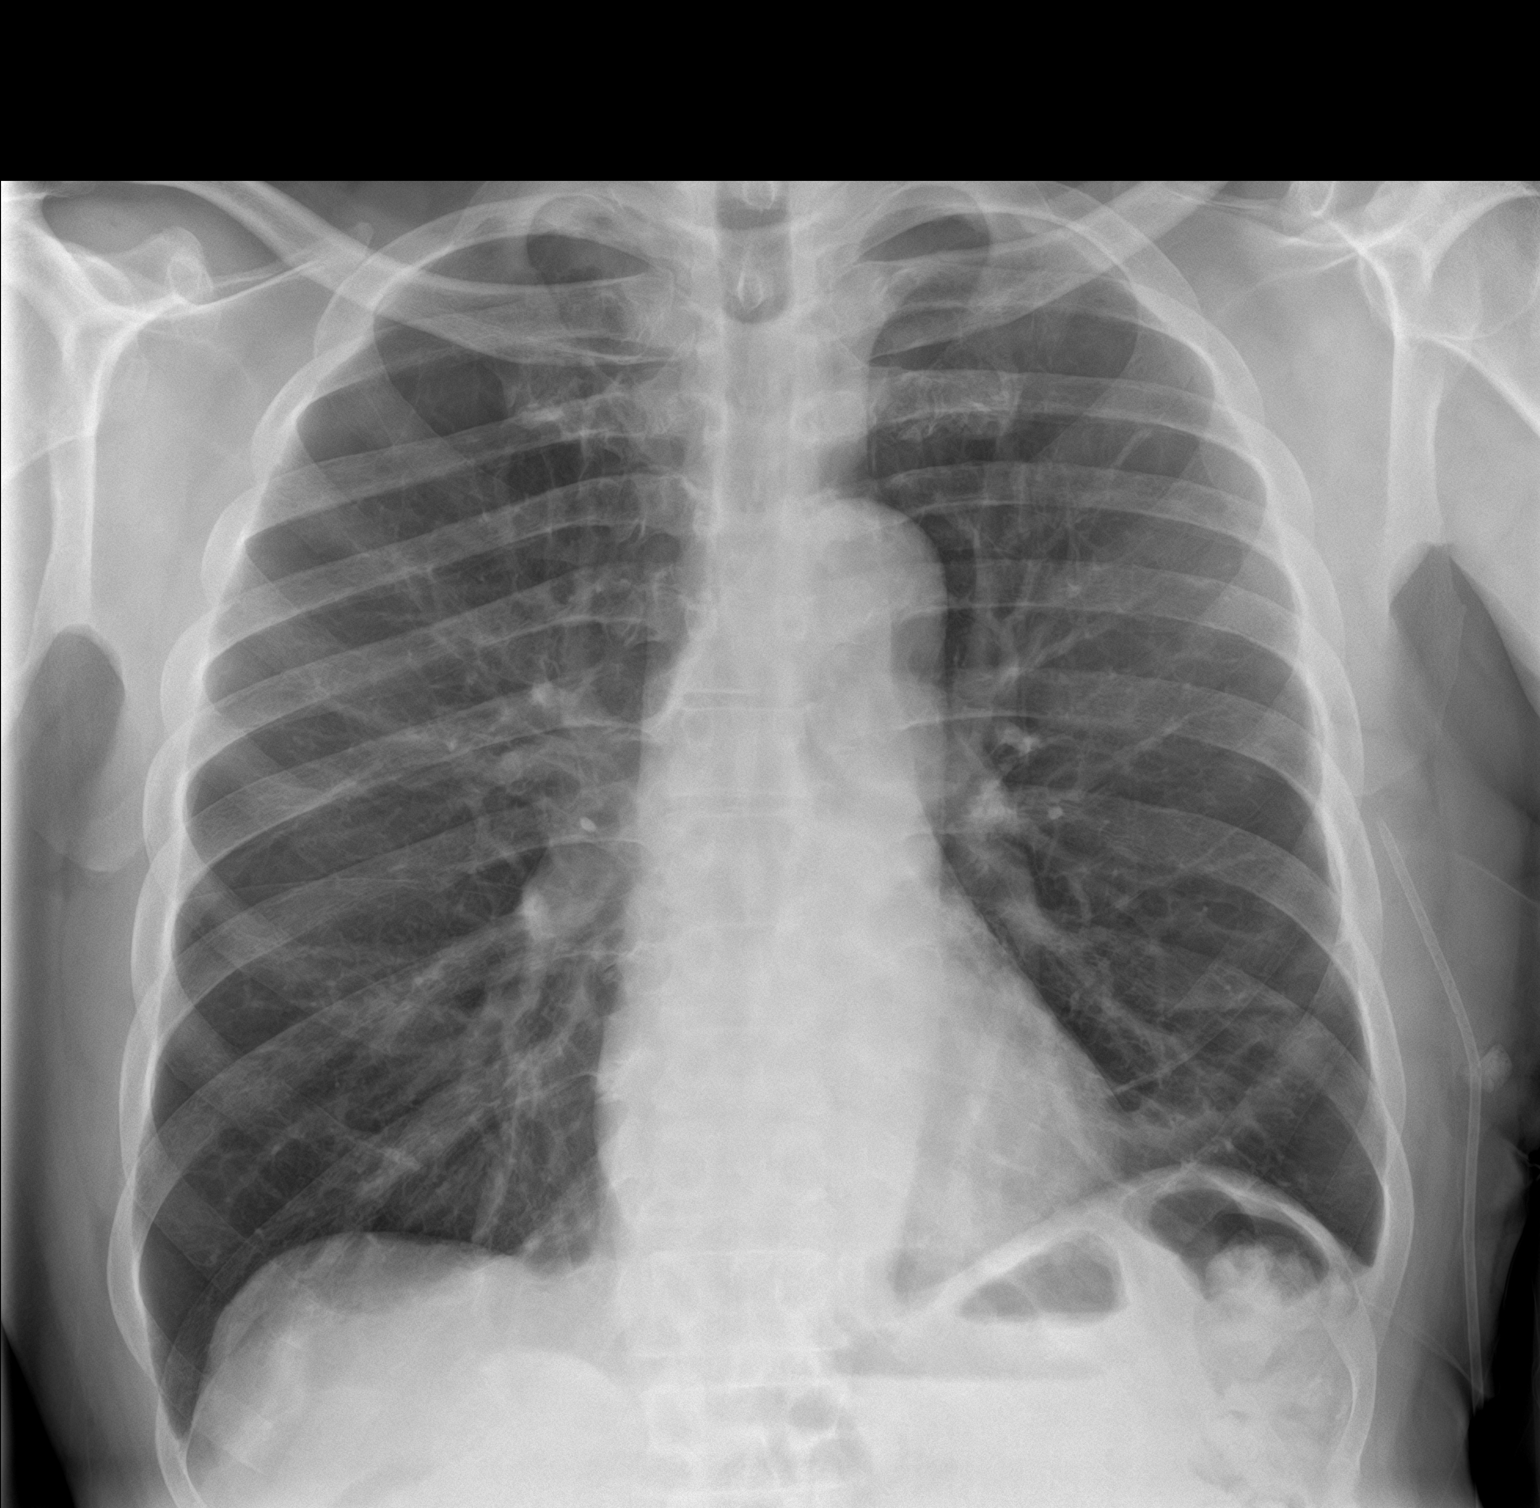

[chest lat]
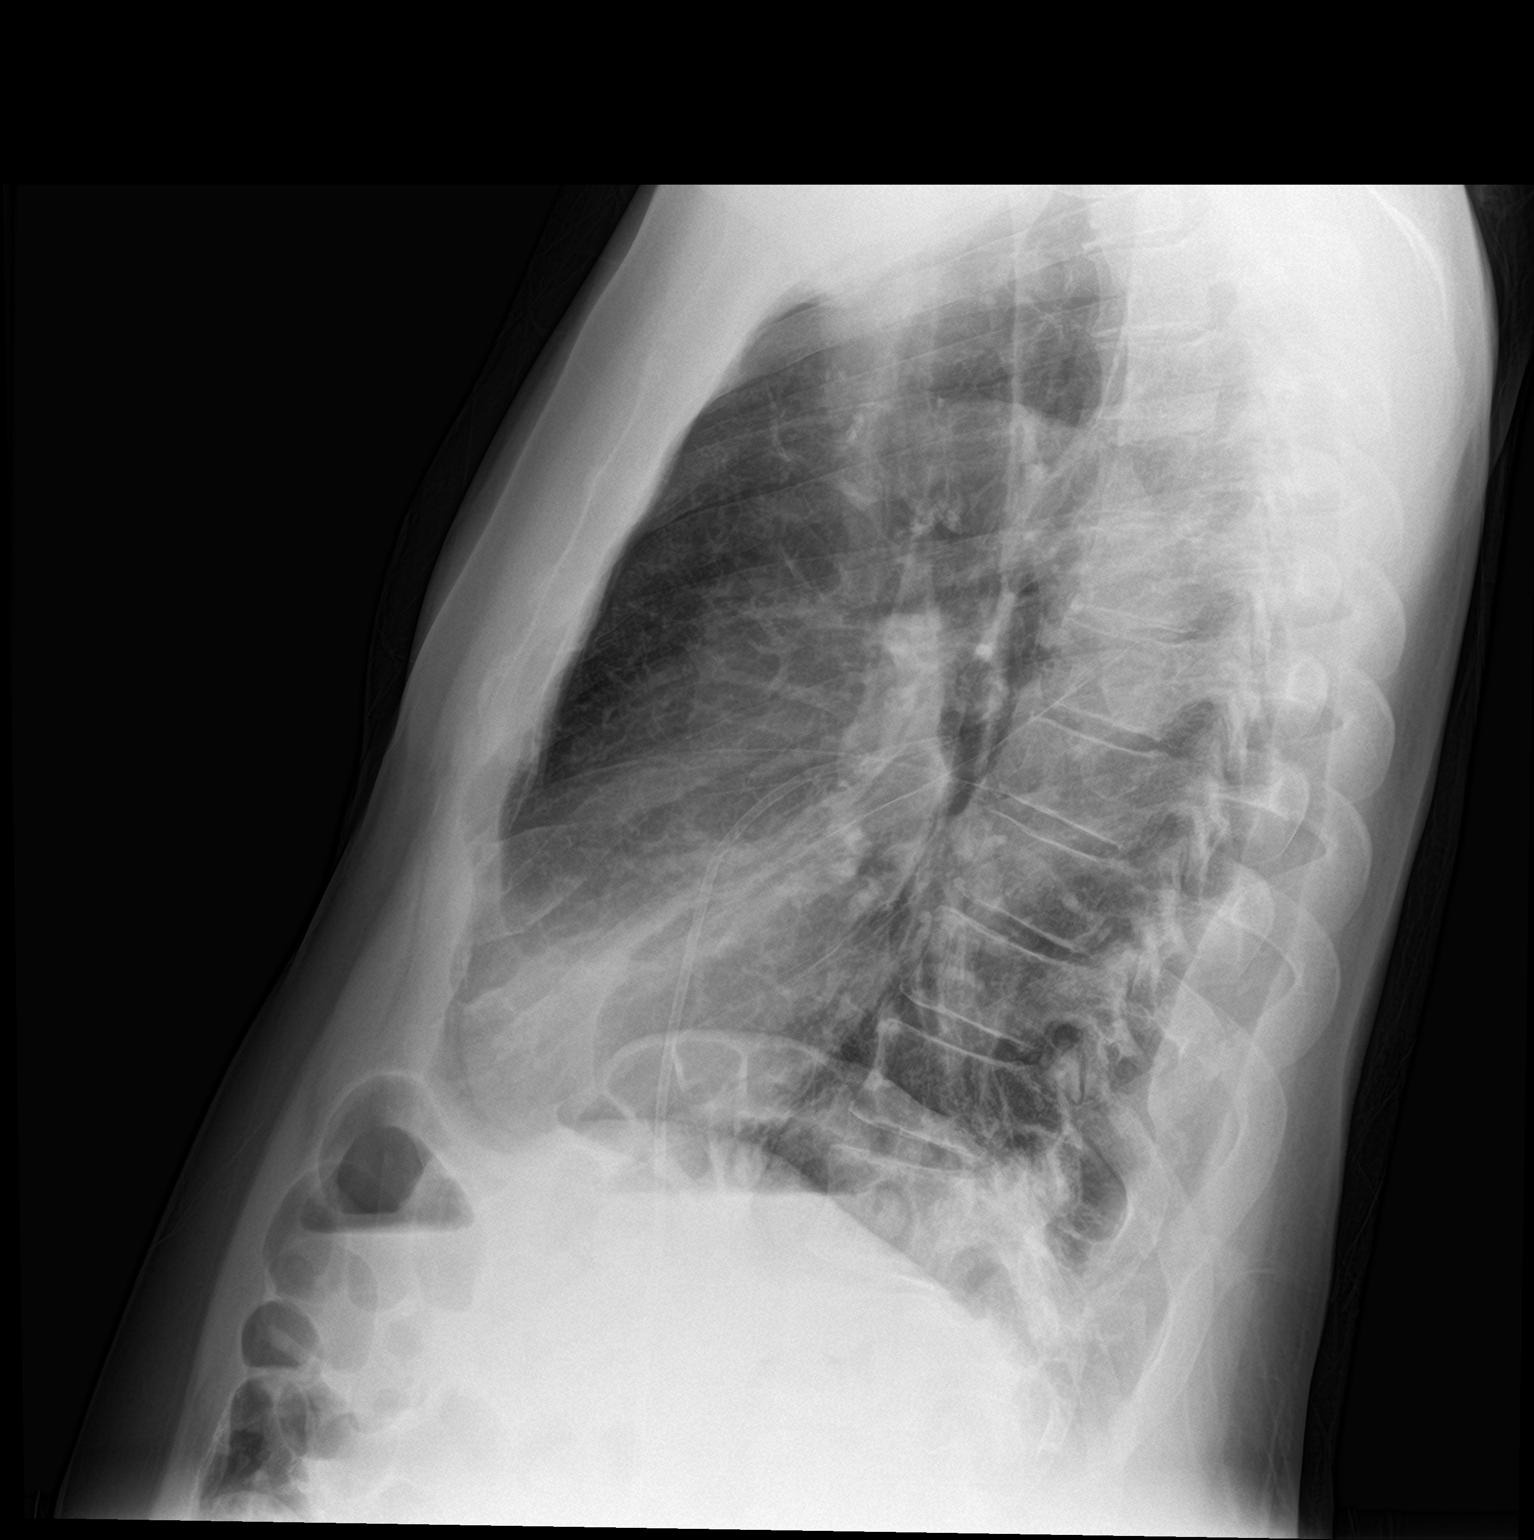

[2 of 2 positions shown; findings below may reference images not displayed]

FINDINGS: The tip of previously noted left-sided chest tube is no longer
within the pleural space and projects within the soft tissues of the
left lateral chest wall. No progression in small left medial apical
pneumothorax. Mild pleural thickening noted at the left lung apex
hand along the periphery of the left hemithorax. Left basilar
atelectasis is identified. No mediastinal shift is noted.

Normal heart size. Mild aortic atherosclerosis without aneurysmal
dilatation. No acute osseous abnormality.
IMPRESSION: 1. The left-sided chest tube is no longer within the pleural space
and projects within the soft tissues of the left lateral chest wall.
These results were called by telephone at the time of interpretation
on 02/26/2018 at [DATE] to Nurse Jumper, who verbally acknowledged
these results. Ordering clinician was already aware per nurse and
the chest tube has been removed/pulled in the interim.
2. No significant change in small medial apical pneumothorax on the
left.

## 2020-06-16 ENCOUNTER — Other Ambulatory Visit: Payer: Self-pay | Admitting: *Deleted

## 2020-06-16 MED ORDER — CLOTRIMAZOLE-BETAMETHASONE 1-0.05 % EX CREA: 1.0000 "application " | TOPICAL_CREAM | Freq: Two times a day (BID) | CUTANEOUS | 6 refills | Status: DC | PRN

## 2020-06-18 IMAGING — CR DG CHEST 2V
1 series · 2 of 2 positions shown · non-contrast
Comparison: 03/10/2018

CLINICAL DATA: Follow-up left pneumothorax

EXAM:
CHEST - 2 VIEW

[Series 1: dg chest 2 view · 0.14mm/px · 2 of 2 slices shown]
[im 1/2]
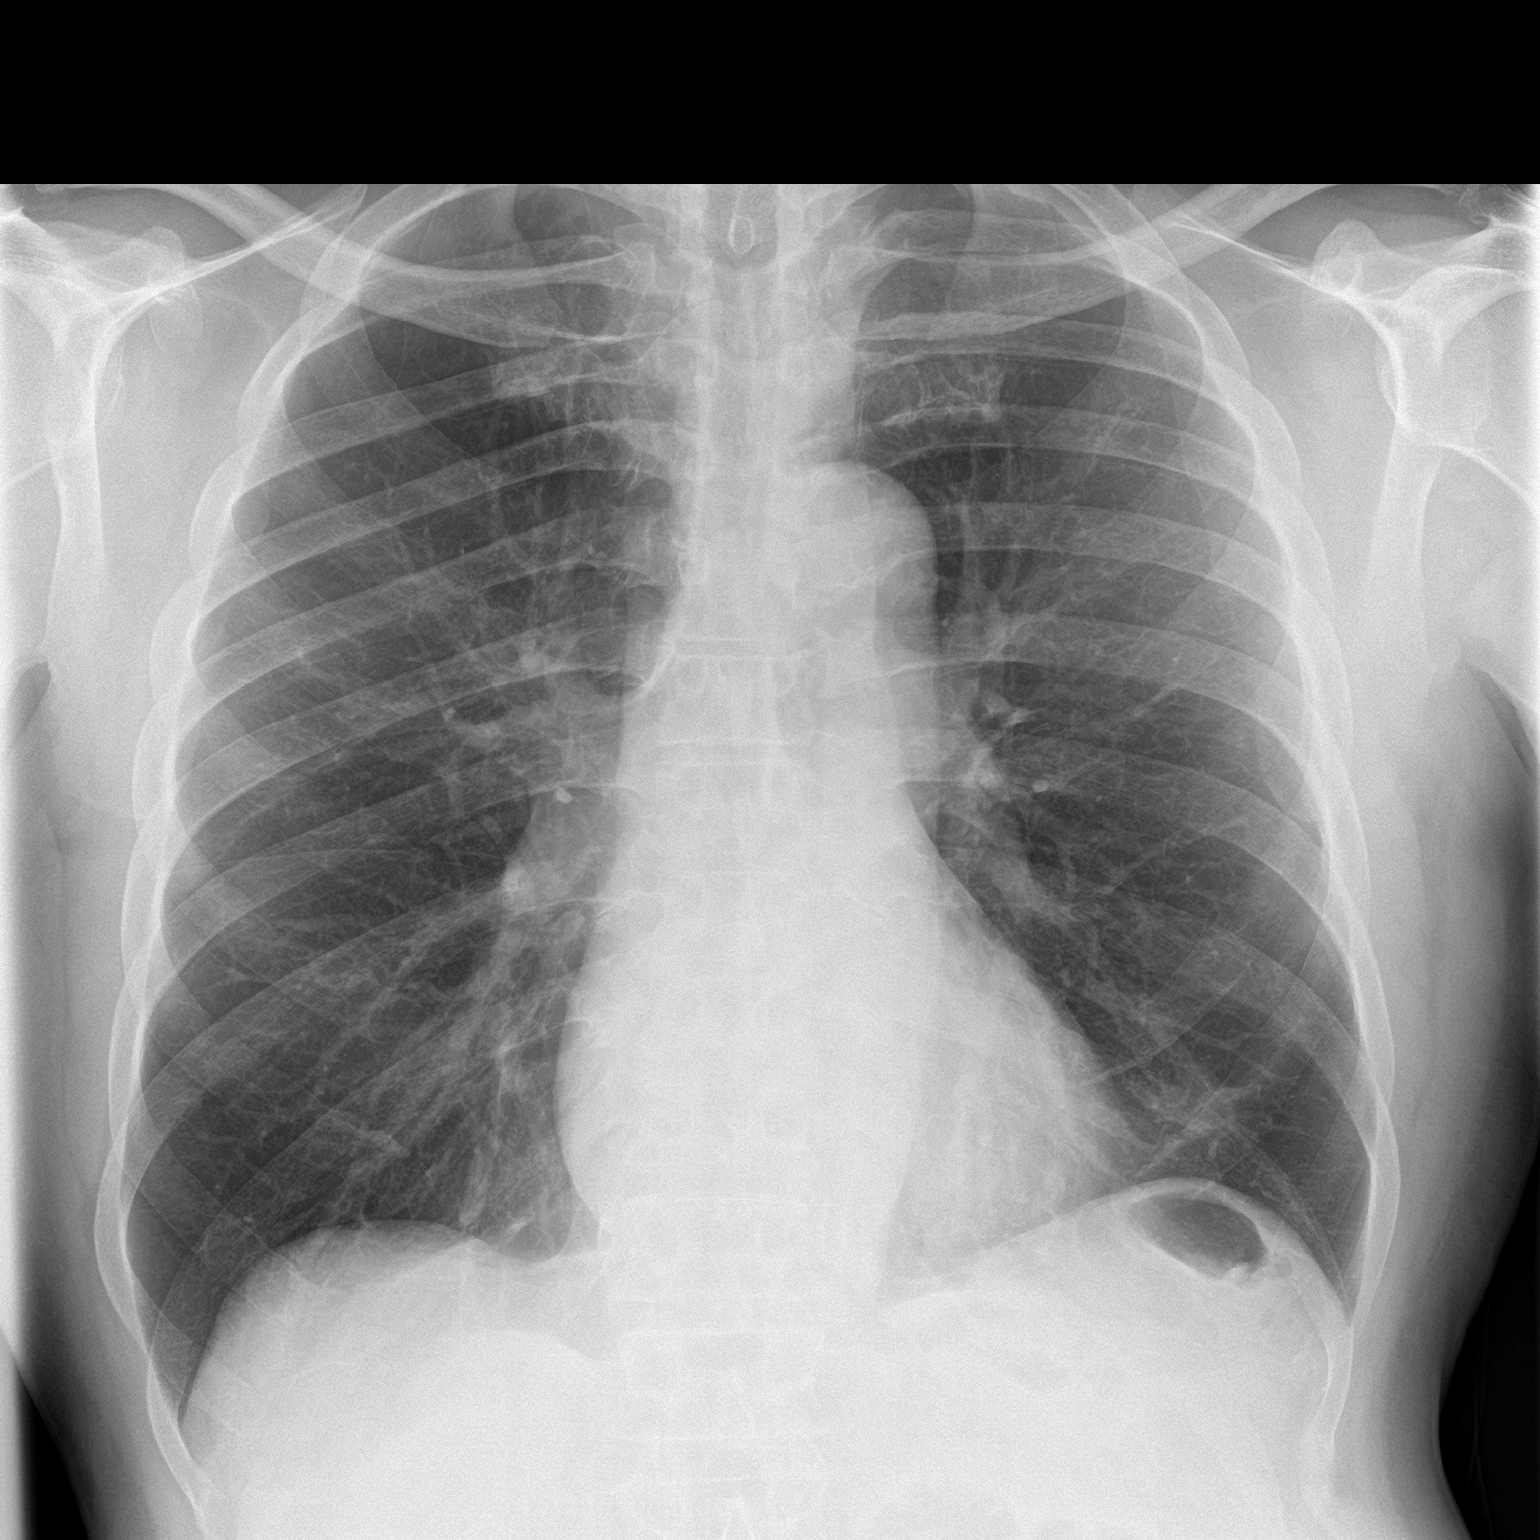
[im 2/2]
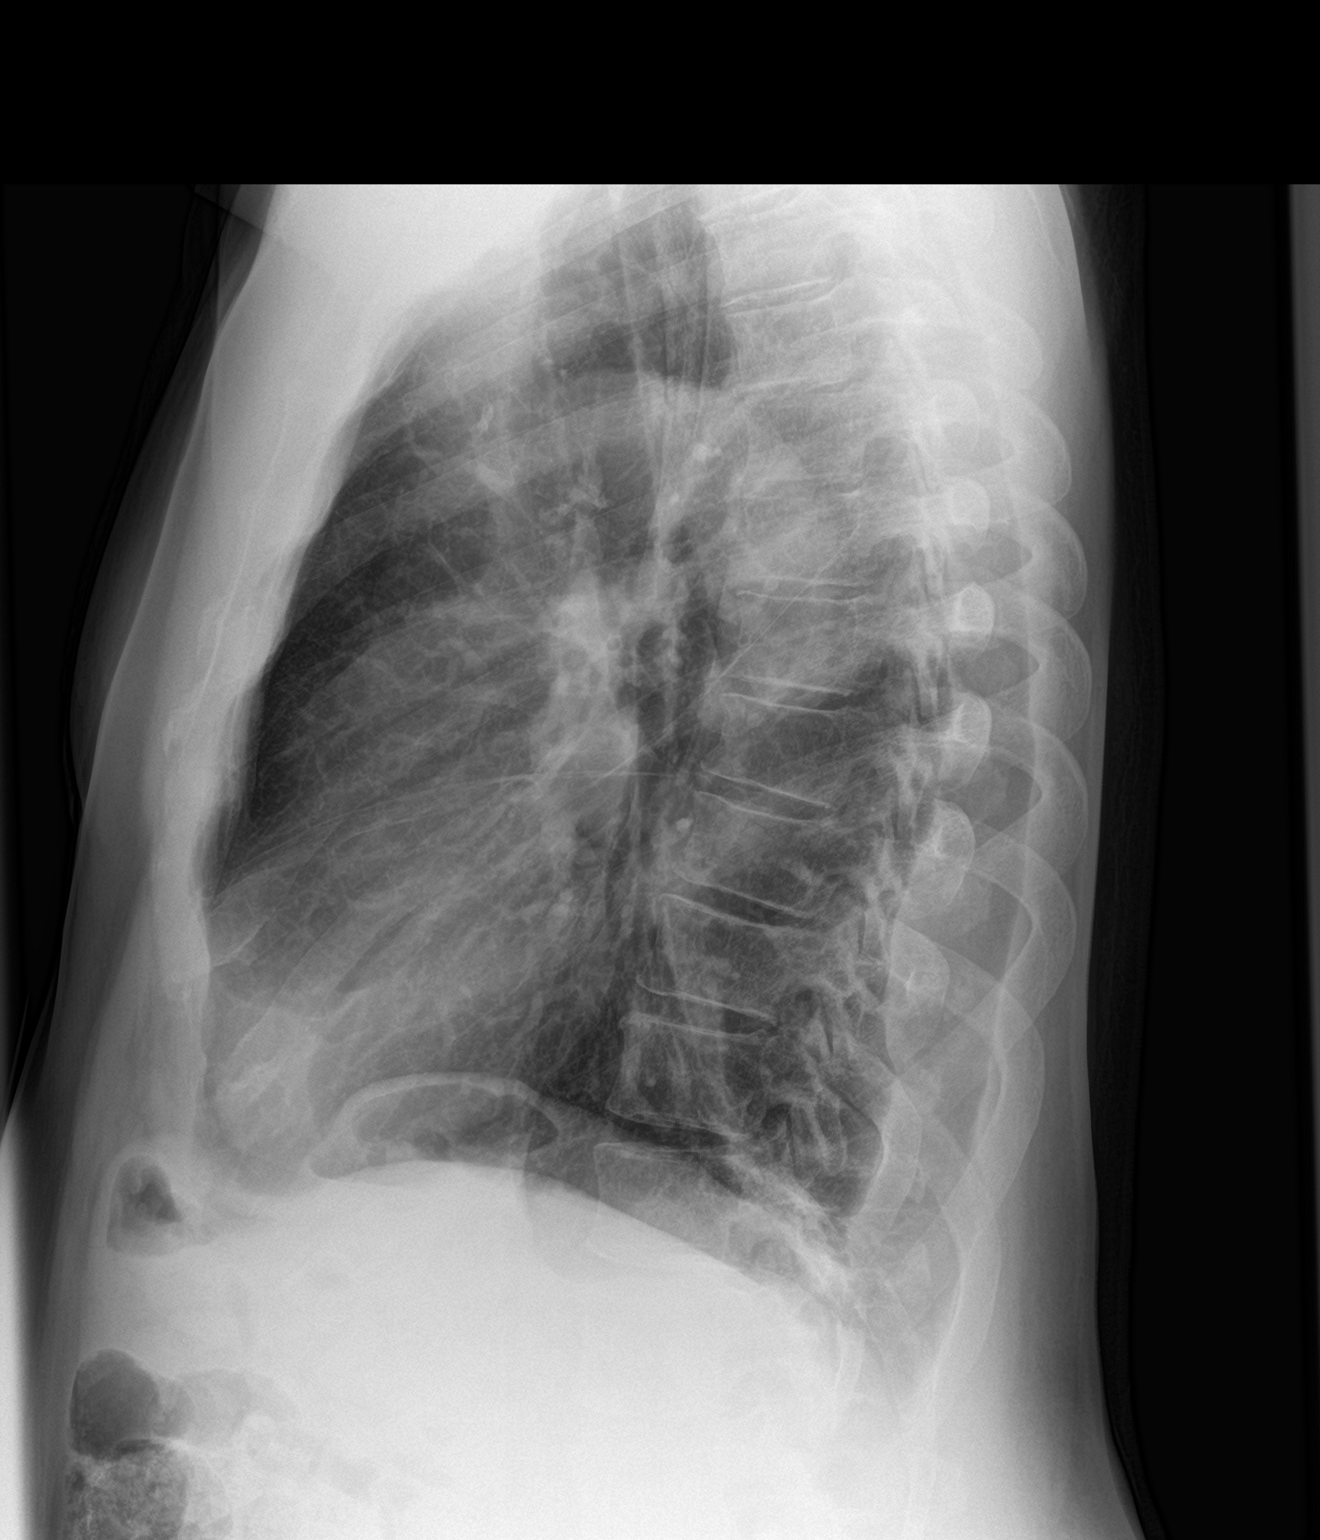

[2 of 2 positions shown; findings below may reference images not displayed]

FINDINGS: No visible residual left pneumothorax. Lungs clear. Heart is normal
size. No effusions or acute bony abnormality.
IMPRESSION: Resolution of the left pneumothorax.  No active disease.

## 2020-06-27 DIAGNOSIS — J939 Pneumothorax, unspecified: Secondary | ICD-10-CM | POA: Diagnosis not present

## 2020-06-27 DIAGNOSIS — J9383 Other pneumothorax: Secondary | ICD-10-CM | POA: Diagnosis not present

## 2020-06-27 DIAGNOSIS — J449 Chronic obstructive pulmonary disease, unspecified: Secondary | ICD-10-CM | POA: Diagnosis not present

## 2020-06-28 DIAGNOSIS — J9383 Other pneumothorax: Secondary | ICD-10-CM | POA: Diagnosis not present

## 2020-06-28 DIAGNOSIS — J449 Chronic obstructive pulmonary disease, unspecified: Secondary | ICD-10-CM | POA: Diagnosis not present

## 2020-06-28 DIAGNOSIS — J939 Pneumothorax, unspecified: Secondary | ICD-10-CM | POA: Diagnosis not present

## 2020-07-14 ENCOUNTER — Other Ambulatory Visit: Payer: Self-pay | Admitting: Internal Medicine

## 2020-07-25 DIAGNOSIS — J939 Pneumothorax, unspecified: Secondary | ICD-10-CM | POA: Diagnosis not present

## 2020-07-25 DIAGNOSIS — J9383 Other pneumothorax: Secondary | ICD-10-CM | POA: Diagnosis not present

## 2020-07-25 DIAGNOSIS — J449 Chronic obstructive pulmonary disease, unspecified: Secondary | ICD-10-CM | POA: Diagnosis not present

## 2020-08-10 DIAGNOSIS — H401131 Primary open-angle glaucoma, bilateral, mild stage: Secondary | ICD-10-CM | POA: Diagnosis not present

## 2020-08-26 ENCOUNTER — Other Ambulatory Visit: Payer: Self-pay | Admitting: Internal Medicine

## 2020-08-26 DIAGNOSIS — H2513 Age-related nuclear cataract, bilateral: Secondary | ICD-10-CM | POA: Diagnosis not present

## 2020-09-30 DIAGNOSIS — H401132 Primary open-angle glaucoma, bilateral, moderate stage: Secondary | ICD-10-CM | POA: Diagnosis not present

## 2020-10-07 DIAGNOSIS — Z23 Encounter for immunization: Secondary | ICD-10-CM | POA: Diagnosis not present

## 2020-12-28 ENCOUNTER — Other Ambulatory Visit: Payer: Self-pay

## 2020-12-28 ENCOUNTER — Other Ambulatory Visit
Admission: RE | Admit: 2020-12-28 | Discharge: 2020-12-28 | Disposition: A | Discharge status: Home / Self Care | Payer: PPO | Source: Ambulatory Visit | Attending: Pulmonary Disease | Admitting: Pulmonary Disease

## 2020-12-28 ENCOUNTER — Encounter: Payer: Self-pay | Admitting: Pulmonary Disease

## 2020-12-28 ENCOUNTER — Ambulatory Visit: Payer: PPO | Admitting: Pulmonary Disease

## 2020-12-28 VITALS — BP 150/110 | HR 75 | Temp 97.5°F | Ht 76.0 in | Wt 204.2 lb

## 2020-12-28 DIAGNOSIS — J9383 Other pneumothorax: Secondary | ICD-10-CM | POA: Diagnosis not present

## 2020-12-28 DIAGNOSIS — G4736 Sleep related hypoventilation in conditions classified elsewhere: Secondary | ICD-10-CM | POA: Diagnosis not present

## 2020-12-28 DIAGNOSIS — J439 Emphysema, unspecified: Secondary | ICD-10-CM

## 2020-12-28 DIAGNOSIS — J449 Chronic obstructive pulmonary disease, unspecified: Secondary | ICD-10-CM

## 2020-12-28 DIAGNOSIS — Z01812 Encounter for preprocedural laboratory examination: Secondary | ICD-10-CM | POA: Insufficient documentation

## 2020-12-28 DIAGNOSIS — Z20822 Contact with and (suspected) exposure to covid-19: Secondary | ICD-10-CM | POA: Diagnosis not present

## 2020-12-28 LAB — SARS CORONAVIRUS 2 (TAT 6-24 HRS): SARS Coronavirus 2: NEGATIVE

## 2020-12-28 MED ORDER — ALBUTEROL SULFATE HFA 108 (90 BASE) MCG/ACT IN AERS: 2.0000 | INHALATION_SPRAY | RESPIRATORY_TRACT | 2 refills | Status: DC | PRN

## 2020-12-28 MED ORDER — TRELEGY ELLIPTA 100-62.5-25 MCG/INH IN AEPB: 1.0000 | INHALATION_SPRAY | Freq: Every day | RESPIRATORY_TRACT | 0 refills | Status: DC

## 2020-12-28 NOTE — Patient Instructions (Signed)
We are getting some breathing test to see where your lung function is at.  You did well with your oxygen today while walking.  Take your blood pressure medication.  We are also enrolling you in the lung cancer screening program this is covered by Medicare.  We are switching your Breo to Trelegy Ellipta 1 inhalation daily.  Make sure you rinse your mouth well after you use it.  This is not an as needed inhaler.  You need to use it daily.  We also sent a prescription for an emergency inhaler.  We will see him in follow-up in 3 months time call sooner should any new problems arise.

## 2020-12-28 NOTE — Progress Notes (Signed)
Subjective:    Patient ID: Stephen Madden, male    DOB: October 27, 1950, 70 y.o.   MRN: [id number] Chief Complaint  Patient presents with   Follow-up    Patient feels good overall, no concerns at this time.    HPI Patient is a 70 year old former smoker (quit 2018, 30 PY) who presents for follow-up on the issue of COPD he was previously followed by Dr. Ashby Dawes last seen by Dr. Ashby Dawes on 16 December 2018 for a history of bilateral apical bullous emphysema status post recurrent left spontaneous pneumothorax with pleurodesis in December 2019.  Seen by our nurse practitioner Geraldo Pitter in August 2021.  He was lost to follow-up until today.  He has been on [employer] for his COPD.  Has never had PFTs presumably for fear of a pneumothorax during the procedure.  I think that this is a low probability.  He states that he feels "lazy" his exercise capacity has decreased due to dyspnea on exertion.  He uses oxygen on a as needed basis when he has dyspnea given today.  He is on nocturnal oxygen and has been compliant with that.  He has no rescue inhaler.  He states rescue inhalers are too expensive.  He does not endorse any increasing shortness of breath over baseline.  No chest pain, paroxysmal nocturnal dyspnea or lower extremity edema.  No calf tenderness. No fevers, chills or sweats.  No cough or sputum production no hemoptysis.  Review of Systems A 10 point review of systems was performed and it is as noted above otherwise negative.  Patient Active Problem List   Diagnosis Date Noted   COPD (chronic obstructive pulmonary disease) ([place]) 12/21/2019   Nocturnal hypoxia 12/21/2019   Recurrent spontaneous pneumothorax 05/12/2018   Pneumothorax on left 02/24/2018   Hx of colonic polyps    Benign neoplasm of rectosigmoid junction    Polyp of sigmoid colon    History of elevated PSA 08/11/2015   Erectile dysfunction of organic origin 08/11/2015   Acute kidney injury ([place])    S/P partial  colectomy 07/11/2015   Health care maintenance    Benign neoplasm of transverse colon    Benign neoplasm of descending colon    Benign neoplasm of sigmoid colon    Rectal polyp    Elevated PSA 01/10/2015   BPH with obstruction/lower urinary tract symptoms 01/10/2015   Social History   Tobacco Use   Smoking status: Former    Packs/day: 1.00    Years: 30.00    Pack years: 30.00    Types: Cigarettes    Quit date: 11/19/2016    Years since quitting: 4.1   Smokeless tobacco: Never  Substance Use Topics   Alcohol use: Not Currently    Alcohol/week: 0.0 standard drinks    Comment: some during week   No Known Allergies Current Meds  Medication Sig   albuterol (PROVENTIL HFA;VENTOLIN HFA) 108 (90 Base) MCG/ACT inhaler Inhale 2 puffs into the lungs every 4 (four) hours as needed for wheezing or shortness of breath.    atenolol (TENORMIN) 50 MG tablet TAKE 1 TABLET BY MOUTH EVERY DAY   chlorpheniramine (CHLOR-TRIMETON) 4 MG tablet Take 4 mg by mouth 2 (two) times daily as needed for allergies.   clotrimazole-betamethasone (LOTRISONE) cream Apply 1 application topically 2 (two) times daily as needed (for irritation).   fluticasone furoate-vilanterol (BREO ELLIPTA) 200-25 MCG/INH AEPB Inhale 1 puff into the lungs daily as needed (for shortness of breath or wheezing).  latanoprost (XALATAN) 0.005 % ophthalmic solution Place 1 drop into both eyes at bedtime.   NIFEdipine (ADALAT CC) 30 MG 24 hr tablet TAKE 1 TABLET BY MOUTH EVERY MORNING   Immunization History  Administered Date(s) Administered   Influenza, High Dose Seasonal PF 06/12/2017   Influenza-Unspecified 03/21/2018   Moderna Sars-Covid-2 Vaccination 06/10/2018, 07/09/2018      Objective:   Physical Exam BP (!) 150/110 (BP Location: Left Arm, Patient Position: Sitting, Cuff Size: Normal) Comment: has not taken BP meds yet  Pulse 75   Temp (!) 97.5 F (36.4 C) (Oral)   Ht '6\' 4"'$  (1.93 m)   Wt 204 lb 3.2 oz (92.6 kg)   SpO2  95%   BMI 24.86 kg/m  GENERAL: Well-developed, well-nourished gentleman.  No acute distress.  Fully ambulatory, no conversational dyspnea. HEAD: Normocephalic, atraumatic.  EYES: Pupils equal, round, reactive to light.  No scleral icterus.  MOUTH: Nose/mouth/throat not examined due to masking requirements for COVID 19. NECK: Supple. No thyromegaly. Trachea midline. No JVD.  No adenopathy. PULMONARY: Good air entry bilaterally.  No adventitious sounds. CARDIOVASCULAR: S1 and S2. Regular rate and rhythm.  No rubs, murmurs or gallops heard. ABDOMEN: Benign. MUSCULOSKELETAL: No joint deformity, no clubbing, no edema.  NEUROLOGIC: No focal deficit, no gait disturbance, speech is fluent. SKIN: Intact,warm,dry. PSYCH: Mood and behavior normal.   Ambulatory oximetry was performed today: This showed at rest the patient had 100% O2 sats on room air resting heart rate of 72 during ambulation maximum heart rate was 109 O2 nadir was 93.  Patient walked at an average pace and had no shortness of breath and completed 3 laps around the office.  Close to 1000 feet.    Assessment & Plan:     ICD-10-CM   1. Chronic obstructive pulmonary disease, unspecified COPD type ([place])  J44.9 Pulmonary Function Test [facility] Only    Ambulatory Referral for Lung Cancer Scre   Needs PFTs Trial of Trelegy Ellipta Albuterol for as needed use    2. Recurrent spontaneous pneumothorax  J93.83    Status post pleurodesis on left     3. Nocturnal hypoxemia due to emphysema [facility])  J43.9    G47.36    Patient states compliance with oxygen nocturnally     Meds ordered this encounter  Medications   Fluticasone-Umeclidin-Vilant (TRELEGY ELLIPTA) 100-62.5-25 MCG/INH AEPB    Sig: Inhale 1 puff into the lungs daily.    Dispense:  28 each    Refill:  0    Order Specific Question:   Lot Number?    Answer:   [id number]    Order Specific Question:   Expiration Date?    Answer:   06/21/2022    Order Specific Question:   Manufacturer?     Answer:   [employer] [[id number]]    Order Specific Question:   Quantity    Answer:   2   albuterol (VENTOLIN HFA) 108 (90 Base) MCG/ACT inhaler    Sig: Inhale 2 puffs into the lungs every 4 (four) hours as needed for wheezing or shortness of breath.    Dispense:  8 g    Refill:  2   Orders Placed This Encounter  Procedures   Ambulatory Referral for Lung Cancer Scre    Referral Priority:   Routine    Referral Type:   Consultation    Referral Reason:   Specialty Services Required    Number of Visits Requested:   1   Pulmonary Function Test [facility] Only  Standing Status:   Future    Number of Occurrences:   1    Standing Expiration Date:   12/28/2021    Order Specific Question:   Full PFT: includes the following: basic spirometry, spirometry pre & post bronchodilator, diffusion capacity (DLCO), lung volumes    Answer:   Full PFT   Patient needs PFTs to assess lung function.  We will obtain these.  We will give him a trial of Trelegy Ellipta.  He understands to discontinue Breo while on the Trelegy.  We will provide him also with rescue inhaler.  We will see the patient in follow-up in 3 months time he is to contact us prior to that time should any new difficulties arise.  Renold Don, MD Advanced Bronchoscopy P[facility]    *This note was dictated using voice recognition software/Dragon.  Despite best efforts to proofread, errors can occur which can change the meaning. Any transcriptional errors that result from this process are unintentional and may not be fully corrected at the time of dictation.

## 2020-12-29 ENCOUNTER — Telehealth: Payer: Self-pay

## 2020-12-29 ENCOUNTER — Ambulatory Visit: Payer: PPO | Attending: Pulmonary Disease

## 2020-12-29 DIAGNOSIS — J449 Chronic obstructive pulmonary disease, unspecified: Secondary | ICD-10-CM | POA: Insufficient documentation

## 2020-12-29 DIAGNOSIS — Z87891 Personal history of nicotine dependence: Secondary | ICD-10-CM | POA: Diagnosis not present

## 2020-12-29 MED ORDER — ALBUTEROL SULFATE (2.5 MG/3ML) 0.083% IN NEBU
2.5000 mg | INHALATION_SOLUTION | Freq: Once | RESPIRATORY_TRACT | Status: AC
  Administered 2020-12-29: 2.5 mg via RESPIRATORY_TRACT
  Filled 2020-12-29: qty 3

## 2020-12-29 NOTE — Telephone Encounter (Signed)
Patient has already completed covid testing for pft, nothing further needed.

## 2021-01-17 ENCOUNTER — Telehealth: Payer: Self-pay | Admitting: Pulmonary Disease

## 2021-01-17 NOTE — Telephone Encounter (Signed)
As long as the allergy pills are not mixed with decongestant it should be okay to take with Trelegy.  MAJOR is just a brand of generic tablets.  They make different allergy preparations and it would be good if he could tell us which one it is that he is taking.

## 2021-01-17 NOTE — Telephone Encounter (Signed)
Patient is aware of below message and voiced his understanding.  Noting further needed at this time.

## 2021-01-17 NOTE — Telephone Encounter (Signed)
Spoke to patient, who stated that he started taking major allergy pill. He is questioning if this is safe to take with trelegy.  Dr. Patsey Berthold, please advise. thanks

## 2021-01-20 ENCOUNTER — Telehealth: Payer: Self-pay | Admitting: Pulmonary Disease

## 2021-01-20 MED ORDER — TRELEGY ELLIPTA 100-62.5-25 MCG/INH IN AEPB: 200.0000 ug | INHALATION_SPRAY | Freq: Every day | RESPIRATORY_TRACT | 5 refills | Status: DC

## 2021-01-20 NOTE — Telephone Encounter (Signed)
Called and spoke to pt rx for Trelegy 100 sent in to patient's preferred pharmacy nothing further needed.

## 2021-01-26 MED ORDER — TRELEGY ELLIPTA 100-62.5-25 MCG/INH IN AEPB: 1.0000 | INHALATION_SPRAY | Freq: Every day | RESPIRATORY_TRACT | 11 refills | Status: DC

## 2021-01-26 NOTE — Telephone Encounter (Signed)
Rx for Trelegy has been sent to preferred pharmacy. Patient is aware and voiced his understanding.  Nothing further needed at this time.

## 2021-01-26 NOTE — Telephone Encounter (Signed)
[facility] states [facility] not [facility] for Tre[facility]y. Pharmacy is [facility]. Would like sample of Trelegy today. Patient phone number is [phone].

## 2021-03-03 DIAGNOSIS — H401132 Primary open-angle glaucoma, bilateral, moderate stage: Secondary | ICD-10-CM | POA: Diagnosis not present

## 2021-03-09 ENCOUNTER — Inpatient Hospital Stay: Payer: PPO | Attending: Radiation Oncology

## 2021-03-09 ENCOUNTER — Other Ambulatory Visit: Payer: Self-pay

## 2021-03-09 DIAGNOSIS — Z923 Personal history of irradiation: Secondary | ICD-10-CM | POA: Insufficient documentation

## 2021-03-09 DIAGNOSIS — C61 Malignant neoplasm of prostate: Secondary | ICD-10-CM | POA: Insufficient documentation

## 2021-03-09 LAB — PSA: Prostatic Specific Antigen: 0.79 ng/mL (ref 0.00–4.00)

## 2021-03-15 ENCOUNTER — Other Ambulatory Visit: Payer: Self-pay | Admitting: *Deleted

## 2021-03-15 MED ORDER — NIFEDIPINE ER 30 MG PO TB24: 30.0000 mg | ORAL_TABLET | Freq: Every morning | ORAL | 2 refills | Status: DC

## 2021-03-17 ENCOUNTER — Other Ambulatory Visit: Payer: Self-pay

## 2021-03-17 ENCOUNTER — Ambulatory Visit
Admission: RE | Admit: 2021-03-17 | Discharge: 2021-03-17 | Disposition: A | Discharge status: Home / Self Care | Payer: PPO | Source: Ambulatory Visit | Attending: Radiation Oncology | Admitting: Radiation Oncology

## 2021-03-17 VITALS — BP 161/104 | HR 80 | Wt 207.2 lb

## 2021-03-17 DIAGNOSIS — C61 Malignant neoplasm of prostate: Secondary | ICD-10-CM | POA: Insufficient documentation

## 2021-03-17 DIAGNOSIS — Z923 Personal history of irradiation: Secondary | ICD-10-CM | POA: Insufficient documentation

## 2021-03-17 DIAGNOSIS — Z08 Encounter for follow-up examination after completed treatment for malignant neoplasm: Secondary | ICD-10-CM | POA: Diagnosis not present

## 2021-03-17 NOTE — Progress Notes (Signed)
Radiation Oncology Follow up Note  Name: Stephen Madden   Date:   03/17/2021 MRN:  [id number] DOB: July 30, 1950    This 70 y.o. male presents to the clinic today for 2-year follow-up status post IMRT radiation therapy to his prostate for Gleason 6 adenocarcinoma.Marland Kitchen  REFERRING PROVIDER: Cletis Athens, MD  HPI: Patient is a 70 year old male now out 2 years having completed IMRT image guided radiation therapy to his prostate for a Gleason 6 (3+3) adenocarcinoma.  Seen today in routine follow-up he is doing well.  He specifically denies any increased lower urinary tract symptoms diarrhea or fatigue.Marland Kitchen  His most recent PSA continues to decline is 0.79 down from 0.96 a year ago.  COMPLICATIONS OF TREATMENT: none  FOLLOW UP COMPLIANCE: keeps appointments   PHYSICAL EXAM:  BP (!) 161/104   Pulse 80   Wt 207 lb 3.2 oz (94 kg)   BMI 25.22 kg/m  Well-developed well-nourished patient in NAD. HEENT reveals PERLA, EOMI, discs not visualized.  Oral cavity is clear. No oral mucosal lesions are identified. Neck is clear without evidence of cervical or supraclavicular adenopathy. Lungs are clear to A&P. Cardiac examination is essentially unremarkable with regular rate and rhythm without murmur rub or thrill. Abdomen is benign with no organomegaly or masses noted. Motor sensory and DTR levels are equal and symmetric in the upper and lower extremities. Cranial nerves II through XII are grossly intact. Proprioception is intact. No peripheral adenopathy or edema is identified. No motor or sensory levels are noted. Crude visual fields are within normal range.  RADIOLOGY RESULTS: No current films for review  PLAN: Present time patient is doing well under excellent biochemical control of his prostate cancer.  And pleased with his overall progress.  He has low side effect profile.  Of asked to see him back in 1 year with a PSA.  Patient knows to call with any concerns.  I would like to take this opportunity to thank  you for allowing me to participate in the care of your patient.Noreene Filbert, MD

## 2021-04-17 ENCOUNTER — Ambulatory Visit (INDEPENDENT_AMBULATORY_CARE_PROVIDER_SITE_OTHER): Payer: PPO

## 2021-04-17 DIAGNOSIS — Z23 Encounter for immunization: Secondary | ICD-10-CM | POA: Diagnosis not present

## 2021-04-18 ENCOUNTER — Ambulatory Visit: Payer: PPO | Admitting: Internal Medicine

## 2021-04-19 DIAGNOSIS — Z23 Encounter for immunization: Secondary | ICD-10-CM | POA: Diagnosis not present

## 2021-04-19 DIAGNOSIS — Z0000 Encounter for general adult medical examination without abnormal findings: Secondary | ICD-10-CM | POA: Diagnosis not present

## 2021-04-20 ENCOUNTER — Ambulatory Visit (INDEPENDENT_AMBULATORY_CARE_PROVIDER_SITE_OTHER): Payer: PPO | Admitting: *Deleted

## 2021-04-20 VITALS — BP 146/93 | HR 74 | Temp 96.0°F

## 2021-04-20 DIAGNOSIS — Z0000 Encounter for general adult medical examination without abnormal findings: Secondary | ICD-10-CM | POA: Diagnosis not present

## 2021-04-20 NOTE — Progress Notes (Signed)
Subjective:   Stephen Madden is a 70 y.o. male who presents for an Initial Medicare Annual Wellness Visit.  I discussed the limitations of evaluation and management by telemedicine and the availability of in person appointments. Patient expressed understanding and agreed to proceed.   Visit performed using audio  Patient:home Provider:home   Review of Systems    Defer to provider Cardiac Risk Factors include: advanced age (>67men, >21 women);hypertension;male gender     Objective:    Today's Vitals   04/20/21 1048  BP: (!) 146/93  Pulse: 74  Temp: (!) 96 F (35.6 C)   There is no height or weight on file to calculate BMI.  Advanced Directives 04/20/2021 03/15/2020 09/08/2019 03/02/2019 11/24/2018 07/23/2018 07/22/2018  Does Patient Have a Medical Advance Directive? No No No No No No No  Would patient like information on creating a medical advance directive? No - Patient declined No - Patient declined No - Patient declined No - Patient declined No - Patient declined No - Patient declined No - Patient declined    Current Medications (verified) Outpatient Encounter Medications as of 04/20/2021  Medication Sig   albuterol (VENTOLIN HFA) 108 (90 Base) MCG/ACT inhaler Inhale 2 puffs into the lungs every 4 (four) hours as needed for wheezing or shortness of breath.   atenolol (TENORMIN) 50 MG tablet TAKE 1 TABLET BY MOUTH EVERY DAY   brimonidine (ALPHAGAN) 0.2 % ophthalmic solution 1 drop 2 (two) times daily.   chlorpheniramine (CHLOR-TRIMETON) 4 MG tablet Take 4 mg by mouth 2 (two) times daily as needed for allergies.   clotrimazole-betamethasone (LOTRISONE) cream Apply 1 application topically 2 (two) times daily as needed (for irritation).   dorzolamide-timolol (COSOPT) 22.3-6.8 MG/ML ophthalmic solution 1 drop 2 (two) times daily.   Fluticasone-Umeclidin-Vilant (TRELEGY ELLIPTA) 100-62.5-25 MCG/INH AEPB Inhale 1 puff into the lungs daily.   latanoprost (XALATAN) 0.005 % ophthalmic  solution Place 1 drop into both eyes at bedtime.   NIFEdipine (ADALAT CC) 30 MG 24 hr tablet Take 1 tablet (30 mg total) by mouth every morning.   No facility-administered encounter medications on file as of 04/20/2021.    Allergies (verified) Patient has no known allergies.   History: Past Medical History:  Diagnosis Date   BPH with obstruction/lower urinary tract symptoms    Cancer (HCC)    prostate   COPD (chronic obstructive pulmonary disease) (HCC)    Elevated PSA    Hypertension    Pneumothorax 02/23/2018   left   Tobacco abuse    Past Surgical History:  Procedure Laterality Date   COLON SURGERY     COLONOSCOPY WITH PROPOFOL N/A 05/30/2015   Procedure: COLONOSCOPY WITH PROPOFOL;  Surgeon: Lucilla Lame, MD;  Location: [facility];  Service: Endoscopy;  Laterality: N/A;   COLONOSCOPY WITH PROPOFOL N/A 08/28/2016   Procedure: COLONOSCOPY WITH PROPOFOL;  Surgeon: Lucilla Lame, MD;  Location: [facility];  Service: Endoscopy;  Laterality: N/A;   CYSTOSCOPY W/ RETROGRADES Bilateral 07/11/2015   Procedure: CYSTOSCOPY WITH RETROGRADE PYELOGRAM;  Surgeon: Hollice Espy, MD;  Location: [facility];  Service: Urology;  Laterality: Bilateral;   CYSTOSCOPY WITH STENT PLACEMENT Bilateral 07/11/2015   Procedure: CYSTOSCOPY WITH STENT PLACEMENT;  Surgeon: Hollice Espy, MD;  Location: [facility];  Service: Urology;  Laterality: Bilateral;   LAPAROSCOPIC PARTIAL COLECTOMY N/A 07/11/2015   Procedure: Laparoscopic right hemicolectomy;  Surgeon: Hubbard Robinson, MD;  Location: [facility];  Service: General;  Laterality: N/A;   POLYPECTOMY  05/30/2015   Procedure:  POLYPECTOMY;  Surgeon: Lucilla Lame, MD;  Location: [facility];  Service: Endoscopy;;   VIDEO ASSISTED THORACOSCOPY (VATS) W/TALC PLEUADESIS Left 05/15/2018   Procedure: VIDEO ASSISTED THORACOSCOPY (VATS) W/TALC PLEUADESIS;  Surgeon: Nestor Lewandowsky, MD;  Location: [facility];  Service: Thoracic;  Laterality: Left;   VOLUME STUDY  N/A 07/22/2018   Procedure: VOLUME STUDY;  Surgeon: Abbie Sons, MD;  Location: [facility];  Service: Urology;  Laterality: N/A;   Family History  Problem Relation Age of Onset   Heart disease Mother    Stroke Father    Cancer Neg Hx    Kidney disease Neg Hx    Prostate cancer Neg Hx    Bladder Cancer Neg Hx    Social History   Socioeconomic History   Marital status: Married    Spouse name: Stephen Madden   Number of children: Not on file   Years of education: Not on file   Highest education level: Not on file  Occupational History   Occupation: [employer]    Comment: retired  Tobacco Use   Smoking status: Former    Packs/day: 1.00    Years: 30.00    Pack years: 30.00    Types: Cigarettes    Quit date: 11/19/2016    Years since quitting: 4.4   Smokeless tobacco: Never  Vaping Use   Vaping Use: Never used  Substance and Sexual Activity   Alcohol use: Not Currently    Alcohol/week: 0.0 standard drinks    Comment: some during week   Drug use: No   Sexual activity: Yes  Other Topics Concern   Not on file  Social History Narrative   Not on file   Social Determinants of Health   Financial Resource Strain: Low Risk    Difficulty of Paying Living Expenses: Not hard at all  Food Insecurity: No Food Insecurity   Worried About Charity fundraiser in the Last Year: Never true   [place] in the Last Year: Never true  Transportation Needs: No Transportation Needs   Lack of Transportation (Medical): No   Lack of Transportation (Non-Medical): No  Physical Activity: Insufficiently Active   Days of Exercise per Week: 3 days   Minutes of Exercise per Session: 30 min  Stress: No Stress Concern Present   Feeling of Stress : Only a little  Social Connections: Moderately Integrated   Frequency of Communication with Friends and Family: More than three times a week   Frequency of Social Gatherings with Friends and Family: More than three times a week   Attends Religious  Services: Never   Marine scientist or Organizations: Yes   Attends Music therapist: More than 4 times per year   Marital Status: Married    Tobacco Counseling Counseling given: Not Answered   Clinical Intake:  Pre-visit preparation completed: Yes  Pain : No/denies pain     Nutritional Risks: None Diabetes: No  How often do you need to have someone help you when you read instructions, pamphlets, or other written materials from your doctor or pharmacy?: 1 - Never What is the last grade level you completed in school?: 12  Diabetic?No  Interpreter Needed?: No  Information entered by :: Lacretia Nicks, [place] of Daily Living In your present state of health, do you have any difficulty performing the following activities: 04/20/2021  Hearing? N  Vision? N  Difficulty concentrating or making decisions? N  Walking or climbing stairs?  N  Dressing or bathing? N  Doing errands, shopping? N  Preparing Food and eating ? N  Using the Toilet? N  In the past six months, have you accidently leaked urine? N  Do you have problems with loss of bowel control? N  Managing your Medications? N  Managing your Finances? N  Housekeeping or managing your Housekeeping? N  Some recent data might be hidden    Patient Care Team: Cletis Athens, MD as PCP - General (Internal Medicine) Noreene Filbert, MD as Radiation Oncologist (Radiation Oncology)  Indicate any recent Medical Services you may have received from other than [facility] providers in the past year (date may be approximate).     Assessment:   This is a routine wellness examination for [place].  Hearing/Vision screen No results found.  Dietary issues and exercise activities discussed: Current Exercise Habits: Home exercise routine, Type of exercise: walking, Time (Minutes): 30, Frequency (Times/Week): 3, Weekly Exercise (Minutes/Week): 90, Intensity: Mild, Exercise limited by: None identified   Goals  Addressed   None    Depression Screen PHQ 2/9 Scores 04/20/2021 04/20/2021  PHQ - 2 Score 0 0    Fall Risk Fall Risk  04/20/2021 12/15/2019 05/30/2018 05/12/2018  Falls in the past year? - 0 0 0  Comment - Emmi Telephone Survey: data to providers prior to load - -  Number falls in past yr: 0 - - 0  Injury with Fall? 0 - - 0  Risk for fall due to : No Fall Risks - - -    FALL RISK PREVENTION PERTAINING TO THE HOME:  Any stairs in or around the home? No  If so, are there any without handrails? No  Home free of loose throw rugs in walkways, pet beds, electrical cords, etc? Yes  Adequate lighting in your home to reduce risk of falls? Yes   ASSISTIVE DEVICES UTILIZED TO PREVENT FALLS:  Life alert? No  Use of a cane, walker or w/c? No  Grab bars in the bathroom? No  Shower chair or bench in shower? No  Elevated toilet seat or a handicapped toilet? No   TIMED UP AND GO:  Was the test performed? No .   Gait steady and fast with assistive device  Cognitive Function: MMSE - Mini Mental State Exam 04/20/2021  Not completed: Unable to complete     6CIT Screen 04/20/2021  What Year? 0 points  What month? 0 points  What time? 0 points  Count back from 20 0 points  Months in reverse 0 points  Repeat phrase 0 points  Total Score 0    Immunizations Immunization History  Administered Date(s) Administered   Fluad Quad(high Dose 65+) 04/17/2021   Influenza, High Dose Seasonal PF 06/12/2017   Influenza-Unspecified 03/21/2018   Moderna SARS-COV2 Booster Vaccination 03/29/2020, 10/07/2020, 04/19/2021   Moderna Sars-Covid-2 Vaccination 06/11/2019, 07/10/2019    TDAP status: Due, Education has been provided regarding the importance of this vaccine. Advised may receive this vaccine at local pharmacy or Health Dept. Aware to provide a copy of the vaccination record if obtained from local pharmacy or Health Dept. Verbalized acceptance and understanding.  Flu Vaccine status: Up to  date  Pneumococcal vaccine status: Due, Education has been provided regarding the importance of this vaccine. Advised may receive this vaccine at local pharmacy or Health Dept. Aware to provide a copy of the vaccination record if obtained from local pharmacy or Health Dept. Verbalized acceptance and understanding.  Covid-19 vaccine status: Completed vaccines  Qualifies for Shingles Vaccine? No   Zostavax completed No   Shingrix Completed?: No.    Education has been provided regarding the importance of this vaccine. Patient has been advised to call insurance company to determine out of pocket expense if they have not yet received this vaccine. Advised may also receive vaccine at local pharmacy or Health Dept. Verbalized acceptance and understanding.  Screening Tests Health Maintenance  Topic Date Due   Pneumonia Vaccine 60+ Years old (1 - PCV) Never done   Hepatitis C Screening  Never done   TETANUS/TDAP  Never done   Zoster Vaccines- Shingrix (1 of 2) 07/19/2021 (Originally 02/10/1970)   COLONOSCOPY (Pts 45-45yrs Insurance coverage will need to be confirmed)  04/20/2022 (Originally 08/29/2019)   COVID-19 Vaccine (3 - Moderna risk series) 05/17/2021   INFLUENZA VACCINE  Completed   HPV VACCINES  Aged Out    Health Maintenance  Health Maintenance Due  Topic Date Due   Pneumonia Vaccine 32+ Years old (1 - PCV) Never done   Hepatitis C Screening  Never done   TETANUS/TDAP  Never done    Colorectal cancer screening: Type of screening: Colonoscopy. Completed 08/28/2016. Repeat every 3 years Patient wishes to wait until after the holidays   Lung Cancer Screening: (Low Dose CT Chest recommended if Age 9-80 years, 30 pack-year currently smoking OR have quit w/in 15years.) does not qualify.   Lung Cancer Screening Referral: NA  Additional Screening:  Hepatitis C Screening: does qualify; Will complete with next labs   Vision Screening: Recommended annual ophthalmology exams for early  detection of glaucoma and other disorders of the eye. Is the patient up to date with their annual eye exam?  Yes  Who is the provider or what is the name of the office in which the patient attends annual eye exams? [facility]  If pt is not established with a provider, would they like to be referred to a provider to establish care?  Patient already established .   Dental Screening: Recommended annual dental exams for proper oral hygiene  Community Resource Referral / Chronic Care Management: CRR required this visit?  No   CCM required this visit?  No      Plan:     I have personally reviewed and noted the following in the patient's chart:   Medical and social history Use of alcohol, tobacco or illicit drugs  Current medications and supplements including opioid prescriptions. Patient is not currently taking opioid prescriptions. Functional ability and status Nutritional status Physical activity Advanced directives List of other physicians Hospitalizations, surgeries, and ER visits in previous 12 months Vitals Screenings to include cognitive, depression, and falls Referrals and appointments  In addition, I have reviewed and discussed with patient certain preventive protocols, quality metrics, and best practice recommendations. A written personalized care plan for preventive services as well as general preventive health recommendations were provided to patient.     Lacretia Nicks, [place]   04/20/2021   Nurse Notes: I discussed the limitations of evaluation and management by telemedicine and the availability of in person appointments. Patient expressed understanding and agreed to proceed.   Visit performed using audio  Patient:home Provider:home    Time spent 40 min

## 2021-04-21 NOTE — Progress Notes (Signed)
I have reviewed this visit and agree with the documentation.   

## 2021-05-01 ENCOUNTER — Telehealth: Payer: Self-pay | Admitting: Pulmonary Disease

## 2021-05-01 DIAGNOSIS — J449 Chronic obstructive pulmonary disease, unspecified: Secondary | ICD-10-CM

## 2021-05-01 NOTE — Telephone Encounter (Signed)
He was doing well on his most recent visit.  I recommend that we repeat overnight oximetry.  She had one last year however it does not appear that it was seen by any of the providers.

## 2021-05-01 NOTE — Telephone Encounter (Signed)
Spoke to patient, who stated that [employer] is needing documentation stating that he needs oxygen. According to our records, patient did not qualify for oxygen based off of last walk test.  He is concerned that he needs oxygen at night.   Dr. Patsey Berthold, please advise. thanks

## 2021-05-01 NOTE — Telephone Encounter (Signed)
Patient is aware of below message and voiced her understanding. ONO has been ordered.  Patient is aware and voiced his understanding.  Nothing further needed.

## 2021-05-05 NOTE — Telephone Encounter (Signed)
Pt called in requesting status of 02 at night. Pt had ONO in August, which pt qualified for 02 .  Spoke with Melissa with [employer] and she is going to check with billing to see if [employer] can use this ONO from August since pt has been on 02 at night since 2020 when d/c from Hospital by Dr. Genevive Bi.  Melissa will contact the office back on Monday.    Pt is aware of the above and will be waiting to hear back about his 02 from [place]. Rhonda J Cobb

## 2021-05-09 ENCOUNTER — Ambulatory Visit: Payer: PPO

## 2021-05-09 ENCOUNTER — Other Ambulatory Visit: Payer: Self-pay

## 2021-05-10 NOTE — Telephone Encounter (Signed)
Melissa from [facility] called me today stating that their next step was to contact [employer] and find out what their requirements are for 02 recert.    Dr. Alexis Frock also wanted to confirm whether you wanted the patient to have night time 02 only or continuous 02.  I told her ONO was order and you would probably wait for the ONO results

## 2021-05-10 NOTE — Telephone Encounter (Signed)
Routing to [place].

## 2021-05-10 NOTE — Telephone Encounter (Signed)
The request is for NIGHTTIME O2.  On ambulatory oximetry visit in August he did not have any significant desaturations, the lowest he achieved was 93%.

## 2021-05-10 NOTE — Telephone Encounter (Signed)
Dr. Gonzalez, please advise. Thanks 

## 2021-05-26 DIAGNOSIS — H401132 Primary open-angle glaucoma, bilateral, moderate stage: Secondary | ICD-10-CM | POA: Diagnosis not present

## 2021-05-26 NOTE — Telephone Encounter (Signed)
Melissa from [employer] called me back. Mr. Blahnik account has been taken out of collections and [employer] will be calling the patient about this

## 2021-06-12 ENCOUNTER — Other Ambulatory Visit: Payer: Self-pay | Admitting: *Deleted

## 2021-06-12 DIAGNOSIS — Z87891 Personal history of nicotine dependence: Secondary | ICD-10-CM

## 2021-06-16 ENCOUNTER — Other Ambulatory Visit: Payer: Self-pay | Admitting: *Deleted

## 2021-06-16 MED ORDER — ATENOLOL 50 MG PO TABS: 50.0000 mg | ORAL_TABLET | Freq: Every day | ORAL | 3 refills | Status: DC

## 2021-06-27 ENCOUNTER — Encounter: Payer: Self-pay | Admitting: Acute Care

## 2021-06-27 ENCOUNTER — Other Ambulatory Visit: Payer: Self-pay

## 2021-06-27 ENCOUNTER — Ambulatory Visit (INDEPENDENT_AMBULATORY_CARE_PROVIDER_SITE_OTHER): Payer: PPO | Admitting: Acute Care

## 2021-06-27 DIAGNOSIS — Z87891 Personal history of nicotine dependence: Secondary | ICD-10-CM

## 2021-06-27 NOTE — Patient Instructions (Signed)
Thank you for participating in the [facility] Screening [facility]. °It was our pleasure to meet you today. °We will call you with the results of your scan within the next few days. °Your scan will be assigned a Lung RADS category score by the physicians reading the scans.  °This Lung RADS score determines follow up scanning.  °See below for description of categories, and follow up screening recommendations. °We will be in touch to schedule your follow up screening annually or based on recommendations of our providers. °We will fax a copy of your scan results to your Primary Care Physician, or the physician who referred you to the program, to ensure they have the results. °Please call the office if you have any questions or concerns regarding your scanning experience or results.  °Our office number is [phone]. °Please speak with Denise Phelps, RN. She is our Lung Cancer Screening RN. °If she is unavailable when you call, please have the office staff send her a message. She will return your call at her earliest convenience. °Remember, if your scan is normal, we will scan you annually as long as you continue to meet the criteria for the program. (Age 55-77, Current smoker or smoker who has quit within the last 15 years). °If you are a smoker, remember, quitting is the single most powerful action that you can take to decrease your risk of lung cancer and other pulmonary, breathing related problems. °We know quitting is hard, and we are here to help.  °Please let us know if there is anything we can do to help you meet your goal of quitting. °If you are a former smoker, congratulations. We are proud of you! Remain smoke free! °Remember you can refer friends or family members through the number above.  °We will screen them to make sure they meet criteria for the program. °Thank you for helping us take better care of you by participating in Lung Screening. ° °You can receive free nicotine replacement therapy  ( patches, gum or mints) by calling 1-800-QUIT NOW. Please call so we can get you on the path to becoming  a non-smoker. I know it is hard, but you can do this! ° °Lung RADS Categories: ° °Lung RADS 1: no nodules or definitely non-concerning nodules.  °Recommendation is for a repeat annual scan in 12 months. ° °Lung RADS 2:  nodules that are non-concerning in appearance and behavior with a very low likelihood of becoming an active cancer. °Recommendation is for a repeat annual scan in 12 months. ° °Lung RADS 3: nodules that are probably non-concerning , includes nodules with a low likelihood of becoming an active cancer.  Recommendation is for a 6-month repeat screening scan. Often noted after an upper respiratory illness. We will be in touch to make sure you have no questions, and to schedule your 6-month scan. ° °Lung RADS 4 A: nodules with concerning findings, recommendation is most often for a follow up scan in 3 months or additional testing based on our provider's assessment of the scan. We will be in touch to make sure you have no questions and to schedule the recommended 3 month follow up scan. ° °Lung RADS 4 B:  indicates findings that are concerning. We will be in touch with you to schedule additional diagnostic testing based on our provider's  assessment of the scan. ° °Hypnosis for smoking cessation  °[employer] °[phone] ° °Acupuncture for smoking cessation  °[facility] °[phone]  °

## 2021-06-27 NOTE — Progress Notes (Signed)
Virtual Visit via Telephone Note  I connected with Stephen Madden on 06/27/21 at 10:30 AM EST by telephone and verified that I am speaking with the correct person using two identifiers.  Location: Patient: At home Provider: [place], [place], [place], [address]    I discussed the limitations, risks, security and privacy concerns of performing an evaluation and management service by telephone and the availability of in person appointments. I also discussed with the patient that there may be a patient responsible charge related to this service. The patient expressed understanding and agreed to proceed.   Shared Decision Making Visit Lung Cancer Screening Program [phone])   Eligibility: Age 71 y.o. Pack Years Smoking History Calculation 46 pack years (# packs/per year x # years smoked) Recent History of coughing up blood  no Unexplained weight loss? no ( >Than 15 pounds within the last 6 months ) Prior History Lung / other cancer no (Diagnosis within the last 5 years already requiring surveillance chest CT Scans). Smoking Status Former Smoker Former Smokers: Years since quit: 5 years  Quit Date: 2018  Visit Components: Discussion included one or more decision making aids. yes Discussion included risk/benefits of screening. yes Discussion included potential follow up diagnostic testing for abnormal scans. yes Discussion included meaning and risk of over diagnosis. yes Discussion included meaning and risk of False Positives. yes Discussion included meaning of total radiation exposure. yes  Counseling Included: Importance of adherence to annual lung cancer LDCT screening. yes Impact of comorbidities on ability to participate in the program. yes Ability and willingness to under diagnostic treatment. yes  Smoking Cessation Counseling: Current Smokers:  Discussed importance of smoking cessation. yes Information about tobacco cessation classes and interventions provided to  patient. yes Patient provided with "ticket" for LDCT Scan. yes Symptomatic Patient. no  Counseling NA Diagnosis Code: Tobacco Use Z72.0 Asymptomatic Patient yes  Counseling (Intermediate counseling: > three minutes counseling) G2836 Former Smokers:  Discussed the importance of maintaining cigarette abstinence. yes Diagnosis Code: Personal History of Nicotine Dependence. O29.476 Information about tobacco cessation classes and interventions provided to patient. Yes Patient provided with "ticket" for LDCT Scan. yes Written Order for Lung Cancer Screening with LDCT placed in Epic. Yes (CT Chest Lung Cancer Screening Low Dose W/O CM) [id number] Z12.2-Screening of respiratory organs Z87.891-Personal history of nicotine dependence  I spent 25 minutes of face to face time/virtual visit time  with Stephen Madden discussing the risks and benefits of lung cancer screening. We took the time to pause the power point at intervals to allow for questions to be asked and answered to ensure understanding. We discussed that he had taken the single most powerful action possible to decrease his risk of developing lung cancer when he quit smoking. I counseled him to remain smoke free, and to contact me if he ever had the desire to smoke again so that I can provide resources and tools to help support the effort to remain smoke free. We discussed the time and location of the scan, and that either  Doroteo Glassman RN, Joella Prince, RN or I  or I will call / send a letter with the results within  24-72 hours of receiving them. He has the office contact information in the event he needs to speak with me,  he verbalized understanding of all of the above and had no further questions upon leaving the office.     I explained to the patient that there has been a high incidence of  coronary artery disease noted on these exams. I explained that this is a non-gated exam therefore degree or severity cannot be determined. This patient is  not on statin therapy. I have asked the patient to follow-up with their PCP regarding any incidental finding of coronary artery disease and management with diet or medication as they feel is clinically indicated. The patient verbalized understanding of the above and had no further questions.     Stephen Spatz, NP

## 2021-06-28 ENCOUNTER — Ambulatory Visit
Admission: RE | Admit: 2021-06-28 | Discharge: 2021-06-28 | Disposition: A | Discharge status: Home / Self Care | Payer: PPO | Source: Ambulatory Visit | Attending: Acute Care | Admitting: Acute Care

## 2021-06-28 DIAGNOSIS — J432 Centrilobular emphysema: Secondary | ICD-10-CM | POA: Diagnosis not present

## 2021-06-28 DIAGNOSIS — Z87891 Personal history of nicotine dependence: Secondary | ICD-10-CM

## 2021-06-28 DIAGNOSIS — I2510 Atherosclerotic heart disease of native coronary artery without angina pectoris: Secondary | ICD-10-CM | POA: Diagnosis not present

## 2021-06-28 DIAGNOSIS — J929 Pleural plaque without asbestos: Secondary | ICD-10-CM | POA: Diagnosis not present

## 2021-07-03 ENCOUNTER — Encounter: Payer: Self-pay | Admitting: Pulmonary Disease

## 2021-07-03 ENCOUNTER — Other Ambulatory Visit: Payer: Self-pay

## 2021-07-03 ENCOUNTER — Ambulatory Visit: Payer: PPO | Admitting: Pulmonary Disease

## 2021-07-03 VITALS — BP 140/88 | HR 94 | Temp 97.3°F | Ht 76.0 in | Wt 210.2 lb

## 2021-07-03 DIAGNOSIS — J439 Emphysema, unspecified: Secondary | ICD-10-CM

## 2021-07-03 DIAGNOSIS — G4736 Sleep related hypoventilation in conditions classified elsewhere: Secondary | ICD-10-CM | POA: Diagnosis not present

## 2021-07-03 DIAGNOSIS — Z87891 Personal history of nicotine dependence: Secondary | ICD-10-CM | POA: Diagnosis not present

## 2021-07-03 DIAGNOSIS — J449 Chronic obstructive pulmonary disease, unspecified: Secondary | ICD-10-CM

## 2021-07-03 MED ORDER — STIOLTO RESPIMAT 2.5-2.5 MCG/ACT IN AERS: 2.5000 ug | INHALATION_SPRAY | Freq: Two times a day (BID) | RESPIRATORY_TRACT | 0 refills | Status: DC

## 2021-07-03 NOTE — Patient Instructions (Signed)
We are giving you a trial of a medication called Stiolto this is 2 puffs daily.  Let us know how you do with this so we can call it in for you.  You have trouble with the medication please to let us know.   We will see him in follow-up in 3 months time call sooner should any new problems arise.

## 2021-07-03 NOTE — Progress Notes (Signed)
Subjective:    Patient ID: Stephen Madden, male    DOB: August 04, 1950, 71 y.o.   MRN: [id number] Chief Complaint  Patient presents with   Follow-up    COPD   HPI Stephen Madden is a 71 year old former smoker (quit 2018, 44 PY) patient presents for follow-up on COPD and dyspnea.  Patient initially seen by me on 28 December 2020.  He was given a trial of Trelegy Ellipta at that time.  Did not tolerate the Trelegy well due to making his mouth feel sore.  He noted that Breo did not cause this side effect however he still was having issues with dyspnea with the [facility].  He does not have airways reactivity by PFTs.  He does have severe COPD on the basis of emphysema.  He has nocturnal hypoxemia and uses oxygen nocturnally religiously.  Occasionally he will use the oxygen during the day if he feels short of breath.  Prior ambulatory oximetry in August did not show any desaturations with exercise.  He has not had any fevers, chills or sweats.  No weight loss or anorexia.  Overall he feels that he has "good days and bad days".  Currently not on any maintenance inhaler.  Since his prior visit he has had lung cancer screening CT.  We did review the films with the patient.  Aside from his known emphysema there was also noted some coronary artery calcifications I urged the patient to discuss this with his primary care physician to determine if further work-up is warranted.  DATA 12/29/2020 PFTs: FEV1 0.98 L or 26% predicted, FVC 2.72 L or 56% predicted, FEV1/FVC 36%.  No bronchodilator response.  There is air trapping but no hyperinflation.  No high risk component.  Diffusion capacity related to severely impaired.  Consistent with severe COPD on the basis of emphysema. 06/29/2021 LDCT, lung cancer screening: Lung RADS 2S, diffuse bronchial wall thickening with moderate centrilobular and paraseptal emphysema consistent with underlying COPD.  Two-vessel coronary artery disease suggested.  Review of Systems A 10 point review of  systems was performed and it is as noted above otherwise negative.  Patient Active Problem List   Diagnosis Date Noted   COPD (chronic obstructive pulmonary disease) ([place]) 12/21/2019   Nocturnal hypoxia 12/21/2019   Recurrent spontaneous pneumothorax 05/12/2018   Pneumothorax on left 02/24/2018   Hx of colonic polyps    Benign neoplasm of rectosigmoid junction    Polyp of sigmoid colon    History of elevated PSA 08/11/2015   Erectile dysfunction of organic origin 08/11/2015   Acute kidney injury ([place])    S/P partial colectomy 07/11/2015   Health care maintenance    Benign neoplasm of transverse colon    Benign neoplasm of descending colon    Benign neoplasm of sigmoid colon    Rectal polyp    Elevated PSA 01/10/2015   BPH with obstruction/lower urinary tract symptoms 01/10/2015   Social History   Tobacco Use   Smoking status: Former    Packs/day: 1.00    Years: 30.00    Pack years: 30.00    Types: Cigarettes    Quit date: 11/19/2016    Years since quitting: 4.6   Smokeless tobacco: Never  Substance Use Topics   Alcohol use: Not Currently    Alcohol/week: 0.0 standard drinks    Comment: some during week   No Known Allergies  Current Meds  Medication Sig   albuterol (VENTOLIN HFA) 108 (90 Base) MCG/ACT inhaler Inhale 2 puffs into the  lungs every 4 (four) hours as needed for wheezing or shortness of breath.   atenolol (TENORMIN) 50 MG tablet Take 1 tablet (50 mg total) by mouth daily.   brimonidine (ALPHAGAN) 0.2 % ophthalmic solution 1 drop 2 (two) times daily.   chlorpheniramine (CHLOR-TRIMETON) 4 MG tablet Take 4 mg by mouth 2 (two) times daily as needed for allergies.   clotrimazole-betamethasone (LOTRISONE) cream Apply 1 application topically 2 (two) times daily as needed (for irritation).   dorzolamide-timolol (COSOPT) 22.3-6.8 MG/ML ophthalmic solution 1 drop 2 (two) times daily.   latanoprost (XALATAN) 0.005 % ophthalmic solution Place 1 drop into both eyes at  bedtime.   NIFEdipine (ADALAT CC) 30 MG 24 hr tablet Take 1 tablet (30 mg total) by mouth every morning.   Tiotropium Bromide-Olodaterol (STIOLTO RESPIMAT) 2.5-2.5 MCG/ACT AERS Inhale 2.5 mcg into the lungs 2 (two) times daily.   [DISCONTINUED] Fluticasone-Umeclidin-Vilant (TRELEGY ELLIPTA) 100-62.5-25 MCG/INH AEPB Inhale 1 puff into the lungs daily.   Immunization History  Administered Date(s) Administered   Fluad Quad(high Dose 65+) 04/17/2021   Influenza, High Dose Seasonal PF 06/12/2017   Influenza-Unspecified 03/21/2018   Moderna SARS-COV2 Booster Vaccination 03/29/2020, 10/07/2020, 04/19/2021   Moderna Sars-Covid-2 Vaccination 06/11/2019, 07/10/2019       Objective:   Physical Exam BP 140/88 (BP Location: Left Arm, Patient Position: Sitting, Cuff Size: Normal)    Pulse 94    Temp (!) 97.3 F (36.3 C) (Oral)    Ht 6\' 4"  (1.93 m)    Wt 210 lb 3.2 oz (95.3 kg)    SpO2 92%    BMI 25.59 kg/m  GENERAL: Well-developed, well-nourished gentleman.  No acute distress.  Fully ambulatory, no conversational dyspnea. HEAD: Normocephalic, atraumatic.  EYES: Pupils equal, round, reactive to light.  No scleral icterus.  MOUTH: Nose/mouth/throat not examined due to masking requirements for COVID 19. NECK: Supple. No thyromegaly. Trachea midline. No JVD.  No adenopathy. PULMONARY: Good air entry bilaterally.  No adventitious sounds. CARDIOVASCULAR: S1 and S2. Regular rate and rhythm.  No rubs, murmurs or gallops heard. ABDOMEN: Benign. MUSCULOSKELETAL: No joint deformity, no clubbing, no edema.  NEUROLOGIC: No focal deficit, no gait disturbance, speech is fluent. SKIN: Intact,warm,dry. PSYCH: Mood and behavior normal.   Representative image of LDCT scanning of the chest performed 28 June 2021, independently reviewed:      Assessment & Plan:     ICD-10-CM   1. Stage 4 very severe COPD by GOLD classification ([place])  J44.9    Patient has very severe COPD by GOLD classification He is  currently not on maintenance inhaler Does have significant air trapping on PFTs Trial of Stiolto    2. Nocturnal hypoxemia due to emphysema ([facility])  J43.9    G47.36    Continue nocturnal oxygen use    3. Former cigarette smoker  Z87.891    Continue yearly LDCT for lung cancer screening No evidence of relapse     Meds ordered this encounter  Medications   Tiotropium Bromide-Olodaterol (STIOLTO RESPIMAT) 2.5-2.5 MCG/ACT AERS    Sig: Inhale 2.5 mcg into the lungs 2 (two) times daily.    Dispense:  4 g    Refill:  0    Order Specific Question:   Lot Number?    Answer:   [id number]    Order Specific Question:   Expiration Date?    Answer:   11/18/2023    Order Specific Question:   Quantity    Answer:   3   Patient was shown  the proper use of Stiolto inhaler.  This will be 2 puffs daily.  He is to let us know how he does with this medication so we can call it into his pharmacy of choice.  We will see him in follow-up in 3 months time he is to contact us prior to that time should any difficulties arise.    Renold Don, MD Advanced Bronchoscopy PCCM [facility]    *This note was dictated using voice recognition software/Dragon.  Despite best efforts to proofread, errors can occur which can change the meaning. Any transcriptional errors that result from this process are unintentional and may not be fully corrected at the time of dictation.

## 2021-07-04 ENCOUNTER — Telehealth: Payer: Self-pay | Admitting: Pulmonary Disease

## 2021-07-05 ENCOUNTER — Other Ambulatory Visit: Payer: Self-pay | Admitting: Internal Medicine

## 2021-07-05 ENCOUNTER — Telehealth: Payer: Self-pay | Admitting: Acute Care

## 2021-07-05 DIAGNOSIS — Z87891 Personal history of nicotine dependence: Secondary | ICD-10-CM

## 2021-07-05 NOTE — Telephone Encounter (Signed)
[facility] health team. Was placed on hold for >6min. Will call back.

## 2021-07-05 NOTE — Telephone Encounter (Signed)
Contacted patient by phone to review results of LDCT. Patient states he had OV this week with Dr. Duwayne Heck and she had reviewed the CT results with him.  I also questioned his documented year of 2008 as his QUIT smoking date, as this would complete his 15 years of not smoking.  The patient states he quit in 2018 and that was not correct.  Will make a note on his next CT order to make the correction.  Patient acknowledged understanding and nothing further needed.

## 2021-07-07 NOTE — Telephone Encounter (Signed)
Spoke to [place] with health team.  Patient has requested a tier exception. All questions have been answered.  Determination will be faxed to our office.

## 2021-07-10 NOTE — Telephone Encounter (Signed)
Received denial for teir exception from health team [employer].  Patient can appeal.  Patient is aware of denial and voiced his udnerstanding.  Nothing further needed.

## 2021-07-28 ENCOUNTER — Telehealth: Payer: Self-pay | Admitting: Pulmonary Disease

## 2021-07-28 ENCOUNTER — Telehealth: Payer: Self-pay

## 2021-07-28 NOTE — Telephone Encounter (Signed)
LOV notes printed and faxed over to [place] at [employer]. Nothing further needed.  ?

## 2021-07-28 NOTE — Telephone Encounter (Signed)
See telephone encounter.

## 2021-08-09 ENCOUNTER — Telehealth: Payer: Self-pay | Admitting: Pulmonary Disease

## 2021-08-09 MED ORDER — STIOLTO RESPIMAT 2.5-2.5 MCG/ACT IN AERS: 2.0000 | INHALATION_SPRAY | Freq: Every day | RESPIRATORY_TRACT | 11 refills | Status: DC

## 2021-08-09 NOTE — Telephone Encounter (Signed)
Stiolto sent to preferred pharmacy.  ?Patient is aware and voiced his understanding.  ?Nothing further needed.  ? ?

## 2021-09-01 DIAGNOSIS — H401132 Primary open-angle glaucoma, bilateral, moderate stage: Secondary | ICD-10-CM | POA: Diagnosis not present

## 2021-10-02 ENCOUNTER — Ambulatory Visit: Payer: PPO | Admitting: Pulmonary Disease

## 2021-10-02 ENCOUNTER — Encounter: Payer: Self-pay | Admitting: Pulmonary Disease

## 2021-10-02 VITALS — BP 110/60 | HR 89 | Temp 98.0°F | Ht 76.0 in | Wt 211.8 lb

## 2021-10-02 DIAGNOSIS — G4736 Sleep related hypoventilation in conditions classified elsewhere: Secondary | ICD-10-CM

## 2021-10-02 DIAGNOSIS — Z87891 Personal history of nicotine dependence: Secondary | ICD-10-CM | POA: Diagnosis not present

## 2021-10-02 DIAGNOSIS — J439 Emphysema, unspecified: Secondary | ICD-10-CM

## 2021-10-02 DIAGNOSIS — J449 Chronic obstructive pulmonary disease, unspecified: Secondary | ICD-10-CM | POA: Diagnosis not present

## 2021-10-02 DIAGNOSIS — J441 Chronic obstructive pulmonary disease with (acute) exacerbation: Secondary | ICD-10-CM

## 2021-10-02 DIAGNOSIS — J3089 Other allergic rhinitis: Secondary | ICD-10-CM

## 2021-10-02 MED ORDER — BREZTRI AEROSPHERE 160-9-4.8 MCG/ACT IN AERO: 2.0000 | INHALATION_SPRAY | Freq: Two times a day (BID) | RESPIRATORY_TRACT | 0 refills | Status: DC

## 2021-10-02 MED ORDER — METHYLPREDNISOLONE 4 MG PO TBPK: ORAL_TABLET | ORAL | 0 refills | Status: DC

## 2021-10-02 NOTE — Progress Notes (Signed)
Subjective:    Patient ID: Stephen Madden, male    DOB: 01/29/1951, 71 y.o.   MRN: [id number] Patient Care Team: Cletis Athens, MD as PCP - General (Internal Medicine) Noreene Filbert, MD as Radiation Oncologist (Radiation Oncology)  Chief Complaint  Patient presents with   Follow-up    SOB with exertion and prod cough with clear sputum in the morning.    HPI Stephen Madden is a 26 year old former smoker (quit 2018, 30 PY) patient presents for follow-up on COPD and dyspnea. Patient was most recently seen on 03 July 2021 by me.  He was given a trial of Stiolto at that time.  He does have severe COPD on the basis of emphysema.  He has nocturnal hypoxemia and uses oxygen nocturnally religiously.  Notes that he does very well with this is able to sleep well and wakes up refreshed in the mornings. Occasionally he will use the oxygen during the day if he feels short of breath.  Prior ambulatory oximetry did not show any desaturations with exercise.  He has not had any fevers, chills or sweats.  No weight loss or anorexia. Overall he feels that he has "good days and bad days".  Over the last 2 to 3 days has had some increased shortness of breath over baseline crease nasal congestion.  He attributes this to allergy.  He does have perennial allergic rhinitis with seasonal variation.  He has not had any hemoptysis or sputum production.  No cough.  He does not feel that Stiolto relieves his shortness of breath completely.   DATA 12/29/2020 PFTs: FEV1 0.98 L or 26% predicted, FVC 2.72 L or 56% predicted, FEV1/FVC 36%.  No bronchodilator response.  There is air trapping but no hyperinflation.  No high risk component.  Diffusion capacity related to severely impaired.  Consistent with severe COPD on the basis of emphysema. 06/29/2021 LDCT, lung cancer screening: Lung RADS 2S, diffuse bronchial wall thickening with moderate centrilobular and paraseptal emphysema consistent with underlying COPD.  Two-vessel coronary  artery disease suggested.  Review of Systems A 10 point review of systems was performed and it is as noted above otherwise negative.  Patient Active Problem List   Diagnosis Date Noted   COPD (chronic obstructive pulmonary disease) ([place]) 12/21/2019   Nocturnal hypoxia 12/21/2019   Recurrent spontaneous pneumothorax 05/12/2018   Pneumothorax on left 02/24/2018   Hx of colonic polyps    Benign neoplasm of rectosigmoid junction    Polyp of sigmoid colon    History of elevated PSA 08/11/2015   Erectile dysfunction of organic origin 08/11/2015   Acute kidney injury ([place])    S/P partial colectomy 07/11/2015   Health care maintenance    Benign neoplasm of transverse colon    Benign neoplasm of descending colon    Benign neoplasm of sigmoid colon    Rectal polyp    Elevated PSA 01/10/2015   BPH with obstruction/lower urinary tract symptoms 01/10/2015   Current Meds  Medication Sig   atenolol (TENORMIN) 50 MG tablet Take 1 tablet (50 mg total) by mouth daily.   brimonidine (ALPHAGAN) 0.2 % ophthalmic solution 1 drop 2 (two) times daily.   clotrimazole-betamethasone (LOTRISONE) cream APPLY 1 APPLICATION TOPICALLY 2 (TWO) TIMES DAILY AS NEEDED (FOR IRRITATION).   dorzolamide-timolol (COSOPT) 22.3-6.8 MG/ML ophthalmic solution 1 drop 2 (two) times daily.   latanoprost (XALATAN) 0.005 % ophthalmic solution Place 1 drop into both eyes at bedtime.   NIFEdipine (ADALAT CC) 30 MG 24 hr tablet Take  1 tablet (30 mg total) by mouth every morning.   [DISCONTINUED] chlorpheniramine (CHLOR-TRIMETON) 4 MG tablet Take 4 mg by mouth 2 (two) times daily as needed for allergies.   Tiotropium Bromide-Olodaterol (STIOLTO RESPIMAT) 2.5-2.5 MCG/ACT AERS Inhale 2 puffs into the lungs daily.   Immunization History  Administered Date(s) Administered   Fluad Quad(high Dose 65+) 04/17/2021   Influenza, High Dose Seasonal PF 06/12/2017   Influenza-Unspecified 03/21/2018   Moderna SARS-COV2 Booster Vaccination  03/29/2020, 10/07/2020, 04/19/2021   Moderna Sars-Covid-2 Vaccination 06/11/2019, 07/10/2019      Objective:   Physical Exam BP 110/60 (BP Location: Left Arm, Cuff Size: Normal)   Pulse 89   Temp 98 F (36.7 C) (Temporal)   Ht '6\' 4"'$  (1.93 m)   Wt 211 lb 12.8 oz (96.1 kg)   SpO2 96%   BMI 25.78 kg/m  GENERAL: Well-developed, well-nourished gentleman.  No acute distress.  Fully ambulatory, no conversational dyspnea. HEAD: Normocephalic, atraumatic.  EYES: Pupils equal, round, reactive to light.  No scleral icterus.  MOUTH: Oral mucosa moist, no thrush. NECK: Supple. No thyromegaly. Trachea midline. No JVD.  No adenopathy. PULMONARY: Good air entry bilaterally.  Faint end expiratory wheezes throughout, otherwise, no adventitious sounds. CARDIOVASCULAR: S1 and S2. Regular rate and rhythm.  No rubs, murmurs or gallops heard. ABDOMEN: Benign. MUSCULOSKELETAL: No joint deformity, no clubbing, no edema.  NEUROLOGIC: No focal deficit, no gait disturbance, speech is fluent. SKIN: Intact,warm,dry. PSYCH: Mood and behavior normal.    Assessment & Plan:     ICD-10-CM   1. Stage 4 very severe COPD by GOLD classification ([place])  J44.9    Not as well-controlled on Stiolto Trial of Breztri 2 puffs twice a day Continue as needed albuterol    2. COPD with acute exacerbation ([place]) - mild  J44.1    Triggered by environmental allergens Medrol Dosepak    3. Nocturnal hypoxemia due to emphysema ([facility])  J43.9    G47.36    Compliant with oxygen at 2 L/min Continue same    4. Perennial allergic rhinitis  J30.89    Seasonal variation noted This has triggered mild exacerbation Medrol Dosepak Resume Chlor-Trimeton Nasal hygiene    5. Former cigarette smoker  Z87.891    No evidence of relapse     Meds ordered this encounter  Medications   Budeson-Glycopyrrol-Formoterol (BREZTRI AEROSPHERE) 160-9-4.8 MCG/ACT AERO    Sig: Inhale 2 puffs into the lungs in the morning and at bedtime.     Dispense:  5.9 g    Refill:  0    Order Specific Question:   Lot Number?    Answer:   [id number]    Order Specific Question:   Expiration Date?    Answer:   03/21/2024    Order Specific Question:   Manufacturer?    Answer:   [employer] [[id number]]    Order Specific Question:   Quantity    Answer:   2   methylPREDNISolone (MEDROL DOSEPAK) 4 MG TBPK tablet    Sig: As directed in the package.    Dispense:  21 tablet    Refill:  0   The patient in follow-up in 2 to 3 months time he is to contact us sooner should any new problems arise.  Patient is also to let us know how the [name] is working for him we will fill this medication if it is efficacious.  Renold Don, MD Advanced Bronchoscopy PCCM [facility]    *This note was dictated using voice  recognition software/Dragon.  Despite best efforts to proofread, errors can occur which can change the meaning. Any transcriptional errors that result from this process are unintentional and may not be fully corrected at the time of dictation.

## 2021-10-02 NOTE — Patient Instructions (Addendum)
Stop the Stiolto. ? ?We are giving you a sample of Breztri that is 2 puffs twice a day.  This is a little bit stronger inhaler than the Stiolto.  Make sure you rinse your mouth well after you use it.  Make sure you let us know how you are doing with that so we can send in a prescription to your pharmacy. ? ?Since you already use your Stiolto today you may start the [facility] tomorrow. ? ?I am also sending a prescription for a medication called Medrol, this comes in a pack that instruct you how to take it every day its the number of pills that goes decreasing day by day.  This is called a taper pack.  This will help you with your allergies which I think are causing your main issue right now. ? ?We will see you in follow-up in 2 to 3 months time call sooner should any new problems arise. ? ? ?

## 2021-10-08 ENCOUNTER — Encounter: Payer: Self-pay | Admitting: Pulmonary Disease

## 2021-10-30 ENCOUNTER — Telehealth: Payer: Self-pay | Admitting: Pulmonary Disease

## 2021-10-30 MED ORDER — BREZTRI AEROSPHERE 160-9-4.8 MCG/ACT IN AERO: 2.0000 | INHALATION_SPRAY | Freq: Two times a day (BID) | RESPIRATORY_TRACT | 11 refills | Status: DC

## 2021-10-30 NOTE — Telephone Encounter (Signed)
Stephen Madden has been sent to preferred pharmacy. Patient is aware and voiced his understanding.  Nothing further needed.

## 2021-11-10 ENCOUNTER — Telehealth: Payer: Self-pay | Admitting: Pulmonary Disease

## 2021-11-10 MED ORDER — BREZTRI AEROSPHERE 160-9-4.8 MCG/ACT IN AERO: 2.0000 | INHALATION_SPRAY | Freq: Two times a day (BID) | RESPIRATORY_TRACT | 11 refills | Status: DC

## 2021-11-15 NOTE — Telephone Encounter (Signed)
Seems like encounter was open in error so closing encounter.  

## 2021-11-28 ENCOUNTER — Telehealth: Payer: Self-pay | Admitting: Pulmonary Disease

## 2021-11-28 NOTE — Telephone Encounter (Signed)
Anita, can you help with this? 

## 2021-11-29 NOTE — Telephone Encounter (Signed)
I spoke with Melissa with [employer] today and she stated she would check into this issue and get back with me

## 2021-12-05 NOTE — Telephone Encounter (Signed)
I had to leave a message for Melissa from [employer] to call me back about this issue since I have not heard from her since last week

## 2021-12-05 NOTE — Telephone Encounter (Signed)
Melissa from [facility] called me back and she stated that if they needed anything from the office they would pull it from Epic or call the office for the information needed

## 2021-12-12 ENCOUNTER — Telehealth: Payer: Self-pay | Admitting: Pulmonary Disease

## 2021-12-12 NOTE — Telephone Encounter (Signed)
I spoke with Stephen Madden with [employer] again today she stated that someone was suppose to be taking care of this issue by printed needed documents from Epic

## 2021-12-12 NOTE — Telephone Encounter (Signed)
Anita, can you assist with this? 

## 2021-12-13 NOTE — Telephone Encounter (Signed)
Stephen Madden with [employer] called me back and stated that they need resent notes and 02 SATS. Patient was last seen 10/02/21 didn't order new ONO.  He has an appt on 12/21/21 should have 02 recert at that time

## 2021-12-21 ENCOUNTER — Ambulatory Visit: Payer: PPO | Admitting: Pulmonary Disease

## 2021-12-21 ENCOUNTER — Encounter: Payer: Self-pay | Admitting: Pulmonary Disease

## 2021-12-21 VITALS — BP 138/80 | HR 93 | Temp 97.8°F | Ht 76.5 in | Wt 207.8 lb

## 2021-12-21 DIAGNOSIS — J449 Chronic obstructive pulmonary disease, unspecified: Secondary | ICD-10-CM

## 2021-12-21 DIAGNOSIS — Z87891 Personal history of nicotine dependence: Secondary | ICD-10-CM

## 2021-12-21 DIAGNOSIS — J9611 Chronic respiratory failure with hypoxia: Secondary | ICD-10-CM

## 2021-12-21 NOTE — Progress Notes (Signed)
Subjective:    Patient ID: Stephen Madden, male    DOB: Dec 18, 1950, 71 y.o.   MRN: [id number] Patient Care Team: Stephen Athens, MD as PCP - General (Internal Medicine) Stephen Filbert, MD as Radiation Oncologist (Radiation Oncology)  Chief Complaint  Patient presents with   Follow-up    SOB with exertion and prod cough with brownish sputum in the morning.     HPI Stephen Madden is a 64 year old former smoker (quit 2018, 30 PY) who  presents for follow-up on COPD and dyspnea. Patient was most recently seen on 02 Oct 2021 by me.  At that time he was given a trial of Breztri.  He was also noted to have a mild exacerbation due to environmental allergens and was treated with a Medrol Dosepak.  He does have severe COPD on the basis of emphysema.  He has nocturnal hypoxemia and uses oxygen nocturnally religiously.  Notes that he does very well with this is able to sleep well and wakes up refreshed in the mornings. Occasionally he will use the oxygen during the day if he feels short of breath.  Prior ambulatory oximetry did not show any desaturations with exercise.  However, he has noted that lately he has had to use his oxygen as needed during the day more than before.  He has not had any fevers, chills or sweats.  No weight loss or anorexia. Overall he feels that he has "good days and bad days".He has not had any hemoptysis or sputum production.  No cough.  Feels Stephen Madden has helped more than Stiolto did previously.  He has not had any orthopnea or paroxysmal nocturnal dyspnea.   DATA 12/29/2020 PFTs: FEV1 0.98 L or 26% predicted, FVC 2.72 L or 56% predicted, FEV1/FVC 36%.  No bronchodilator response.  There is air trapping but no hyperinflation.  No high risk component.  Diffusion capacity related to severely impaired.  Consistent with severe COPD on the basis of emphysema. 06/29/2021 LDCT, lung cancer screening: Lung RADS 2S, diffuse bronchial wall thickening with moderate centrilobular and paraseptal emphysema  consistent with underlying COPD.  Two-vessel coronary artery disease suggested.    Review of Systems A 10 point review of systems was performed and it is as noted above otherwise negative.  Patient Active Problem List   Diagnosis Date Noted   COPD (chronic obstructive pulmonary disease) ([place]) 12/21/2019   Nocturnal hypoxia 12/21/2019   Recurrent spontaneous pneumothorax 05/12/2018   Pneumothorax on left 02/24/2018   Hx of colonic polyps    Benign neoplasm of rectosigmoid junction    Polyp of sigmoid colon    History of elevated PSA 08/11/2015   Erectile dysfunction of organic origin 08/11/2015   Acute kidney injury ([place])    S/P partial colectomy 07/11/2015   Health care maintenance    Benign neoplasm of transverse colon    Benign neoplasm of descending colon    Benign neoplasm of sigmoid colon    Rectal polyp    Elevated PSA 01/10/2015   BPH with obstruction/lower urinary tract symptoms 01/10/2015   Social History   Tobacco Use   Smoking status: Former    Packs/day: 1.00    Years: 30.00    Total pack years: 30.00    Types: Cigarettes    Quit date: 11/19/2016    Years since quitting: 5.0   Smokeless tobacco: Never  Substance Use Topics   Alcohol use: Not Currently    Alcohol/week: 0.0 standard drinks of alcohol    Comment: some during  week   No Known Allergies Current Meds  Medication Sig   albuterol (VENTOLIN HFA) 108 (90 Base) MCG/ACT inhaler Inhale 2 puffs into the lungs every 4 (four) hours as needed for wheezing or shortness of breath.   atenolol (TENORMIN) 50 MG tablet Take 1 tablet (50 mg total) by mouth daily.   brimonidine (ALPHAGAN) 0.2 % ophthalmic solution 1 drop 2 (two) times daily.   Budeson-Glycopyrrol-Formoterol (BREZTRI AEROSPHERE) 160-9-4.8 MCG/ACT AERO Inhale 2 puffs into the lungs in the morning and at bedtime.   clotrimazole-betamethasone (LOTRISONE) cream APPLY 1 APPLICATION TOPICALLY 2 (TWO) TIMES DAILY AS NEEDED (FOR IRRITATION).    dorzolamide-timolol (COSOPT) 22.3-6.8 MG/ML ophthalmic solution 1 drop 2 (two) times daily.   latanoprost (XALATAN) 0.005 % ophthalmic solution Place 1 drop into both eyes at bedtime.   NIFEdipine (ADALAT CC) 30 MG 24 hr tablet Take 1 tablet (30 mg total) by mouth every morning.   [DISCONTINUED] methylPREDNISolone (MEDROL DOSEPAK) 4 MG TBPK tablet As directed in the package.   Immunization History  Administered Date(s) Administered   Fluad Quad(high Dose 65+) 04/17/2021   Influenza, High Dose Seasonal PF 06/12/2017   Influenza-Unspecified 03/21/2018   Moderna SARS-COV2 Booster Vaccination 03/29/2020, 10/07/2020, 04/19/2021   Moderna Sars-Covid-2 Vaccination 06/11/2019, 07/10/2019      Objective:   Physical Exam BP 138/80 (BP Location: Left Arm, Cuff Size: Normal)   Pulse 93   Temp 97.8 F (36.6 C) (Temporal)   Ht 6' 4.5" (1.943 m)   Wt 207 lb 12.8 oz (94.3 kg)   SpO2 92%   BMI 24.96 kg/m   SpO2: 92 % O2 Device: None (Room air)  GENERAL: Well-developed, well-nourished gentleman.  No acute distress.  Fully ambulatory, no conversational dyspnea. HEAD: Normocephalic, atraumatic.  EYES: Pupils equal, round, reactive to light.  No scleral icterus.  MOUTH: Oral mucosa moist, no thrush. NECK: Supple. No thyromegaly. Trachea midline. No JVD.  No adenopathy. PULMONARY: Distant breath sounds bilaterally.  Coarse, otherwise no adventitious sounds. CARDIOVASCULAR: S1 and S2. Regular rate and rhythm.  No rubs, murmurs or gallops heard. ABDOMEN: Benign. MUSCULOSKELETAL: No joint deformity, no clubbing, no edema.  NEUROLOGIC: No focal deficit, no gait disturbance, speech is fluent. SKIN: Intact,warm,dry. PSYCH: Mood and behavior normal.  Ambulatory oximetry was performed today: At rest heart rate was 80 bpm, on room air at rest oxygen saturation 96% after 500 feet of ambulation patient desaturated to 84% and had to be placed on 2 L/min nasal cannula to maintain O2 sats at 90 to 92%.      Assessment & Plan:     ICD-10-CM   1. Stage 4 very severe COPD by GOLD classification ([place])  J44.9 AMB REFERRAL FOR DME    Pulse oximetry, overnight   Continue Breztri 2 puffs twice a day Continue as needed albuterol    2. Chronic respiratory failure with hypoxia (HCC)  J96.11    Continues to require oxygen at 2 L/min Check overnight oximetry for O2 requirements    3. Former cigarette smoker  Z87.891    Enrolled in lung cancer screening No evidence of relapse     Orders Placed This Encounter  Procedures   AMB REFERRAL FOR DME    Referral Priority:   Routine    Referral Type:   Durable Medical Equipment Purchase    Number of Visits Requested:   1   Pulse oximetry, overnight    On roomair  [id number]    Standing Status:   Future    Standing Expiration  Date:   12/22/2022   Patient in follow-up in 3 months time he is to contact us prior to that time should any new difficulties arise.     Renold Don, MD Advanced Bronchoscopy PCCM [facility]    *This note was dictated using voice recognition software/Dragon.  Despite best efforts to proofread, errors can occur which can change the meaning. Any transcriptional errors that result from this process are unintentional and may not be fully corrected at the time of dictation.

## 2021-12-21 NOTE — Patient Instructions (Signed)
You continue to require oxygen.  The orders have been sent to [facility].  Continue your Breztri 2 puffs twice a day.  We will see you in follow-up in 3 months time call sooner should any new problems arise.

## 2021-12-25 NOTE — Telephone Encounter (Signed)
Patient was seen on 12/21/21 and walked in the office a new 02 order was placed that day

## 2021-12-28 ENCOUNTER — Encounter: Payer: Self-pay | Admitting: Pulmonary Disease

## 2022-01-16 ENCOUNTER — Telehealth: Payer: Self-pay

## 2022-01-16 NOTE — Telephone Encounter (Signed)
ONO reviewed by Dr. Patsey Berthold- patient continues to qualify for 2L QHS.   Patient is aware of results and voiced his understanding.  Nothing further needed.

## 2022-01-19 DIAGNOSIS — H401132 Primary open-angle glaucoma, bilateral, moderate stage: Secondary | ICD-10-CM | POA: Diagnosis not present

## 2022-01-25 DIAGNOSIS — J449 Chronic obstructive pulmonary disease, unspecified: Secondary | ICD-10-CM | POA: Diagnosis not present

## 2022-01-25 DIAGNOSIS — J939 Pneumothorax, unspecified: Secondary | ICD-10-CM | POA: Diagnosis not present

## 2022-01-25 DIAGNOSIS — J9383 Other pneumothorax: Secondary | ICD-10-CM | POA: Diagnosis not present

## 2022-01-30 ENCOUNTER — Other Ambulatory Visit: Payer: Self-pay | Admitting: Internal Medicine

## 2022-02-21 DIAGNOSIS — J449 Chronic obstructive pulmonary disease, unspecified: Secondary | ICD-10-CM | POA: Diagnosis not present

## 2022-02-21 DIAGNOSIS — J939 Pneumothorax, unspecified: Secondary | ICD-10-CM | POA: Diagnosis not present

## 2022-02-21 DIAGNOSIS — J9383 Other pneumothorax: Secondary | ICD-10-CM | POA: Diagnosis not present

## 2022-02-24 DIAGNOSIS — J939 Pneumothorax, unspecified: Secondary | ICD-10-CM | POA: Diagnosis not present

## 2022-02-24 DIAGNOSIS — J9383 Other pneumothorax: Secondary | ICD-10-CM | POA: Diagnosis not present

## 2022-02-24 DIAGNOSIS — J449 Chronic obstructive pulmonary disease, unspecified: Secondary | ICD-10-CM | POA: Diagnosis not present

## 2022-03-09 ENCOUNTER — Inpatient Hospital Stay: Payer: PPO | Attending: Radiation Oncology

## 2022-03-09 DIAGNOSIS — C61 Malignant neoplasm of prostate: Secondary | ICD-10-CM | POA: Diagnosis not present

## 2022-03-09 LAB — PSA: Prostatic Specific Antigen: 0.67 ng/mL (ref 0.00–4.00)

## 2022-03-14 DIAGNOSIS — I10 Essential (primary) hypertension: Secondary | ICD-10-CM | POA: Diagnosis not present

## 2022-03-14 DIAGNOSIS — Z87891 Personal history of nicotine dependence: Secondary | ICD-10-CM | POA: Diagnosis not present

## 2022-03-14 DIAGNOSIS — L309 Dermatitis, unspecified: Secondary | ICD-10-CM | POA: Diagnosis not present

## 2022-03-14 DIAGNOSIS — H401132 Primary open-angle glaucoma, bilateral, moderate stage: Secondary | ICD-10-CM | POA: Diagnosis not present

## 2022-03-14 DIAGNOSIS — G4733 Obstructive sleep apnea (adult) (pediatric): Secondary | ICD-10-CM | POA: Diagnosis not present

## 2022-03-14 DIAGNOSIS — J439 Emphysema, unspecified: Secondary | ICD-10-CM | POA: Diagnosis not present

## 2022-03-14 DIAGNOSIS — C61 Malignant neoplasm of prostate: Secondary | ICD-10-CM | POA: Diagnosis not present

## 2022-03-14 DIAGNOSIS — E663 Overweight: Secondary | ICD-10-CM | POA: Diagnosis not present

## 2022-03-23 ENCOUNTER — Ambulatory Visit: Payer: PPO | Admitting: Radiation Oncology

## 2022-03-23 ENCOUNTER — Encounter: Payer: Self-pay | Admitting: Pulmonary Disease

## 2022-03-23 ENCOUNTER — Ambulatory Visit: Payer: PPO | Admitting: Pulmonary Disease

## 2022-03-23 VITALS — BP 126/80 | HR 91 | Temp 97.9°F | Ht 76.5 in | Wt 216.4 lb

## 2022-03-23 DIAGNOSIS — G4736 Sleep related hypoventilation in conditions classified elsewhere: Secondary | ICD-10-CM | POA: Diagnosis not present

## 2022-03-23 DIAGNOSIS — J439 Emphysema, unspecified: Secondary | ICD-10-CM

## 2022-03-23 DIAGNOSIS — Z23 Encounter for immunization: Secondary | ICD-10-CM | POA: Diagnosis not present

## 2022-03-23 DIAGNOSIS — J449 Chronic obstructive pulmonary disease, unspecified: Secondary | ICD-10-CM

## 2022-03-23 DIAGNOSIS — Z87891 Personal history of nicotine dependence: Secondary | ICD-10-CM

## 2022-03-23 NOTE — Patient Instructions (Signed)
Continue taking your inhalers.  Continue use of oxygen.  You received your flu vaccine today.  We will see you in follow-up in 4 months time call sooner should any new problems arise.

## 2022-03-23 NOTE — Progress Notes (Unsigned)
Subjective:    Patient ID: Stephen Madden, male    DOB: [birth year], 71 y.o.   MRN: [id number] Patient Care Team: Cletis Athens, MD as PCP - General (Internal Medicine) Noreene Filbert, MD as Radiation Oncologist (Radiation Oncology) Tyler Pita, MD as Consulting Physician (Pulmonary Disease)  Chief Complaint  Patient presents with   Follow-up    COPD. SOB with exertion. Wheezing 2 times a week. Cough in the morning with clear sputum.    HPI Chane is a 71 year old former smoker (quit 2018, 30 PY) patient presents for follow-up on COPD and dyspnea. Patient was most recently seen on 21 December 2021 by me.  He has been on Breztri 2 puffs twice a day and notes that this medication helps him.  Had to use albuterol rescue since on Breztri.  He does have severe COPD on the basis of emphysema.  He has nocturnal hypoxemia and uses oxygen nocturnally religiously.  Notes that he does very well with this is able to sleep well and wakes up refreshed in the mornings. Occasionally he will use the oxygen during the day if he feels short of breath.  Prior ambulatory oximetry did not show any desaturations with exercise.  He has not had any fevers, chills or sweats.  No weight loss or anorexia. Overall he feels that he has "good days and bad days".  He has not had any fevers, chills or sweats.  He has not had any hemoptysis or sputum production.  No cough.  He is enrolled in lung cancer screening.  He wishes to get influenza vaccine today. Overall he feels well and looks well.   DATA 12/29/2020 PFTs: FEV1 0.98 L or 26% predicted, FVC 2.72 L or 56% predicted, FEV1/FVC 36%. No bronchodilator response.  There is air trapping but no hyperinflation.  No high risk component. Diffusion capacity related to severely impaired.  Consistent with severe COPD on the basis of emphysema. 06/29/2021 LDCT, lung cancer screening: Lung RADS 2S, diffuse bronchial wall thickening with moderate centrilobular and paraseptal emphysema  consistent with underlying COPD.  Two-vessel coronary artery disease suggested.   Review of Systems A 10 point review of systems was performed and it is as noted above otherwise negative.   Patient Active Problem List   Diagnosis Date Noted   COPD (chronic obstructive pulmonary disease) ([place]) 12/21/2019   Nocturnal hypoxia 12/21/2019   Recurrent spontaneous pneumothorax 05/12/2018   Pneumothorax on left 02/24/2018   Hx of colonic polyps    Benign neoplasm of rectosigmoid junction    Polyp of sigmoid colon    History of elevated PSA 08/11/2015   Erectile dysfunction of organic origin 08/11/2015   Acute kidney injury ([place])    S/P partial colectomy 07/11/2015   Health care maintenance    Benign neoplasm of transverse colon    Benign neoplasm of descending colon    Benign neoplasm of sigmoid colon    Rectal polyp    Elevated PSA 01/10/2015   BPH with obstruction/lower urinary tract symptoms 01/10/2015   Social History   Tobacco Use   Smoking status: Former    Packs/day: 1.00    Years: 30.00    Total pack years: 30.00    Types: Cigarettes    Quit date: 11/19/2016    Years since quitting: 5.3   Smokeless tobacco: Never  Substance Use Topics   Alcohol use: Not Currently    Alcohol/week: 0.0 standard drinks of alcohol    Comment: some during week   No  Known Allergies  Current Meds  Medication Sig   albuterol (VENTOLIN HFA) 108 (90 Base) MCG/ACT inhaler Inhale 2 puffs into the lungs every 4 (four) hours as needed for wheezing or shortness of breath.   atenolol (TENORMIN) 50 MG tablet Take 1 tablet (50 mg total) by mouth daily.   brimonidine (ALPHAGAN) 0.2 % ophthalmic solution 1 drop 2 (two) times daily.   Budeson-Glycopyrrol-Formoterol (BREZTRI AEROSPHERE) 160-9-4.8 MCG/ACT AERO Inhale 2 puffs into the lungs in the morning and at bedtime.   clotrimazole-betamethasone (LOTRISONE) cream APPLY 1 APPLICATION TOPICALLY 2 (TWO) TIMES DAILY AS NEEDED (FOR IRRITATION).    dorzolamide-timolol (COSOPT) 22.3-6.8 MG/ML ophthalmic solution 1 drop 2 (two) times daily.   latanoprost (XALATAN) 0.005 % ophthalmic solution Place 1 drop into both eyes at bedtime.   NIFEdipine (ADALAT CC) 30 MG 24 hr tablet TAKE 1 TABLET BY MOUTH EVERY MORNING   Immunization History  Administered Date(s) Administered   Fluad Quad(high Dose 65+) 04/17/2021   Influenza, High Dose Seasonal PF 06/12/2017   Influenza-Unspecified 03/21/2018   Moderna SARS-COV2 Booster Vaccination 03/29/2020, 10/07/2020, 04/19/2021   Moderna Sars-Covid-2 Vaccination 06/11/2019, 07/10/2019    Objective:   Physical Exam BP 126/80 (BP Location: Left Arm, Cuff Size: Large)   Pulse 91   Temp 97.9 F (36.6 C)   Ht 6' 4.5" (1.943 m)   Wt 216 lb 6.4 oz (98.2 kg)   SpO2 95%   BMI 26.00 kg/m  GENERAL: Well-developed, well-nourished gentleman.  No acute distress.  Fully ambulatory, no conversational dyspnea. HEAD: Normocephalic, atraumatic.  EYES: Pupils equal, round, reactive to light.  No scleral icterus.  MOUTH: Oral mucosa moist, no thrush. NECK: Supple. No thyromegaly. Trachea midline. No JVD.  No adenopathy. PULMONARY: Good air entry bilaterally.  Coarse breath sounds, otherwise, no adventitious sounds. CARDIOVASCULAR: S1 and S2. Regular rate and rhythm.  No rubs, murmurs or gallops heard. ABDOMEN: Benign. MUSCULOSKELETAL: No joint deformity, no clubbing, no edema.  NEUROLOGIC: No focal deficit, no gait disturbance, speech is fluent. SKIN: Intact,warm,dry. PSYCH: Mood and behavior normal.      Assessment & Plan:     ICD-10-CM   1. Stage 4 very severe COPD by GOLD classification ([place])  J44.9    Appears well compensated Continue Breztri 2 puffs twice a day Continue as needed albuterol    2. Nocturnal hypoxemia due to emphysema (HCC)  J43.9    G47.36    Compliant with oxygen at 2 L/min nocturnally Notes benefit from therapy Continue same    3. Former cigarette smoker  Z87.891    No evidence  of relapse    4. Need for immunization against influenza  Z23 Flu Vaccine QUAD High Dose(Fluad)   Patient received influenza vaccine today     Orders Placed This Encounter  Procedures   Flu Vaccine QUAD High Dose(Fluad)   We will see the patient in follow-up in 4 months time call sooner should any new problems arise.  Renold Don, MD Advanced Bronchoscopy PCCM [facility]    *This note was dictated using voice recognition software/Dragon.  Despite best efforts to proofread, errors can occur which can change the meaning. Any transcriptional errors that result from this process are unintentional and may not be fully corrected at the time of dictation.

## 2022-03-24 DIAGNOSIS — J939 Pneumothorax, unspecified: Secondary | ICD-10-CM | POA: Diagnosis not present

## 2022-03-24 DIAGNOSIS — J9383 Other pneumothorax: Secondary | ICD-10-CM | POA: Diagnosis not present

## 2022-03-24 DIAGNOSIS — J449 Chronic obstructive pulmonary disease, unspecified: Secondary | ICD-10-CM | POA: Diagnosis not present

## 2022-03-26 ENCOUNTER — Encounter: Payer: Self-pay | Admitting: Radiation Oncology

## 2022-03-26 ENCOUNTER — Telehealth: Payer: Self-pay | Admitting: Pulmonary Disease

## 2022-03-26 ENCOUNTER — Encounter: Payer: Self-pay | Admitting: Pulmonary Disease

## 2022-03-26 ENCOUNTER — Ambulatory Visit
Admission: RE | Admit: 2022-03-26 | Discharge: 2022-03-26 | Disposition: A | Discharge status: Home / Self Care | Payer: PPO | Source: Ambulatory Visit | Attending: Radiation Oncology | Admitting: Radiation Oncology

## 2022-03-26 VITALS — BP 125/85 | HR 72 | Temp 97.6°F | Resp 20 | Ht 76.5 in | Wt 215.4 lb

## 2022-03-26 DIAGNOSIS — C61 Malignant neoplasm of prostate: Secondary | ICD-10-CM

## 2022-03-26 DIAGNOSIS — Z191 Hormone sensitive malignancy status: Secondary | ICD-10-CM | POA: Diagnosis not present

## 2022-03-26 DIAGNOSIS — J449 Chronic obstructive pulmonary disease, unspecified: Secondary | ICD-10-CM

## 2022-03-26 NOTE — Telephone Encounter (Signed)
We can certainly order 1 however its not really going to increase lung function.

## 2022-03-26 NOTE — Telephone Encounter (Signed)
I have placed an order for [employer]. I notified the patient. Nothing further needed.

## 2022-03-26 NOTE — Telephone Encounter (Signed)
Patient is requesting incentive spirometry.

## 2022-03-26 NOTE — Progress Notes (Signed)
Radiation Oncology Follow up Note  Name: Stephen Madden   Date:   03/26/2022 MRN:  [id number] DOB: 02/04/1951    This 71 y.o. male presents to the clinic today for 3-year follow-up status post IMRT radiation therapy to his prostate for Gleason 6 adenocarcinoma.  REFERRING PROVIDER: Cletis Athens, MD  HPI: Patient is a 71 year old male now out over 3 years having completed IMRT radiation therapy to his prostate for Gleason 6 adenocarcinoma.Marland Kitchen  He is having very little lower urinary tract symptoms or diarrhea or fatigue.  His most recent PSA continues to decline was 0.672 weeks ago down from 0.79 a year ago.  COMPLICATIONS OF TREATMENT: none  FOLLOW UP COMPLIANCE: keeps appointments   PHYSICAL EXAM:  BP 125/85 (BP Location: Left Arm, Patient Position: Sitting, Cuff Size: Normal)   Pulse 72   Temp 97.6 F (36.4 C) (Tympanic)   Resp 20   Ht 6' 4.5" (1.943 m) Comment: stated HT  Wt 215 lb 6.4 oz (97.7 kg)   BMI 25.88 kg/m  Well-developed well-nourished patient in NAD. HEENT reveals PERLA, EOMI, discs not visualized.  Oral cavity is clear. No oral mucosal lesions are identified. Neck is clear without evidence of cervical or supraclavicular adenopathy. Lungs are clear to A&P. Cardiac examination is essentially unremarkable with regular rate and rhythm without murmur rub or thrill. Abdomen is benign with no organomegaly or masses noted. Motor sensory and DTR levels are equal and symmetric in the upper and lower extremities. Cranial nerves II through XII are grossly intact. Proprioception is intact. No peripheral adenopathy or edema is identified. No motor or sensory levels are noted. Crude visual fields are within normal range.  RADIOLOGY RESULTS: No current films for review  PLAN: The present time patient is doing well under excellent biochemical control of his prostate cancer now out over 3 years.  I have asked the patient be followed by his PMD to do a yearly PSA on him.  I would be happy to  reevaluate him anytime should that be indicated.  I would like to take this opportunity to thank you for allowing me to participate in the care of your patient.Noreene Filbert, MD

## 2022-03-27 DIAGNOSIS — J449 Chronic obstructive pulmonary disease, unspecified: Secondary | ICD-10-CM | POA: Diagnosis not present

## 2022-03-27 DIAGNOSIS — J9383 Other pneumothorax: Secondary | ICD-10-CM | POA: Diagnosis not present

## 2022-03-27 DIAGNOSIS — J939 Pneumothorax, unspecified: Secondary | ICD-10-CM | POA: Diagnosis not present

## 2022-03-28 DIAGNOSIS — J9383 Other pneumothorax: Secondary | ICD-10-CM | POA: Diagnosis not present

## 2022-03-28 DIAGNOSIS — J939 Pneumothorax, unspecified: Secondary | ICD-10-CM | POA: Diagnosis not present

## 2022-03-28 DIAGNOSIS — J449 Chronic obstructive pulmonary disease, unspecified: Secondary | ICD-10-CM | POA: Diagnosis not present

## 2022-04-16 ENCOUNTER — Telehealth: Payer: Self-pay

## 2022-04-16 NOTE — Patient Outreach (Signed)
  Care Coordination   Initial Visit Note   04/16/2022 Name: TEVON BERHANE MRN: [id number] DOB: [birth year]  [name] is a 71 y.o. year old male who sees Cletis Athens, MD for primary care. I spoke with  [name] by phone today.  What matters to the patients health and wellness today?  Patient states he does not have any nursing or community resource needs at this time.     Goals Addressed             This Visit's Progress    Care coordination activities:  no follow up needed       Care coordination program/ services discussed  Social determinants of health survey completed Patient advised to contact primary care provider office  if care coordination services needed in the future         SDOH assessments and interventions completed:  Yes  SDOH Interventions Today    Flowsheet Row Most Recent Value  SDOH Interventions   Food Insecurity Interventions Intervention Not Indicated  Housing Interventions Intervention Not Indicated  Transportation Interventions Intervention Not Indicated        Care Coordination Interventions:  Yes, provided   Follow up plan: No further intervention required.   Encounter Outcome:  Pt. Visit Completed   Quinn Plowman RN,BSN,CCM [facility] [phone] direct line

## 2022-04-23 DIAGNOSIS — J9383 Other pneumothorax: Secondary | ICD-10-CM | POA: Diagnosis not present

## 2022-04-23 DIAGNOSIS — J939 Pneumothorax, unspecified: Secondary | ICD-10-CM | POA: Diagnosis not present

## 2022-04-23 DIAGNOSIS — J449 Chronic obstructive pulmonary disease, unspecified: Secondary | ICD-10-CM | POA: Diagnosis not present

## 2022-05-01 ENCOUNTER — Telehealth: Payer: Self-pay | Admitting: Pulmonary Disease

## 2022-05-01 NOTE — Telephone Encounter (Signed)
Can we recom any other oxy supplier besides [employer]? He has been having issues with them One who coincides w/his provider? [phone]

## 2022-05-01 NOTE — Telephone Encounter (Signed)
I spoke with the patient. He said he is not happy with [employer] and how they are billing him. He has spoke with his insurance and they told him if he wants to switch DME companies he can. He wanted the names and numbers to the other DME companies we use so he can call and get quotes on oxygen concentrators and POC. I gave him the name and number to [place] and [place]. He will call us back after he decides which company he would like [place] to send a referral to.  Nothing further is needed.

## 2022-05-03 DIAGNOSIS — Z0000 Encounter for general adult medical examination without abnormal findings: Secondary | ICD-10-CM | POA: Diagnosis not present

## 2022-05-03 DIAGNOSIS — Z23 Encounter for immunization: Secondary | ICD-10-CM | POA: Diagnosis not present

## 2022-05-24 DIAGNOSIS — J449 Chronic obstructive pulmonary disease, unspecified: Secondary | ICD-10-CM | POA: Diagnosis not present

## 2022-05-24 DIAGNOSIS — J939 Pneumothorax, unspecified: Secondary | ICD-10-CM | POA: Diagnosis not present

## 2022-05-24 DIAGNOSIS — J9383 Other pneumothorax: Secondary | ICD-10-CM | POA: Diagnosis not present

## 2022-05-25 DIAGNOSIS — H401132 Primary open-angle glaucoma, bilateral, moderate stage: Secondary | ICD-10-CM | POA: Diagnosis not present

## 2022-06-24 DIAGNOSIS — J939 Pneumothorax, unspecified: Secondary | ICD-10-CM | POA: Diagnosis not present

## 2022-06-24 DIAGNOSIS — J9383 Other pneumothorax: Secondary | ICD-10-CM | POA: Diagnosis not present

## 2022-06-24 DIAGNOSIS — J449 Chronic obstructive pulmonary disease, unspecified: Secondary | ICD-10-CM | POA: Diagnosis not present

## 2022-06-29 DIAGNOSIS — H401132 Primary open-angle glaucoma, bilateral, moderate stage: Secondary | ICD-10-CM | POA: Diagnosis not present

## 2022-07-02 ENCOUNTER — Encounter: Payer: Self-pay | Admitting: Pulmonary Disease

## 2022-07-02 ENCOUNTER — Ambulatory Visit
Admission: RE | Admit: 2022-07-02 | Discharge: 2022-07-02 | Disposition: A | Discharge status: Home / Self Care | Payer: PPO | Source: Ambulatory Visit | Attending: Internal Medicine | Admitting: Internal Medicine

## 2022-07-02 ENCOUNTER — Other Ambulatory Visit: Payer: PPO

## 2022-07-02 DIAGNOSIS — Z87891 Personal history of nicotine dependence: Secondary | ICD-10-CM | POA: Insufficient documentation

## 2022-07-03 ENCOUNTER — Other Ambulatory Visit: Payer: Self-pay

## 2022-07-03 DIAGNOSIS — Z122 Encounter for screening for malignant neoplasm of respiratory organs: Secondary | ICD-10-CM

## 2022-07-03 DIAGNOSIS — Z87891 Personal history of nicotine dependence: Secondary | ICD-10-CM

## 2022-07-25 DIAGNOSIS — J309 Allergic rhinitis, unspecified: Secondary | ICD-10-CM | POA: Diagnosis not present

## 2022-07-25 DIAGNOSIS — G4733 Obstructive sleep apnea (adult) (pediatric): Secondary | ICD-10-CM | POA: Diagnosis not present

## 2022-07-25 DIAGNOSIS — H401132 Primary open-angle glaucoma, bilateral, moderate stage: Secondary | ICD-10-CM | POA: Diagnosis not present

## 2022-07-25 DIAGNOSIS — Z87891 Personal history of nicotine dependence: Secondary | ICD-10-CM | POA: Diagnosis not present

## 2022-07-25 DIAGNOSIS — Z9849 Cataract extraction status, unspecified eye: Secondary | ICD-10-CM | POA: Diagnosis not present

## 2022-07-25 DIAGNOSIS — I10 Essential (primary) hypertension: Secondary | ICD-10-CM | POA: Diagnosis not present

## 2022-07-25 DIAGNOSIS — J449 Chronic obstructive pulmonary disease, unspecified: Secondary | ICD-10-CM | POA: Diagnosis not present

## 2022-07-25 DIAGNOSIS — J439 Emphysema, unspecified: Secondary | ICD-10-CM | POA: Diagnosis not present

## 2022-07-25 DIAGNOSIS — E663 Overweight: Secondary | ICD-10-CM | POA: Diagnosis not present

## 2022-08-01 ENCOUNTER — Encounter: Payer: Self-pay | Admitting: Pulmonary Disease

## 2022-08-01 ENCOUNTER — Ambulatory Visit (INDEPENDENT_AMBULATORY_CARE_PROVIDER_SITE_OTHER): Payer: PPO | Admitting: Pulmonary Disease

## 2022-08-01 VITALS — BP 110/70 | HR 89 | Temp 97.7°F | Ht 76.5 in | Wt 212.0 lb

## 2022-08-01 DIAGNOSIS — Z87891 Personal history of nicotine dependence: Secondary | ICD-10-CM

## 2022-08-01 DIAGNOSIS — J449 Chronic obstructive pulmonary disease, unspecified: Secondary | ICD-10-CM

## 2022-08-01 DIAGNOSIS — J9611 Chronic respiratory failure with hypoxia: Secondary | ICD-10-CM

## 2022-08-01 MED ORDER — ALBUTEROL SULFATE HFA 108 (90 BASE) MCG/ACT IN AERS: 2.0000 | INHALATION_SPRAY | Freq: Four times a day (QID) | RESPIRATORY_TRACT | 2 refills | Status: DC | PRN

## 2022-08-01 NOTE — Progress Notes (Signed)
Subjective:    Patient ID: Stephen Madden, male    DOB: [birth year], 72 y.o.   MRN: [id number] Patient Care Team: Cletis Athens, MD as PCP - General (Internal Medicine) Noreene Filbert, MD as Radiation Oncologist (Radiation Oncology) Tyler Pita, MD as Consulting Physician (Pulmonary Disease)  Chief Complaint  Patient presents with   Follow-up    Breathing is good. SOB with exertion. No wheezing. Cough with clear sputum.    HPI Stephen Madden is a 72 year old former smoker (quit 2018, 30 PY) patient presents for follow-up on COPD and dyspnea. Patient was most recently seen on 23 March 2022 by me.  He has been on Breztri 2 puffs twice a day and notes that this medication helps him.  He has not had to use albuterol rescue inhaler since he has been on [employer].  He does have severe COPD on the basis of emphysema.  He has chronic respiratory failure with hypoxia and uses oxygen with activity and during sleep.  He is able to be off of oxygen with quiet activity/rest.  Notes that he does very well with this he notes that his exercise capacity has increased since he is using portable oxygen.  Prior ambulatory oximetry showed desaturations with exercise (21 December 2021). He has not had any fevers, chills or sweats.  No weight loss or anorexia. Overall he feels that he has done very well since his last visit.  He has not had any fevers, chills or sweats.  He has not had any hemoptysis or sputum production.  No cough.  He is enrolled in lung cancer screening. Overall he feels well and looks well.    DATA 12/29/2020 PFTs: FEV1 0.98 L or 26% predicted, FVC 2.72 L or 56% predicted, FEV1/FVC 36%. No bronchodilator response.  There is air trapping but no hyperinflation.  No high risk component. Diffusion capacity related to severely impaired.  Consistent with severe COPD on the basis of emphysema. 06/29/2021 LDCT, lung cancer screening: Lung RADS 2S, diffuse bronchial wall thickening with moderate centrilobular and  paraseptal emphysema consistent with underlying COPD.  Two-vessel coronary artery disease suggested.  07/02/2022 LDCT, lung cancer screening: Lung RADS 2, benign appearance or behavior, continue annual screening.  Severe emphysema.  Coronary artery calcifications as prior.  Review of Systems A 10 point review of systems was performed and it is as noted above otherwise negative.  Patient Active Problem List   Diagnosis Date Noted   COPD (chronic obstructive pulmonary disease) ([place]) 12/21/2019   Nocturnal hypoxia 12/21/2019   Recurrent spontaneous pneumothorax 05/12/2018   Pneumothorax on left 02/24/2018   Hx of colonic polyps    Benign neoplasm of rectosigmoid junction    Polyp of sigmoid colon    History of elevated PSA 08/11/2015   Erectile dysfunction of organic origin 08/11/2015   Acute kidney injury ([place])    S/P partial colectomy 07/11/2015   Health care maintenance    Benign neoplasm of transverse colon    Benign neoplasm of descending colon    Benign neoplasm of sigmoid colon    Rectal polyp    Elevated PSA 01/10/2015   BPH with obstruction/lower urinary tract symptoms 01/10/2015   Social History   Tobacco Use   Smoking status: Former    Packs/day: 1.00    Years: 30.00    Total pack years: 30.00    Types: Cigarettes    Quit date: 11/19/2016    Years since quitting: 5.7   Smokeless tobacco: Never  Substance Use Topics  Alcohol use: Not Currently    Alcohol/week: 0.0 standard drinks of alcohol    Comment: some during week   No Known Allergies  Current Meds  Medication Sig   albuterol (VENTOLIN HFA) 108 (90 Base) MCG/ACT inhaler Inhale 2 puffs into the lungs every 4 (four) hours as needed for wheezing or shortness of breath.   atenolol (TENORMIN) 50 MG tablet Take 1 tablet (50 mg total) by mouth daily.   brimonidine (ALPHAGAN) 0.2 % ophthalmic solution 1 drop 2 (two) times daily.   Budeson-Glycopyrrol-Formoterol (BREZTRI AEROSPHERE) 160-9-4.8 MCG/ACT AERO Inhale 2  puffs into the lungs in the morning and at bedtime.   clotrimazole-betamethasone (LOTRISONE) cream APPLY 1 APPLICATION TOPICALLY 2 (TWO) TIMES DAILY AS NEEDED (FOR IRRITATION).   dorzolamide-timolol (COSOPT) 22.3-6.8 MG/ML ophthalmic solution 1 drop 2 (two) times daily.   fexofenadine (ALLEGRA) 60 MG tablet Take 60 mg by mouth daily.   latanoprost (XALATAN) 0.005 % ophthalmic solution Place 1 drop into both eyes at bedtime.   NIFEdipine (ADALAT CC) 30 MG 24 hr tablet TAKE 1 TABLET BY MOUTH EVERY MORNING   Immunization History  Administered Date(s) Administered   Fluad Quad(high Dose 65+) 04/17/2021, 03/23/2022   Influenza, High Dose Seasonal PF 06/12/2017   Influenza-Unspecified 03/21/2018   Moderna SARS-COV2 Booster Vaccination 03/29/2020, 10/07/2020, 04/19/2021   Moderna Sars-Covid-2 Vaccination 06/11/2019, 07/10/2019      Objective:   Physical Exam BP 110/70 (BP Location: Left Arm, Cuff Size: Large)   Pulse 89   Temp 97.7 F (36.5 C)   Ht 6' 4.5" (1.943 m)   Wt 212 lb (96.2 kg)   SpO2 96%   BMI 25.47 kg/m   SpO2: 96 % O2 Device: None (Room air)  GENERAL: Well-developed, well-nourished gentleman.  No acute distress.  Fully ambulatory, no conversational dyspnea. HEAD: Normocephalic, atraumatic.  EYES: Pupils equal, round, reactive to light.  No scleral icterus.  MOUTH: Oral mucosa moist, no thrush. NECK: Supple. No thyromegaly. Trachea midline. No JVD.  No adenopathy. PULMONARY: Good air entry bilaterally.  Coarse breath sounds, otherwise, no adventitious sounds. CARDIOVASCULAR: S1 and S2. Regular rate and rhythm.  No rubs, murmurs or gallops heard. ABDOMEN: Benign. MUSCULOSKELETAL: No joint deformity, no clubbing, no edema.  NEUROLOGIC: No focal deficit, no gait disturbance, speech is fluent. SKIN: Intact,warm,dry. PSYCH: Mood and behavior normal.    Ambulatory oximetry was performed today: On room air patient's oxygen saturations were 96% after 550 feet nadir was noted  at 90%.    Assessment & Plan:     ICD-10-CM   1. Stage 4 very severe COPD by GOLD classification ([place])  J44.9    Continue Breztri 2 puffs twice a day Refill albuterol for as needed use Appears well compensated    2. Chronic respiratory failure with hypoxia (HCC)  J96.11    Continue oxygen at 2 L/min with exertion Continue oxygen at 2 L/min with sleep May be off oxygen with quiet activity/rest    3. Former cigarette smoker  Z87.891    No evidence of relapse Enrolled in lung cancer screening     Meds ordered this encounter  Medications   albuterol (VENTOLIN HFA) 108 (90 Base) MCG/ACT inhaler    Sig: Inhale 2 puffs into the lungs every 6 (six) hours as needed for wheezing or shortness of breath.    Dispense:  8 g    Refill:  2   Will see the patient in 4 to 6 months time he is to contact us prior to that time  should any new difficulties arise.  Renold Don, MD Advanced Bronchoscopy PCCM [facility]    *This note was dictated using voice recognition software/Dragon.  Despite best efforts to proofread, errors can occur which can change the meaning. Any transcriptional errors that result from this process are unintentional and may not be fully corrected at the time of dictation.

## 2022-08-01 NOTE — Patient Instructions (Signed)
Continue using your Breztri 2 puffs twice a day.  Continue using your oxygen when you are active.  We have sent a prescription for an emergency inhaler this is just to have just in case you experience any wheezing, shortness of breath or cough.  We will see you in follow-up in 4 to 6 months time call sooner should any new problems arise.

## 2022-08-06 ENCOUNTER — Ambulatory Visit: Payer: PPO | Admitting: Internal Medicine

## 2022-09-17 ENCOUNTER — Encounter: Payer: Self-pay | Admitting: Pulmonary Disease

## 2022-09-17 ENCOUNTER — Other Ambulatory Visit
Admission: RE | Admit: 2022-09-17 | Discharge: 2022-09-17 | Disposition: A | Discharge status: Home / Self Care | Payer: PPO | Source: Ambulatory Visit | Attending: Pulmonary Disease | Admitting: Pulmonary Disease

## 2022-09-17 ENCOUNTER — Ambulatory Visit
Admission: RE | Admit: 2022-09-17 | Discharge: 2022-09-17 | Disposition: A | Discharge status: Home / Self Care | Payer: PPO | Source: Ambulatory Visit | Attending: Pulmonary Disease | Admitting: Pulmonary Disease

## 2022-09-17 ENCOUNTER — Ambulatory Visit (INDEPENDENT_AMBULATORY_CARE_PROVIDER_SITE_OTHER): Payer: PPO | Admitting: Pulmonary Disease

## 2022-09-17 VITALS — BP 112/70 | HR 107 | Temp 97.9°F | Ht 76.5 in | Wt 209.0 lb

## 2022-09-17 DIAGNOSIS — R059 Cough, unspecified: Secondary | ICD-10-CM | POA: Insufficient documentation

## 2022-09-17 DIAGNOSIS — J441 Chronic obstructive pulmonary disease with (acute) exacerbation: Secondary | ICD-10-CM

## 2022-09-17 DIAGNOSIS — J9611 Chronic respiratory failure with hypoxia: Secondary | ICD-10-CM | POA: Diagnosis not present

## 2022-09-17 DIAGNOSIS — J449 Chronic obstructive pulmonary disease, unspecified: Secondary | ICD-10-CM | POA: Diagnosis not present

## 2022-09-17 DIAGNOSIS — Z1152 Encounter for screening for COVID-19: Secondary | ICD-10-CM | POA: Insufficient documentation

## 2022-09-17 DIAGNOSIS — J189 Pneumonia, unspecified organism: Secondary | ICD-10-CM | POA: Diagnosis not present

## 2022-09-17 LAB — RESP PANEL BY RT-PCR (RSV, FLU A&B, COVID)  RVPGX2
Influenza A by PCR: NEGATIVE
Influenza B by PCR: NEGATIVE
Resp Syncytial Virus by PCR: NEGATIVE
SARS Coronavirus 2 by RT PCR: NEGATIVE

## 2022-09-17 MED ORDER — AMOXICILLIN 500 MG PO CAPS: 500.0000 mg | ORAL_CAPSULE | Freq: Three times a day (TID) | ORAL | 0 refills | Status: AC

## 2022-09-17 MED ORDER — AZITHROMYCIN 250 MG PO TABS: ORAL_TABLET | ORAL | 0 refills | Status: AC

## 2022-09-17 MED ORDER — METHYLPREDNISOLONE 4 MG PO TBPK: ORAL_TABLET | ORAL | 0 refills | Status: DC

## 2022-09-17 NOTE — Patient Instructions (Addendum)
We have sent in some antibiotics and a prednisone like medication to your pharmacy.  We are going to get a virus test and chest x-ray.  We will call if any additional medications have to be given.  See you in follow-up in 3 to 4 weeks time with either me or the nurse practitioner at that time.  Call sooner should any new problems arise or if your symptoms worsen or fail to improve in the interim.

## 2022-09-17 NOTE — Progress Notes (Signed)
Subjective:    Patient ID: Stephen Madden, male    DOB: Jun 27, 1950, 72 y.o.   MRN: [id number] Patient Care Team: Corky Downs, MD as PCP - General (Internal Medicine) Carmina Miller, MD as Radiation Oncologist (Radiation Oncology) Salena Saner, MD as Consulting Physician (Pulmonary Disease)  Chief Complaint  Patient presents with   Follow-up    Symptoms started Thursday. Increased SOB. Cough with clear to brown sputum. Wheezing.    HPI Patient is a 72 year old former smoker with known stage IV COPD who presents for an ACUTE visit.  Patient notes that since Thursday, 25 April he started having issues with increased shortness of breath over his baseline, wheezing and cough productive of clear to brownish sputum.  He also noticed increased nasal congestion and chest congestion.  He is chronically on 2 L/min nasal cannula and on exertion has noted that his sats dropped to 88% whereas before he could hold without difficulty.  He went to his local pharmacy and asked for advice and they advised him to switch from Allegra to Claritin which helped some with his nasal congestion.  He inquires as to whether he can continue taking it.  He has noted no fevers, chills or sweats prior to last night when he felt that he was "chilled", did not take his temperature.  Has not tested for COVID-19.  Does not endorse any other symptomatology.  No chest pain.  No lower extremity edema or calf tenderness.  No hemoptysis.  No other symptomatology.  DATA 12/29/2020 PFTs: FEV1 0.98 L or 26% predicted, FVC 2.72 L or 56% predicted, FEV1/FVC 36%. No bronchodilator response.  There is air trapping but no hyperinflation.  No high risk component. Diffusion capacity related to severely impaired.  Consistent with severe COPD on the basis of emphysema. 06/29/2021 LDCT, lung cancer screening: Lung RADS 2S, diffuse bronchial wall thickening with moderate centrilobular and paraseptal emphysema consistent with underlying COPD.   Two-vessel coronary artery disease suggested.  07/02/2022 LDCT, lung cancer screening: Lung RADS 2, benign appearance or behavior, continue annual screening.  Severe emphysema.  Coronary artery calcifications as prior. Review of Systems A 10 point review of systems was performed and it is as noted above otherwise negative.  Patient Active Problem List   Diagnosis Date Noted   COPD (chronic obstructive pulmonary disease) (HCC) 12/21/2019   Nocturnal hypoxia 12/21/2019   Recurrent spontaneous pneumothorax 05/12/2018   Pneumothorax on left 02/24/2018   Hx of colonic polyps    Benign neoplasm of rectosigmoid junction    Polyp of sigmoid colon    History of elevated PSA 08/11/2015   Erectile dysfunction of organic origin 08/11/2015   Acute kidney injury (HCC)    S/P partial colectomy 07/11/2015   Health care maintenance    Benign neoplasm of transverse colon    Benign neoplasm of descending colon    Benign neoplasm of sigmoid colon    Rectal polyp    Elevated PSA 01/10/2015   BPH with obstruction/lower urinary tract symptoms 01/10/2015   Social History   Tobacco Use   Smoking status: Former    Packs/day: 1.00    Years: 30.00    Additional pack years: 0.00    Total pack years: 30.00    Types: Cigarettes    Quit date: 11/19/2016    Years since quitting: 5.8   Smokeless tobacco: Never  Substance Use Topics   Alcohol use: Not Currently    Alcohol/week: 0.0 standard drinks of alcohol    Comment:  some during week   No Known Allergies  Current Meds  Medication Sig   atenolol (TENORMIN) 50 MG tablet Take 1 tablet (50 mg total) by mouth daily.   brimonidine (ALPHAGAN) 0.2 % ophthalmic solution 1 drop 2 (two) times daily.   Budeson-Glycopyrrol-Formoterol (BREZTRI AEROSPHERE) 160-9-4.8 MCG/ACT AERO Inhale 2 puffs into the lungs in the morning and at bedtime.   clotrimazole-betamethasone (LOTRISONE) cream APPLY 1 APPLICATION TOPICALLY 2 (TWO) TIMES DAILY AS NEEDED (FOR IRRITATION).    dorzolamide-timolol (COSOPT) 22.3-6.8 MG/ML ophthalmic solution 1 drop 2 (two) times daily.   latanoprost (XALATAN) 0.005 % ophthalmic solution Place 1 drop into both eyes at bedtime.   loratadine (CLARITIN) 10 MG tablet Take 10 mg by mouth daily.   NIFEdipine (ADALAT CC) 30 MG 24 hr tablet TAKE 1 TABLET BY MOUTH EVERY MORNING   Immunization History  Administered Date(s) Administered   Fluad Quad(high Dose 65+) 04/17/2021, 03/23/2022   Influenza, High Dose Seasonal PF 06/12/2017   Influenza-Unspecified 03/21/2018   Moderna SARS-COV2 Booster Vaccination 03/29/2020, 10/07/2020, 04/19/2021   Moderna Sars-Covid-2 Vaccination 06/11/2019, 07/10/2019       Objective:   Physical Exam BP 112/70 (BP Location: Left Arm, Cuff Size: Normal)   Pulse (!) 107   Temp 97.9 F (36.6 C)   Ht 6' 4.5" (1.943 m)   Wt 209 lb (94.8 kg) Comment: Per Patient. in a wheelchair today  SpO2 92%   BMI 25.11 kg/m   SpO2: 92 % O2 Device: Nasal cannula O2 Flow Rate (L/min): 2 L/min O2 Type: Pulse O2  GENERAL: Well-developed, well-nourished gentleman.  No acute distress.  Presents in transport chair, no conversational dyspnea. HEAD: Normocephalic, atraumatic.  EYES: Pupils equal, round, reactive to light.  No scleral icterus.  MOUTH: Oral mucosa moist, no thrush. NECK: Supple. No thyromegaly. Trachea midline. No JVD.  No adenopathy. PULMONARY: Good air entry bilaterally.  Coarse breath sounds, some crackles on the right base, otherwise, no other adventitious sounds. CARDIOVASCULAR: S1 and S2.  Tachycardic rate and regular rhythm.  No rubs, murmurs or gallops heard. ABDOMEN: Benign. MUSCULOSKELETAL: No joint deformity, no clubbing, no edema.  NEUROLOGIC: No focal deficit, no gait disturbance, speech is fluent. SKIN: Intact,warm,dry. PSYCH: Mood and behavior normal.       Assessment & Plan:     ICD-10-CM   1. COPD with acute exacerbation (HCC)  J44.1 DG Chest 2 View    Resp panel by RT-PCR (RSV, Flu  A&B, Covid) Anterior Nasal Swab   Chest x-ray to rule out pneumonia Viral panel Amoxicillin plus azithromycin Medrol Dosepak    2. Stage 4 very severe COPD by GOLD classification (HCC)  J44.9    Continue Breztri and as needed albuterol    3. Chronic respiratory failure with hypoxia (HCC)  J96.11 DG Chest 2 View   Continue oxygen at 2 L/min with rest and during sleep May increase to 3 L/min with ambulation during acute illness.     Orders Placed This Encounter  Procedures   Resp panel by RT-PCR (RSV, Flu A&B, Covid) Anterior Nasal Swab    Standing Status:   Future    Standing Expiration Date:   10/17/2022   DG Chest 2 View    Standing Status:   Future    Standing Expiration Date:   09/17/2023    Order Specific Question:   Reason for Exam (SYMPTOM  OR DIAGNOSIS REQUIRED)    Answer:   Cough, SOB    Order Specific Question:   Preferred imaging location?  Answer:   [facility]   Meds ordered this encounter  Medications   methylPREDNISolone (MEDROL DOSEPAK) 4 MG TBPK tablet    Sig: Take as directed in package.    Dispense:  21 tablet    Refill:  0   azithromycin (ZITHROMAX) 250 MG tablet    Sig: Take 2 tablets (500 mg) on  Day 1,  followed by 1 tablet (250 mg) once daily on Days 2 through 5.    Dispense:  6 each    Refill:  0   amoxicillin (AMOXIL) 500 MG capsule    Sig: Take 1 capsule (500 mg total) by mouth 3 (three) times daily for 7 days.    Dispense:  21 capsule    Refill:  0   Patient was seen in follow-up in 3 to 4 weeks time he is to contact us prior to that time should any new difficulties arise.  He was instructed that if symptoms worsen he should present to the emergency room.  He was instructed also on increasing his liter flow of oxygen to 3 L/min while ambulating and may continue 2 L/min during quiet activity and sleep.  Gailen Shelter, MD Advanced Bronchoscopy PCCM [facility]    *This note was dictated using voice recognition  software/Dragon.  Despite best efforts to proofread, errors can occur which can change the meaning. Any transcriptional errors that result from this process are unintentional and may not be fully corrected at the time of dictation.

## 2022-09-24 ENCOUNTER — Telehealth: Payer: Self-pay | Admitting: Pulmonary Disease

## 2022-09-24 MED ORDER — ATENOLOL 50 MG PO TABS: 50.0000 mg | ORAL_TABLET | Freq: Every day | ORAL | 0 refills | Status: DC

## 2022-09-24 NOTE — Telephone Encounter (Signed)
We could send a month supply but I would recommend that he establish with a new primary care.

## 2022-09-24 NOTE — Telephone Encounter (Signed)
Stephen Madden is asking if you would be willing to send in a refill on his Tenormin 50mg . He says Dr. Harl Bowie hasn't responded to multiple requests from [facility] or from [name] himself. He states he is out of this med.

## 2022-09-24 NOTE — Telephone Encounter (Signed)
I have notified the patient and sent him in a one month supply of his medication. He said he is in the process of finding another PCP. His insurance company has mailed him a list of providers in this area. He also wanted me to let you know he is feeling a lot better since he saw you last week. He said the medications are working.

## 2022-09-24 NOTE — Telephone Encounter (Signed)
I am glad he is feeling better.  We seem to have gotten his pneumonia treated on time.

## 2022-10-01 ENCOUNTER — Other Ambulatory Visit: Payer: Self-pay | Admitting: Pulmonary Disease

## 2022-10-26 ENCOUNTER — Telehealth: Payer: Self-pay | Admitting: Pulmonary Disease

## 2022-10-26 NOTE — Telephone Encounter (Signed)
[employer] calling for the last office notes from April 1 onward in order to fill RX for O2.  Fax is [phone]

## 2022-10-26 NOTE — Telephone Encounter (Signed)
Last OV note faxed to [facility] at provided fax number. Received successful fax confirmation.  Nothing further needed.

## 2022-10-30 ENCOUNTER — Encounter: Payer: Self-pay | Admitting: Pulmonary Disease

## 2022-10-30 ENCOUNTER — Ambulatory Visit (INDEPENDENT_AMBULATORY_CARE_PROVIDER_SITE_OTHER): Payer: PPO | Admitting: Pulmonary Disease

## 2022-10-30 ENCOUNTER — Ambulatory Visit
Admission: RE | Admit: 2022-10-30 | Discharge: 2022-10-30 | Disposition: A | Discharge status: Home / Self Care | Payer: PPO | Source: Ambulatory Visit | Attending: Pulmonary Disease | Admitting: Pulmonary Disease

## 2022-10-30 VITALS — BP 120/80 | HR 62 | Temp 97.7°F | Ht 76.5 in | Wt 204.2 lb

## 2022-10-30 DIAGNOSIS — J449 Chronic obstructive pulmonary disease, unspecified: Secondary | ICD-10-CM

## 2022-10-30 DIAGNOSIS — J189 Pneumonia, unspecified organism: Secondary | ICD-10-CM | POA: Insufficient documentation

## 2022-10-30 DIAGNOSIS — J9611 Chronic respiratory failure with hypoxia: Secondary | ICD-10-CM

## 2022-10-30 DIAGNOSIS — Z87891 Personal history of nicotine dependence: Secondary | ICD-10-CM

## 2022-10-30 NOTE — Progress Notes (Unsigned)
Subjective:    Patient ID: Stephen Madden, male    DOB: 19-Jun-1950, 72 y.o.   MRN: [id number] Patient Care Team: Allegra Grana, FNP as PCP - General (Family Medicine) Carmina Miller, MD as Radiation Oncologist (Radiation Oncology) Salena Saner, MD as Consulting Physician (Pulmonary Disease)  Chief Complaint  Patient presents with   Follow-up    HPI Patient is a 72 year old former smoker with known stage IV COPD who presents as a follow-up visit from 17 September 2022.  At that time he was seen for an acute visit.  The patient was noted to have issues with increased shortness of breath, wheezing and cough productive to clear to brownish sputum during that visit.  Chest x-ray performed at that time showed a developing right lung field infiltrate.  He was treated with amoxicillin/azithromycin and Medrol Dosepak.  Symptoms improved dramatically with therapy.  He presents today feeling that he is back at baseline.  No issues with cough, no hemoptysis.  No fevers, chills or sweats.  Shortness of breath at baseline and manageable according to patient.  He does not endorse any other symptomatology.  DATA 12/29/2020 PFTs: FEV1 0.98 L or 26% predicted, FVC 2.72 L or 56% predicted, FEV1/FVC 36%. No bronchodilator response.  There is air trapping but no hyperinflation.  No high risk component. Diffusion capacity related to severely impaired.  Consistent with severe COPD on the basis of emphysema. 06/29/2021 LDCT, lung cancer screening: Lung RADS 2S, diffuse bronchial wall thickening with moderate centrilobular and paraseptal emphysema consistent with underlying COPD.  Two-vessel coronary artery disease suggested.  07/02/2022 LDCT, lung cancer screening: Lung RADS 2, benign appearance or behavior, continue annual screening.  Severe emphysema.  Coronary artery calcifications as prior. 09/17/2022 chest x-ray PA and lateral: Developing right midlung field pneumonia.  Review of Systems A 10 point  review of systems was performed and it is as noted above otherwise negative.  Patient Active Problem List   Diagnosis Date Noted   COPD (chronic obstructive pulmonary disease) (HCC) 12/21/2019   Nocturnal hypoxia 12/21/2019   Recurrent spontaneous pneumothorax 05/12/2018   Pneumothorax on left 02/24/2018   Hx of colonic polyps    Benign neoplasm of rectosigmoid junction    Polyp of sigmoid colon    History of elevated PSA 08/11/2015   Erectile dysfunction of organic origin 08/11/2015   Acute kidney injury (HCC)    S/P partial colectomy 07/11/2015   Health care maintenance    Benign neoplasm of transverse colon    Benign neoplasm of descending colon    Benign neoplasm of sigmoid colon    Rectal polyp    Elevated PSA 01/10/2015   BPH with obstruction/lower urinary tract symptoms 01/10/2015   Social History   Tobacco Use   Smoking status: Former    Packs/day: 1.00    Years: 30.00    Additional pack years: 0.00    Total pack years: 30.00    Types: Cigarettes    Quit date: 11/19/2016    Years since quitting: 5.9   Smokeless tobacco: Never  Substance Use Topics   Alcohol use: Not Currently    Alcohol/week: 0.0 standard drinks of alcohol    Comment: some during week   No Known Allergies  Current Meds  Medication Sig   atenolol (TENORMIN) 50 MG tablet Take 1 tablet (50 mg total) by mouth daily.   brimonidine (ALPHAGAN) 0.2 % ophthalmic solution 1 drop 2 (two) times daily.   Budeson-Glycopyrrol-Formoterol (BREZTRI AEROSPHERE) 160-9-4.8 MCG/ACT AERO  Inhale 2 puffs into the lungs in the morning and at bedtime.   clotrimazole-betamethasone (LOTRISONE) cream APPLY 1 APPLICATION TOPICALLY 2 (TWO) TIMES DAILY AS NEEDED (FOR IRRITATION).   dorzolamide-timolol (COSOPT) 22.3-6.8 MG/ML ophthalmic solution 1 drop 2 (two) times daily.   fexofenadine (ALLEGRA) 60 MG tablet Take 60 mg by mouth daily.   latanoprost (XALATAN) 0.005 % ophthalmic solution Place 1 drop into both eyes at bedtime.    NIFEdipine (ADALAT CC) 30 MG 24 hr tablet TAKE 1 TABLET BY MOUTH EVERY MORNING   Immunization History  Administered Date(s) Administered   Fluad Quad(high Dose 65+) 04/17/2021, 03/23/2022   Influenza, High Dose Seasonal PF 06/12/2017   Influenza-Unspecified 03/21/2018   Moderna SARS-COV2 Booster Vaccination 03/29/2020, 10/07/2020, 04/19/2021   Moderna Sars-Covid-2 Vaccination 06/11/2019, 07/10/2019      Objective:   Physical Exam BP 120/80 (BP Location: Left Arm, Patient Position: Sitting, Cuff Size: Normal)   Pulse 62   Temp 97.7 F (36.5 C) (Oral)   Ht 6' 4.5" (1.943 m)   Wt 204 lb 3.2 oz (92.6 kg)   SpO2 100%   BMI 24.53 kg/m   SpO2: 100 % O2 Device: Nasal cannula O2 Flow Rate (L/min): 2 L/min O2 Type: Pulse O2        Assessment & Plan:     ICD-10-CM   1. Pneumonia due to infectious organism, unspecified laterality, unspecified part of lung  J18.9 DG Chest 2 View   Clinically resolved Check chest x-ray to ensure resolution    2. Stage 4 very severe COPD by GOLD classification (HCC)  J44.9    Well compensated at present Continue Breztri 2 puffs twice a day Continue as needed albuterol    3. Chronic respiratory failure with hypoxia (HCC)  J96.11    Patient compliant with oxygen therapy Patient notes benefit from therapy Continue oxygen at 2 L/min 24/7    4. Former cigarette smoker  Z87.891    No evidence of relapse     Orders Placed This Encounter  Procedures   DG Chest 2 View    Standing Status:   Future    Number of Occurrences:   1    Standing Expiration Date:   10/30/2023    Order Specific Question:   Reason for Exam (SYMPTOM  OR DIAGNOSIS REQUIRED)    Answer:   f/u pna    Order Specific Question:   Preferred imaging location?    Answer:   [facility]    Order Specific Question:   Radiology Contrast Protocol - do NOT remove file path    Answer:   [email or web address]   Will repeat chest  x-ray today to ensure resolution of pneumonia.  Will see the patient in follow-up in 3 months time he is to call sooner should any new problems arise.  Gailen Shelter, MD Advanced Bronchoscopy PCCM  [facility]    *This note was dictated using voice recognition software/Dragon.  Despite best efforts to proofread, errors can occur which can change the meaning. Any transcriptional errors that result from this process are unintentional and may not be fully corrected at the time of dictation.

## 2022-10-30 NOTE — Patient Instructions (Signed)
We are going to get a chest x-ray today.  Continue using your Breztri.  We will see him in follow-up in 3 months time call sooner should any new problems arise.

## 2022-11-01 ENCOUNTER — Encounter: Payer: Self-pay | Admitting: Pulmonary Disease

## 2022-11-07 ENCOUNTER — Ambulatory Visit (INDEPENDENT_AMBULATORY_CARE_PROVIDER_SITE_OTHER): Payer: PPO | Admitting: Family

## 2022-11-07 ENCOUNTER — Encounter: Payer: Self-pay | Admitting: Family

## 2022-11-07 VITALS — BP 128/78 | HR 81 | Temp 97.9°F | Ht 76.0 in | Wt 207.0 lb

## 2022-11-07 DIAGNOSIS — Z23 Encounter for immunization: Secondary | ICD-10-CM

## 2022-11-07 DIAGNOSIS — I159 Secondary hypertension, unspecified: Secondary | ICD-10-CM

## 2022-11-07 DIAGNOSIS — J449 Chronic obstructive pulmonary disease, unspecified: Secondary | ICD-10-CM | POA: Diagnosis not present

## 2022-11-07 DIAGNOSIS — R21 Rash and other nonspecific skin eruption: Secondary | ICD-10-CM

## 2022-11-07 DIAGNOSIS — I10 Essential (primary) hypertension: Secondary | ICD-10-CM | POA: Insufficient documentation

## 2022-11-07 DIAGNOSIS — C61 Malignant neoplasm of prostate: Secondary | ICD-10-CM | POA: Diagnosis not present

## 2022-11-07 MED ORDER — NIFEDIPINE ER 30 MG PO TB24: 30.0000 mg | ORAL_TABLET | Freq: Every morning | ORAL | 3 refills | Status: DC

## 2022-11-07 MED ORDER — ATENOLOL 50 MG PO TABS
50.0000 mg | ORAL_TABLET | Freq: Every day | ORAL | 3 refills | Status: AC
Start: 2022-11-07 — End: ?

## 2022-11-07 MED ORDER — CLOTRIMAZOLE-BETAMETHASONE 1-0.05 % EX CREA
1.0000 | TOPICAL_CREAM | Freq: Two times a day (BID) | CUTANEOUS | 6 refills | Status: DC | PRN
Start: 2022-11-07 — End: 2023-04-10

## 2022-11-07 NOTE — Assessment & Plan Note (Signed)
History of prostate cancer dx 08/2018. S/p radiation only   PSA last checked 8 months ago 0.67 by Dr. Aggie Cosier. No decreased urinary retention, hesitancy.  He is no longer following with Dr Aggie Cosier.   I will recheck PSA annually to ensure stable

## 2022-11-07 NOTE — Progress Notes (Addendum)
Assessment & Plan:  Secondary hypertension Assessment & Plan: Chronic, stable.  Continue atenolol 50 mg daily, nifedipine 30 mg daily.  I have refilled today  Orders: -     Atenolol; Take 1 tablet (50 mg total) by mouth daily.  Dispense: 90 tablet; Refill: 3 -     NIFEdipine ER; Take 1 tablet (30 mg total) by mouth every morning.  Dispense: 90 tablet; Refill: 3  Rash -     Clotrimazole-Betamethasone; Apply 1 Application topically 2 (two) times daily as needed (for irritation).  Dispense: 30 g; Refill: 6  Chronic obstructive pulmonary disease, unspecified COPD type (HCC) Assessment & Plan: Symptomatically stable.  Continue following Dr Jayme Cloud.  Will follow   Prostate cancer [facility]) Assessment & Plan: History of prostate cancer dx 08/2018. S/p radiation only   PSA last checked 8 months ago 0.67 by Dr. Aggie Cosier. No decreased urinary retention, hesitancy.  He is no longer following with Dr Aggie Cosier.   I will recheck PSA annually to ensure stable   Need for vaccination with 20-polyvalent pneumococcal conjugate vaccine -     Pneumococcal conjugate vaccine 20-valent     Return precautions given.   Risks, benefits, and alternatives of the medications and treatment plan prescribed today were discussed, and patient expressed understanding.   Education regarding symptom management and diagnosis given to patient on AVS either electronically or printed.  Return in about 2 months (around 01/07/2023) for Medicare Wellness upcoming or due, schedule.  Rennie Plowman, FNP  Subjective:    Patient ID: Stephen Madden, male    DOB: [birth year], 72 y.o.   MRN: [id number]  CC: Stephen Madden is a 72 y.o. male who presents today to establish care.    HPI: Transfer Dr Juel Burrow  Feels well today. No complaints  He requests refill of atenolol, nifedipine.  No chest pain  Shortness of breath is stable, at baseline    He wears 02 when sleeping or around the house. He carries portable o2 'just in  case' however he is not wearing today.   Follow-up Dr. Jayme Cloud 10/30/2022 for pneumonia, COPD, chronic respiratory failure.  chest x-ray 10/30/2022 significant improvement right midlung zone pneumonia.  Compliant with Breztri, albuterol, oxygen therapy 2 L 24/7  Former smoker  Reports colonoscopy 5 years ago; he is due per patient.   History of prostate cancer dx 08/2018. S/p radiation only PSA last checked 8 months ago 0.67 by Dr. Aggie Cosier. No decreased urinary retention, hesitancy.  He is no longer following with Dr Aggie Cosier.   Due PCV20  Allergies: Patient has no known allergies. Current Outpatient Medications on File Prior to Visit  Medication Sig Dispense Refill   brimonidine (ALPHAGAN) 0.2 % ophthalmic solution 1 drop 2 (two) times daily.     Budeson-Glycopyrrol-Formoterol (BREZTRI AEROSPHERE) 160-9-4.8 MCG/ACT AERO Inhale 2 puffs into the lungs in the morning and at bedtime. 10.7 g 11   dorzolamide-timolol (COSOPT) 22.3-6.8 MG/ML ophthalmic solution 1 drop 2 (two) times daily.     fexofenadine (ALLEGRA) 60 MG tablet Take 60 mg by mouth daily.     latanoprost (XALATAN) 0.005 % ophthalmic solution Place 1 drop into both eyes at bedtime.     No current facility-administered medications on file prior to visit.    Review of Systems  Constitutional:  Negative for chills and fever.  Respiratory:  Negative for cough.   Cardiovascular:  Negative for chest pain and palpitations.  Gastrointestinal:  Negative for nausea and vomiting.      Objective:  BP 128/78   Pulse 81   Temp 97.9 F (36.6 C) (Oral)   Ht 6\' 4"  (1.93 m)   Wt 207 lb (93.9 kg)   SpO2 93%   BMI 25.20 kg/m  BP Readings from Last 3 Encounters:  11/07/22 128/78  10/30/22 120/80  09/17/22 112/70   Wt Readings from Last 3 Encounters:  11/07/22 207 lb (93.9 kg)  10/30/22 204 lb 3.2 oz (92.6 kg)  09/17/22 209 lb (94.8 kg)    Physical Exam Vitals reviewed.  Constitutional:      Appearance: He is well-developed.   Cardiovascular:     Rate and Rhythm: Regular rhythm.     Heart sounds: Normal heart sounds.  Pulmonary:     Effort: Pulmonary effort is normal. No respiratory distress.     Breath sounds: Normal breath sounds. No wheezing, rhonchi or rales.  Skin:    General: Skin is warm and dry.  Neurological:     Mental Status: He is alert.  Psychiatric:        Speech: Speech normal.        Behavior: Behavior normal.

## 2022-11-07 NOTE — Addendum Note (Signed)
Addended by: [place], Hunt Zajicek on: 11/07/2022 12:09 PM   Modules accepted: Orders

## 2022-11-07 NOTE — Addendum Note (Signed)
Addended by: Allegra Grana on: 11/07/2022 12:21 PM   Modules accepted: Level of Service

## 2022-11-07 NOTE — Assessment & Plan Note (Signed)
Symptomatically stable.  Continue following Dr Jayme Cloud.  Will follow

## 2022-11-07 NOTE — Assessment & Plan Note (Signed)
Chronic, stable.  Continue atenolol 50 mg daily, nifedipine 30 mg daily.  I have refilled today

## 2022-11-27 ENCOUNTER — Other Ambulatory Visit: Payer: Self-pay | Admitting: Pulmonary Disease

## 2022-12-19 ENCOUNTER — Encounter: Payer: Self-pay | Admitting: Ophthalmology

## 2022-12-20 ENCOUNTER — Encounter: Payer: Self-pay | Admitting: Ophthalmology

## 2022-12-24 NOTE — Discharge Instructions (Signed)

## 2022-12-26 ENCOUNTER — Other Ambulatory Visit: Payer: Self-pay

## 2022-12-26 ENCOUNTER — Encounter
Admission: RE | Disposition: A | Discharge status: Home / Self Care | Payer: Self-pay | Source: Home / Self Care | Attending: Ophthalmology

## 2022-12-26 ENCOUNTER — Ambulatory Visit: Payer: PPO | Admitting: Anesthesiology

## 2022-12-26 ENCOUNTER — Ambulatory Visit
Admission: RE | Admit: 2022-12-26 | Discharge: 2022-12-26 | Disposition: A | Discharge status: Home / Self Care | Payer: PPO | Attending: Ophthalmology | Admitting: Ophthalmology

## 2022-12-26 DIAGNOSIS — H2512 Age-related nuclear cataract, left eye: Secondary | ICD-10-CM | POA: Diagnosis not present

## 2022-12-26 DIAGNOSIS — J449 Chronic obstructive pulmonary disease, unspecified: Secondary | ICD-10-CM | POA: Diagnosis not present

## 2022-12-26 DIAGNOSIS — Z87891 Personal history of nicotine dependence: Secondary | ICD-10-CM | POA: Insufficient documentation

## 2022-12-26 DIAGNOSIS — Z9981 Dependence on supplemental oxygen: Secondary | ICD-10-CM | POA: Diagnosis not present

## 2022-12-26 DIAGNOSIS — I10 Essential (primary) hypertension: Secondary | ICD-10-CM | POA: Insufficient documentation

## 2022-12-26 DIAGNOSIS — H401123 Primary open-angle glaucoma, left eye, severe stage: Secondary | ICD-10-CM | POA: Insufficient documentation

## 2022-12-26 HISTORY — PX: CATARACT EXTRACTION W/PHACO: SHX586

## 2022-12-26 HISTORY — DX: Chronic respiratory failure with hypoxia: J96.11

## 2022-12-26 HISTORY — DX: Chronic respiratory failure with hypoxia: Z99.81

## 2022-12-26 HISTORY — DX: Chronic obstructive pulmonary disease, unspecified: J44.9

## 2022-12-26 HISTORY — DX: Personal history of malignant neoplasm of prostate: Z85.46

## 2022-12-26 HISTORY — DX: Presence of dental prosthetic device (complete) (partial): Z97.2

## 2022-12-26 SURGERY — PHACOEMULSIFICATION, CATARACT, WITH IOL INSERTION
Anesthesia: Monitor Anesthesia Care | Laterality: Left

## 2022-12-26 MED ORDER — FENTANYL CITRATE (PF) 100 MCG/2ML IJ SOLN
INTRAMUSCULAR | Status: DC | PRN
  Administered 2022-12-26 (×2): 50 ug via INTRAVENOUS

## 2022-12-26 MED ORDER — SIGHTPATH DOSE#1 BSS IO SOLN
INTRAOCULAR | Status: DC | PRN
  Administered 2022-12-26: 2 mL

## 2022-12-26 MED ORDER — LACTATED RINGERS IV SOLN: INTRAVENOUS | Status: DC

## 2022-12-26 MED ORDER — CEFUROXIME OPHTHALMIC INJECTION 1 MG/0.1 ML
INJECTION | OPHTHALMIC | Status: DC | PRN
  Administered 2022-12-26: 1 mg via INTRACAMERAL

## 2022-12-26 MED ORDER — TETRACAINE HCL 0.5 % OP SOLN
1.0000 [drp] | OPHTHALMIC | Status: DC | PRN
  Administered 2022-12-26 (×3): 1 [drp] via OPHTHALMIC

## 2022-12-26 MED ORDER — ARMC OPHTHALMIC DILATING DROPS
1.0000 | OPHTHALMIC | Status: DC | PRN
  Administered 2022-12-26 (×3): 1 via OPHTHALMIC

## 2022-12-26 MED ORDER — MIDAZOLAM HCL 2 MG/2ML IJ SOLN
INTRAMUSCULAR | Status: DC | PRN
  Administered 2022-12-26: 2 mg via INTRAVENOUS

## 2022-12-26 MED ORDER — SIGHTPATH DOSE#1 NA HYALUR & NA CHOND-NA HYALUR IO KIT
PACK | INTRAOCULAR | Status: DC | PRN
  Administered 2022-12-26: 1 via OPHTHALMIC

## 2022-12-26 MED ORDER — SIGHTPATH DOSE#1 BSS IO SOLN
INTRAOCULAR | Status: DC | PRN
  Administered 2022-12-26: 15 mL via INTRAOCULAR

## 2022-12-26 MED ORDER — SIGHTPATH DOSE#1 BSS IO SOLN
INTRAOCULAR | Status: DC | PRN
  Administered 2022-12-26: 62 mL via OPHTHALMIC

## 2022-12-26 SURGICAL SUPPLY — 11 items
BLADE DUAL KAHOOK SINGLE USE (BLADE) IMPLANT
CATARACT SUITE SIGHTPATH (MISCELLANEOUS) ×1 IMPLANT
FEE CATARACT SUITE SIGHTPATH (MISCELLANEOUS) ×1 IMPLANT
GLOVE SRG 8 PF TXTR STRL LF DI (GLOVE) ×1 IMPLANT
GLOVE SURG ENC TEXT LTX SZ7.5 (GLOVE) ×1 IMPLANT
GLOVE SURG UNDER POLY LF SZ8 (GLOVE) ×1
ICLIP (OPHTHALMIC RELATED) IMPLANT
LENS IOL TECNIS EYHANCE 22.0 (Intraocular Lens) IMPLANT
NDL FILTER BLUNT 18X1 1/2 (NEEDLE) ×1 IMPLANT
NEEDLE FILTER BLUNT 18X1 1/2 (NEEDLE) ×1 IMPLANT
SYR 3ML LL SCALE MARK (SYRINGE) ×1 IMPLANT

## 2022-12-26 NOTE — Transfer of Care (Signed)
Immediate Anesthesia Transfer of Care Note  Patient: Stephen Madden  Procedure(s) Performed: CATARACT EXTRACTION PHACO AND INTRAOCULAR LENS PLACEMENT (IOC) LEFT [place] DUAL BLADE GONIOTOMY 3.48 00:29.3 (Left)  Patient Location: PACU  Anesthesia Type: MAC  Level of Consciousness: awake, alert  and patient cooperative  Airway and Oxygen Therapy: Patient Spontanous Breathing and Patient connected to supplemental oxygen  Post-op Assessment: Post-op Vital signs reviewed, Patient's Cardiovascular Status Stable, Respiratory Function Stable, Patent Airway and No signs of Nausea or vomiting  Post-op Vital Signs: Reviewed and stable  Complications: No notable events documented.

## 2022-12-26 NOTE — Op Note (Signed)
PREOPERATIVE DIAGNOSIS:  Nuclear sclerotic cataract left eye. H25.12  severe stage Primary Open Angle Glaucoma left eye H40.1123  POSTOPERATIVE DIAGNOSIS:    Nuclear sclerotic cataract left eye.     severe stage Primary Open Angle Glaucoma left eye H40.1123  PROCEDURE:  Phacoemusification with posterior chamber intraocular lens placement of the left eye  [place] Dual Blade goniotomy left eye  Ultrasound time: Procedure(s): CATARACT EXTRACTION PHACO AND INTRAOCULAR LENS PLACEMENT (IOC) LEFT [place] DUAL BLADE GONIOTOMY 3.48 00:29.3 (Left)  LENS:  Implant Name Type Inv. Item Serial No. Manufacturer Lot No. LRB No. Used Action  LENS IOL TECNIS EYHANCE 22.0 - [id number] Intraocular Lens LENS IOL TECNIS EYHANCE 22.0 [id number] [employer]  Left 1 Implanted    SURGEON:  Deirdre Evener, MD   ANESTHESIA:  Topical with tetracaine drops augmented with 1% preservative-free intracameral lidocaine.    COMPLICATIONS:  None.   DESCRIPTION OF PROCEDURE:  The patient was identified in the holding room and transported to the operating room and placed in the supine position under the operating microscope.  The left eye was identified as the operative eye and it was prepped and draped in the usual sterile ophthalmic fashion.   A 1 millimeter clear-corneal paracentesis was made at the 5:30 position.  0.5 ml of preservative-free 1% lidocaine was injected into the anterior chamber.  The anterior chamber was filled with Viscoat viscoelastic.  A 2.4 millimeter keratome was used to make a near-clear corneal incision at the 2:30 position. The microscope was adjusted and a gonioprism was used to visulaize the trabecular meshwork.  [facility] Dual Blade was advanced across the anterior chamber under viscoelastic.  The blade was used to mark the trabecular meshwork at the 7:30 position.  The blade was placed two clock hours clockwise into the meshwork.  Proper postioning was confirmed.  The blade ws passed  counterclockwise through the meshwork to excise approximately two to three clock-hours of trabecular meshwork.   A curvilinear capsulorrhexis was made with a cystotome and capsulorrhexis forceps.  Balanced salt solution was used to hydrodissect and hydrodelineate the nucleus.   Phacoemulsification was then used in stop and chop fashion to remove the lens nucleus and epinucleus.  The remaining cortex was then removed using the irrigation and aspiration handpiece. Provisc was then placed into the capsular bag to distend it for lens placement.  A lens was then injected into the capsular bag.  The remaining viscoelastic was aspirated.   Wounds were hydrated with balanced salt solution.  The anterior chamber was inflated to a physiologic pressure with balanced salt solution.  No wound leaks were noted. Cefuroxime 0.1 ml of a 10mg /ml solution was injected into the anterior chamber for a dose of 1 mg of intracameral antibiotic at the completion of the case.  The patient was taken to the recovery room in stable condition without complications of anesthesia or surgery.

## 2022-12-26 NOTE — H&P (Signed)
[facility]   Primary Care Physician:  Allegra Grana, FNP Ophthalmologist: Dr. Lockie Mola  Pre-Procedure History & Physical: HPI:  Stephen Madden is a 72 y.o. male here for ophthalmic surgery.   Past Medical History:  Diagnosis Date   BPH with obstruction/lower urinary tract symptoms    Cancer [facility])    prostate   Chronic respiratory failure with hypoxia, on home O2 therapy (HCC)    COPD (chronic obstructive pulmonary disease) (HCC)    History of prostate cancer    Hypertension    Pneumothorax 02/23/2018   left   prostate cancer 08/20/2018   Stage 4 very severe COPD by GOLD classification (HCC)    Tobacco abuse    Wears dentures    partial lower    Past Surgical History:  Procedure Laterality Date   COLON SURGERY     COLONOSCOPY WITH PROPOFOL N/A 05/30/2015   Procedure: COLONOSCOPY WITH PROPOFOL;  Surgeon: Midge Minium, MD;  Location: [facility];  Service: Endoscopy;  Laterality: N/A;   COLONOSCOPY WITH PROPOFOL N/A 08/28/2016   Procedure: COLONOSCOPY WITH PROPOFOL;  Surgeon: Midge Minium, MD;  Location: [facility];  Service: Endoscopy;  Laterality: N/A;   CYSTOSCOPY W/ RETROGRADES Bilateral 07/11/2015   Procedure: CYSTOSCOPY WITH RETROGRADE PYELOGRAM;  Surgeon: Vanna Scotland, MD;  Location: [facility] ORS;  Service: Urology;  Laterality: Bilateral;   CYSTOSCOPY WITH STENT PLACEMENT Bilateral 07/11/2015   Procedure: CYSTOSCOPY WITH STENT PLACEMENT;  Surgeon: Vanna Scotland, MD;  Location: [facility] ORS;  Service: Urology;  Laterality: Bilateral;   LAPAROSCOPIC PARTIAL COLECTOMY N/A 07/11/2015   Procedure: Laparoscopic right hemicolectomy;  Surgeon: Gladis Riffle, MD;  Location: [facility] ORS;  Service: General;  Laterality: N/A;   POLYPECTOMY  05/30/2015   Procedure: POLYPECTOMY;  Surgeon: Midge Minium, MD;  Location: [facility];  Service: Endoscopy;;   VIDEO ASSISTED THORACOSCOPY (VATS) W/TALC PLEUADESIS Left 05/15/2018   Procedure: VIDEO ASSISTED  THORACOSCOPY (VATS) Thea Alken PLEUADESIS;  Surgeon: Hulda Marin, MD;  Location: [facility] ORS;  Service: Thoracic;  Laterality: Left;   VOLUME STUDY N/A 07/22/2018   Procedure: VOLUME STUDY;  Surgeon: Riki Altes, MD;  Location: [facility] ORS;  Service: Urology;  Laterality: N/A;    Prior to Admission medications   Medication Sig Start Date End Date Taking? Authorizing Provider  atenolol (TENORMIN) 50 MG tablet Take 1 tablet (50 mg total) by mouth daily. 11/07/22  Yes Arnett, Lyn Records, FNP  BREZTRI AEROSPHERE 160-9-4.8 MCG/ACT AERO INHALE 2 PUFFS INTO THE LUNGS IN THE MORNING AND AT BEDTIME. 11/27/22  Yes Salena Saner, MD  brimonidine (ALPHAGAN) 0.2 % ophthalmic solution 1 drop 3 (three) times daily. 03/07/21  Yes [provider]  chlorpheniramine (CHLOR-TRIMETON) 4 MG tablet Take 4 mg by mouth daily.   Yes [provider]  clotrimazole-betamethasone (LOTRISONE) cream Apply 1 Application topically 2 (two) times daily as needed (for irritation). 11/07/22  Yes Arnett, Lyn Records, FNP  dorzolamide (TRUSOPT) 2 % ophthalmic solution 1 drop 2 (two) times daily.   Yes [provider]  Glycerin (OPTASE DRY EYE INTENSE OP) Apply 1 drop to eye in the morning and at bedtime.   Yes [provider]  latanoprost (XALATAN) 0.005 % ophthalmic solution Place 1 drop into both eyes at bedtime.   Yes [provider]  NIFEdipine (ADALAT CC) 30 MG 24 hr tablet Take 1 tablet (30 mg total) by mouth every morning. 11/07/22  Yes Allegra Grana, FNP  OXYGEN Inhale 2 L into the lungs  at bedtime.   Yes [provider]  timolol (BETIMOL) 0.5 % ophthalmic solution Place 1 drop into both eyes 2 (two) times daily.   Yes [provider]    Allergies as of 10/23/2022   (No Known Allergies)    Family History  Problem Relation Age of Onset   Hypertension Mother    Heart disease Mother    Heart attack Mother 62   Diabetes Father    Hypertension Father    Stroke  Father 22   Hypertension Brother    Gout Brother    Cancer Neg Hx    Kidney disease Neg Hx    Prostate cancer Neg Hx    Bladder Cancer Neg Hx     Social History   Socioeconomic History   Marital status: Married    Spouse name: Stephen Madden   Number of children: Not on file   Years of education: Not on file   Highest education level: Not on file  Occupational History   Occupation: [employer]    Comment: retired  Tobacco Use   Smoking status: Former    Current packs/day: 0.00    Average packs/day: 1 pack/day for 30.0 years (30.0 ttl pk-yrs)    Types: Cigarettes    Start date: 11/20/1986    Quit date: 11/19/2016    Years since quitting: 6.1   Smokeless tobacco: Never  Vaping Use   Vaping status: Never Used  Substance and Sexual Activity   Alcohol use: Not Currently    Alcohol/week: 0.0 standard drinks of alcohol    Comment: some during week   Drug use: No   Sexual activity: Yes  Other Topics Concern   Not on file  Social History Narrative   Born in [place]   From [place]   Lives with wife , step daughter   Gun in home      He has son in [place]; he has 2 granddaughters         1 brother in [place]   1 brother in [place]   1 sister in [place]   1 brother [place]      1 sister passed      Social Determinants of Health   Financial Resource Strain: Low Risk  (04/20/2021)   Overall Financial Resource Strain (CARDIA)    Difficulty of Paying Living Expenses: Not hard at all  Food Insecurity: No Food Insecurity (04/16/2022)   Hunger Vital Sign    Worried About Running Out of Food in the Last Year: Never true    Ran Out of Food in the Last Year: Never true  Transportation Needs: No Transportation Needs (04/16/2022)   PRAPARE - Administrator, Civil Service (Medical): No    Lack of Transportation (Non-Medical): No  Physical Activity: Insufficiently Active (04/20/2021)   Exercise Vital Sign    Days of Exercise per Week: 3 days    Minutes of Exercise  per Session: 30 min  Stress: No Stress Concern Present (04/20/2021)   [employer] of Occupational Health - Occupational Stress Questionnaire    Feeling of Stress : Only a little  Social Connections: Moderately Integrated (04/20/2021)   Social Connection and Isolation Panel [NHANES]    Frequency of Communication with Friends and Family: More than three times a week    Frequency of Social Gatherings with Friends and Family: More than three times a week    Attends Religious Services: Never    Database administrator or Organizations: Yes  Attends Banker Meetings: More than 4 times per year    Marital Status: Married  Catering manager Violence: Not At Risk (04/20/2021)   Humiliation, Afraid, Rape, and Kick questionnaire    Fear of Current or Ex-Partner: No    Emotionally Abused: No    Physically Abused: No    Sexually Abused: No    Review of Systems: See HPI, otherwise negative ROS  Physical Exam: BP (!) 137/91   Pulse 70   Temp (!) 97.5 F (36.4 C) (Temporal)   Resp (!) 21   Ht 6\' 4"  (1.93 m)   Wt 92.5 kg   SpO2 94%   BMI 24.83 kg/m  General:   Alert,  pleasant and cooperative in NAD Head:  Normocephalic and atraumatic. Lungs:  Clear to auscultation.    Heart:  Regular rate and rhythm.   Impression/Plan: [name] is here for ophthalmic surgery.  Risks, benefits, limitations, and alternatives regarding ophthalmic surgery have been reviewed with the patient.  Questions have been answered.  All parties agreeable.   Lockie Mola, MD  12/26/2022, 9:08 AM

## 2022-12-26 NOTE — Anesthesia Postprocedure Evaluation (Signed)
Anesthesia Post Note  Patient: Stephen Madden  Procedure(s) Performed: CATARACT EXTRACTION PHACO AND INTRAOCULAR LENS PLACEMENT (IOC) LEFT [place] DUAL BLADE GONIOTOMY 3.48 00:29.3 (Left)  Patient location during evaluation: PACU Anesthesia Type: MAC Level of consciousness: awake and alert Pain management: pain level controlled Vital Signs Assessment: post-procedure vital signs reviewed and stable Respiratory status: spontaneous breathing, nonlabored ventilation, respiratory function stable and patient connected to nasal cannula oxygen Cardiovascular status: stable and blood pressure returned to baseline Postop Assessment: no apparent nausea or vomiting Anesthetic complications: no   No notable events documented.   Last Vitals:  Vitals:   12/26/22 0907 12/26/22 1040  BP: (!) 137/91 113/86  Pulse: 70 60  Resp: (!) 21 13  Temp: (!) 36.4 C (!) 36.2 C  SpO2: 94% 93%    Last Pain:  Vitals:   12/26/22 1040  TempSrc: Temporal  PainSc: 0-No pain                 Louie Boston

## 2022-12-26 NOTE — Anesthesia Preprocedure Evaluation (Addendum)
Anesthesia Evaluation  Patient identified by MRN, date of birth, ID band Patient awake    Reviewed: Allergy & Precautions, NPO status , Patient's Chart, lab work & pertinent test results  History of Anesthesia Complications Negative for: history of anesthetic complications  Airway Mallampati: III  TM Distance: >3 FB Neck ROM: full    Dental no notable dental hx.    Pulmonary COPD,  COPD inhaler and oxygen dependent, former smoker Uses 2L [place] at night    Pulmonary exam normal        Cardiovascular hypertension, On Home Beta Blockers Normal cardiovascular exam     Neuro/Psych negative neurological ROS  negative psych ROS   GI/Hepatic negative GI ROS, Neg liver ROS,,,  Endo/Other  negative endocrine ROS    Renal/GU Renal disease     Musculoskeletal   Abdominal   Peds  Hematology negative hematology ROS (+)   Anesthesia Other Findings Past Medical History: No date: BPH with obstruction/lower urinary tract symptoms No date: Cancer [facility])     Comment:  prostate No date: Chronic respiratory failure with hypoxia, on home O2 therapy  (HCC) No date: COPD (chronic obstructive pulmonary disease) (HCC) No date: History of prostate cancer No date: Hypertension 02/23/2018: Pneumothorax     Comment:  left 08/20/2018: prostate cancer No date: Stage 4 very severe COPD by GOLD classification (HCC) No date: Tobacco abuse No date: Wears dentures     Comment:  partial lower  Past Surgical History: No date: COLON SURGERY 05/30/2015: COLONOSCOPY WITH PROPOFOL; N/A     Comment:  Procedure: COLONOSCOPY WITH PROPOFOL;  Surgeon: Midge Minium, MD;  Location: [facility];  Service:               Endoscopy;  Laterality: N/A; 08/28/2016: COLONOSCOPY WITH PROPOFOL; N/A     Comment:  Procedure: COLONOSCOPY WITH PROPOFOL;  Surgeon: Midge Minium, MD;  Location: [facility] END[facility];  Service: Endoscopy;               Laterality: N/A; 07/11/2015: CYSTOSCOPY W/ RETROGRADES; Bilateral     Comment:  Procedure: CYSTOSCOPY WITH RETROGRADE PYELOGRAM;                Surgeon: Vanna Scotland, MD;  Location: [facility] ORS;                Service: Urology;  Laterality: Bilateral; 07/11/2015: CYSTOSCOPY WITH STENT PLACEMENT; Bilateral     Comment:  Procedure: CYSTOSCOPY WITH STENT PLACEMENT;  Surgeon:               Vanna Scotland, MD;  Location: [facility] ORS;  Service:               Urology;  Laterality: Bilateral; 07/11/2015: LAPAROSCOPIC PARTIAL COLECTOMY; N/A     Comment:  Procedure: Laparoscopic right hemicolectomy;  Surgeon:               Gladis Riffle, MD;  Location: [facility] ORS;  Service:               General;  Laterality: N/A; 05/30/2015: POLYPECTOMY     Comment:  Procedure: POLYPECTOMY;  Surgeon: Midge Minium, MD;                Location: [facility];  Service: Endoscopy;; 05/15/2018: VIDEO ASSISTED THORACOSCOPY (VATS) W/TALC PLEUADESIS; Left  Comment:  Procedure: VIDEO ASSISTED THORACOSCOPY (VATS) W/TALC               PLEUADESIS;  Surgeon: Hulda Marin, MD;  Location: [facility]               ORS;  Service: Thoracic;  Laterality: Left; 07/22/2018: VOLUME STUDY; N/A     Comment:  Procedure: VOLUME STUDY;  Surgeon: Riki Altes, MD;              Location: [facility] ORS;  Service: Urology;  Laterality: N/A;  BMI    Body Mass Index: 24.34 kg/m      Reproductive/Obstetrics negative OB ROS                             Anesthesia Physical Anesthesia Plan  ASA: 3  Anesthesia Plan: MAC   Post-op Pain Management: Minimal or no pain anticipated   Induction: Intravenous  PONV Risk Score and Plan:   Airway Management Planned: Natural Airway and Nasal Cannula  Additional Equipment:   Intra-op Plan:   Post-operative Plan:   Informed Consent: I have reviewed the patients History and Physical, chart, labs and discussed the procedure including the risks, benefits and  alternatives for the proposed anesthesia with the patient or authorized representative who has indicated his/her understanding and acceptance.     Dental Advisory Given  Plan Discussed with: Anesthesiologist, CRNA and Surgeon  Anesthesia Plan Comments: (Patient consented for risks of anesthesia including but not limited to:  - adverse reactions to medications - damage to eyes, teeth, lips or other oral mucosa - nerve damage due to positioning  - sore throat or hoarseness - Damage to heart, brain, nerves, lungs, other parts of body or loss of life  Patient voiced understanding.)        Anesthesia Quick Evaluation

## 2022-12-27 ENCOUNTER — Encounter: Payer: Self-pay | Admitting: Ophthalmology

## 2023-01-03 ENCOUNTER — Encounter: Payer: Self-pay | Admitting: Ophthalmology

## 2023-01-03 NOTE — Anesthesia Preprocedure Evaluation (Addendum)
Anesthesia Evaluation  Patient identified by MRN, date of birth, ID band Patient awake    Reviewed: Allergy & Precautions, H&P , NPO status , Patient's Chart, lab work & pertinent test results  Airway Mallampati: III  TM Distance: >3 FB Neck ROM: Full    Dental no notable dental hx.    Pulmonary COPD, former smoker 02 L/m/n/c at night   Pulmonary exam normal breath sounds clear to auscultation       Cardiovascular hypertension, Normal cardiovascular exam Rhythm:Regular Rate:Normal     Neuro/Psych negative neurological ROS  negative psych ROS   GI/Hepatic negative GI ROS, Neg liver ROS,,,  Endo/Other  negative endocrine ROS    Renal/GU Renal disease  negative genitourinary   Musculoskeletal negative musculoskeletal ROS (+)    Abdominal   Peds negative pediatric ROS (+)  Hematology negative hematology ROS (+)   Anesthesia Other Findings BPH with obstruction/lower urinary tract symptoms  prostate cancer Tobacco abuse  Hypertension COPD (chronic obstructive pulmonary disease) (HCC)  Pneumothorax Cancer (HCC)  Wears dentures Stage 4 very severe COPD by GOLD classification (HCC)  Chronic respiratory failure with hypoxia, on home O2 therapy (HCC) History of prostate cancer   Previous cataract 12-26-22  Reproductive/Obstetrics negative OB ROS                             Anesthesia Physical Anesthesia Plan  ASA: 3  Anesthesia Plan: MAC   Post-op Pain Management:    Induction: Intravenous  PONV Risk Score and Plan:   Airway Management Planned: Natural Airway and Nasal Cannula  Additional Equipment:   Intra-op Plan:   Post-operative Plan:   Informed Consent: I have reviewed the patients History and Physical, chart, labs and discussed the procedure including the risks, benefits and alternatives for the proposed anesthesia with the patient or authorized representative who has  indicated his/her understanding and acceptance.     Dental Advisory Given  Plan Discussed with: Anesthesiologist, CRNA and Surgeon  Anesthesia Plan Comments: (Patient consented for risks of anesthesia including but not limited to:  - adverse reactions to medications - damage to eyes, teeth, lips or other oral mucosa - nerve damage due to positioning  - sore throat or hoarseness - Damage to heart, brain, nerves, lungs, other parts of body or loss of life  Patient voiced understanding.)        Anesthesia Quick Evaluation

## 2023-01-04 ENCOUNTER — Telehealth: Payer: Self-pay | Admitting: Family

## 2023-01-04 NOTE — Telephone Encounter (Signed)
Call patient Reviewing his chart his colonoscopy is out of date.  He had in 2018 and he was due to have this done in 2021.  Please ask if he can make this referral to Dr Servando Snare, [facility]  Please place referral if agreeable

## 2023-01-04 NOTE — Telephone Encounter (Signed)
LVM to CB.

## 2023-01-08 MED ORDER — ASPIRIN 81 MG PO CHEW
CHEWABLE_TABLET | ORAL | Status: AC
  Filled 2023-01-08: qty 1

## 2023-01-08 NOTE — Discharge Instructions (Signed)

## 2023-01-09 ENCOUNTER — Ambulatory Visit: Payer: PPO | Admitting: Anesthesiology

## 2023-01-09 ENCOUNTER — Encounter
Admission: RE | Disposition: A | Discharge status: Home / Self Care | Payer: Self-pay | Source: Home / Self Care | Attending: Ophthalmology

## 2023-01-09 ENCOUNTER — Ambulatory Visit
Admission: RE | Admit: 2023-01-09 | Discharge: 2023-01-09 | Disposition: A | Discharge status: Home / Self Care | Payer: PPO | Attending: Ophthalmology | Admitting: Ophthalmology

## 2023-01-09 ENCOUNTER — Encounter: Payer: Self-pay | Admitting: Ophthalmology

## 2023-01-09 ENCOUNTER — Other Ambulatory Visit: Payer: Self-pay

## 2023-01-09 DIAGNOSIS — Z87891 Personal history of nicotine dependence: Secondary | ICD-10-CM | POA: Diagnosis not present

## 2023-01-09 DIAGNOSIS — Z8546 Personal history of malignant neoplasm of prostate: Secondary | ICD-10-CM | POA: Diagnosis not present

## 2023-01-09 DIAGNOSIS — Z9981 Dependence on supplemental oxygen: Secondary | ICD-10-CM | POA: Insufficient documentation

## 2023-01-09 DIAGNOSIS — I10 Essential (primary) hypertension: Secondary | ICD-10-CM | POA: Insufficient documentation

## 2023-01-09 DIAGNOSIS — H2511 Age-related nuclear cataract, right eye: Secondary | ICD-10-CM | POA: Diagnosis present

## 2023-01-09 DIAGNOSIS — H401113 Primary open-angle glaucoma, right eye, severe stage: Secondary | ICD-10-CM | POA: Insufficient documentation

## 2023-01-09 DIAGNOSIS — J449 Chronic obstructive pulmonary disease, unspecified: Secondary | ICD-10-CM | POA: Diagnosis not present

## 2023-01-09 HISTORY — DX: Primary open-angle glaucoma, unspecified eye, stage unspecified: H40.1190

## 2023-01-09 HISTORY — PX: CATARACT EXTRACTION W/PHACO: SHX586

## 2023-01-09 SURGERY — PHACOEMULSIFICATION, CATARACT, WITH IOL INSERTION
Anesthesia: Monitor Anesthesia Care | Laterality: Right

## 2023-01-09 MED ORDER — TETRACAINE HCL 0.5 % OP SOLN
1.0000 [drp] | OPHTHALMIC | Status: DC | PRN
  Administered 2023-01-09 (×3): 1 [drp] via OPHTHALMIC

## 2023-01-09 MED ORDER — SIGHTPATH DOSE#1 BSS IO SOLN
INTRAOCULAR | Status: DC | PRN
  Administered 2023-01-09: 15 mL via INTRAOCULAR

## 2023-01-09 MED ORDER — ARMC OPHTHALMIC DILATING DROPS
1.0000 | OPHTHALMIC | Status: DC | PRN
  Administered 2023-01-09 (×3): 1 via OPHTHALMIC

## 2023-01-09 MED ORDER — FENTANYL CITRATE (PF) 100 MCG/2ML IJ SOLN
INTRAMUSCULAR | Status: DC | PRN
  Administered 2023-01-09: 100 ug via INTRAVENOUS

## 2023-01-09 MED ORDER — MIDAZOLAM HCL 2 MG/2ML IJ SOLN
INTRAMUSCULAR | Status: DC | PRN
  Administered 2023-01-09 (×2): 2 mg via INTRAVENOUS

## 2023-01-09 MED ORDER — SIGHTPATH DOSE#1 BSS IO SOLN
INTRAOCULAR | Status: DC | PRN
  Administered 2023-01-09: 2 mL

## 2023-01-09 MED ORDER — LACTATED RINGERS IV SOLN: INTRAVENOUS | Status: DC

## 2023-01-09 MED ORDER — CEFUROXIME OPHTHALMIC INJECTION 1 MG/0.1 ML
INJECTION | OPHTHALMIC | Status: DC | PRN
  Administered 2023-01-09: 1 mg via INTRACAMERAL

## 2023-01-09 MED ORDER — SIGHTPATH DOSE#1 BSS IO SOLN
INTRAOCULAR | Status: DC | PRN
  Administered 2023-01-09: 51 mL via OPHTHALMIC

## 2023-01-09 MED ORDER — BRIMONIDINE TARTRATE-TIMOLOL 0.2-0.5 % OP SOLN
OPHTHALMIC | Status: DC | PRN
  Administered 2023-01-09: 1 [drp] via OPHTHALMIC

## 2023-01-09 MED ORDER — SIGHTPATH DOSE#1 NA HYALUR & NA CHOND-NA HYALUR IO KIT
PACK | INTRAOCULAR | Status: DC | PRN
  Administered 2023-01-09: 1 via OPHTHALMIC

## 2023-01-09 SURGICAL SUPPLY — 10 items
BLADE DUAL KAHOOK SINGLE USE (BLADE) IMPLANT
CATARACT SUITE SIGHTPATH (MISCELLANEOUS) ×1
FEE CATARACT SUITE SIGHTPATH (MISCELLANEOUS) ×1 IMPLANT
GLOVE SRG 8 PF TXTR STRL LF DI (GLOVE) ×1 IMPLANT
GLOVE SURG ENC TEXT LTX SZ7.5 (GLOVE) ×1 IMPLANT
GLOVE SURG UNDER POLY LF SZ8 (GLOVE) ×1
LENS IOL TECNIS EYHANCE 22.0 (Intraocular Lens) IMPLANT
NDL FILTER BLUNT 18X1 1/2 (NEEDLE) ×1 IMPLANT
NEEDLE FILTER BLUNT 18X1 1/2 (NEEDLE) ×1
SYR 3ML LL SCALE MARK (SYRINGE) ×1 IMPLANT

## 2023-01-09 NOTE — Op Note (Signed)
PREOPERATIVE DIAGNOSIS:  Nuclear sclerotic cataract  right eye. H25.11  severe stage Primary Open Angle Glaucoma right eye H40.1113  POSTOPERATIVE DIAGNOSIS:    Nuclear sclerotic cataract right eye.     severe stage Primary Open Angle Glaucoma right eye H40.1113  PROCEDURE:  Phacoemusification with posterior chamber intraocular lens placement of the right eye  [place] Dual Blade goniotomy right eye  Ultrasound time: Procedure(s): CATARACT EXTRACTION PHACO AND INTRAOCULAR LENS PLACEMENT (IOC) RIGH K[place]OOK DUAL BLADE GONIOTOMY 5.39 00:26.8 (Right) LENS:  Implant Name Type Inv. Item Serial No. Manufacturer Lot No. LRB No. Used Action  LENS IOL TECNIS EYHANCE 22.0 - [id number] Intraocular Lens LENS IOL TECNIS EYHANCE 22.0 [id number] [employer]  Right 1 Implanted    SURGEON:  Deirdre Evener, MD   ANESTHESIA:  Topical with tetracaine drops augmented with 1% preservative-free intracameral lidocaine.    COMPLICATIONS:  None.   DESCRIPTION OF PROCEDURE:  The patient was identified in the holding room and transported to the operating room and placed in the supine position under the operating microscope.  The right eye was identified as the operative eye and it was prepped and draped in the usual sterile ophthalmic fashion.   A 1 millimeter clear-corneal paracentesis was made at the 12:00 position.  0.5 ml of preservative-free 1% lidocaine was injected into the anterior chamber.  The anterior chamber was filled with Viscoat viscoelastic.  A 2.4 millimeter keratome was used to make a near-clear corneal incision at the 9:00 position. The microscope was adjusted and a gonioprism was used to visulaize the trabecular meshwork.  The [facility] Dual Blade was advanced across the anterior chamber under viscoelastic.  The blade was used to mark the trabecular meshwork at the 1:30 position.  The blade was placed two clock hours clockwise into the meshwork.  Proper postioning was confirmed.  The blade ws passed  counterclockwise through the meshwork to excise approximately two to three clock-hours of trabecular meshwork.   A curvilinear capsulorrhexis was made with a cystotome and capsulorrhexis forceps.  Balanced salt solution was used to hydrodissect and hydrodelineate the nucleus.   Phacoemulsification was then used in stop and chop fashion to remove the lens nucleus and epinucleus.  The remaining cortex was then removed using the irrigation and aspiration handpiece. Provisc was then placed into the capsular bag to distend it for lens placement.  A lens was then injected into the capsular bag.  The remaining viscoelastic was aspirated.   Wounds were hydrated with balanced salt solution.  The anterior chamber was inflated to a physiologic pressure with balanced salt solution.  No wound leaks were noted. Cefuroxime 0.1 ml of a 10mg /ml solution was injected into the anterior chamber for a dose of 1 mg of intracameral antibiotic at the completion of the case.  The patient was taken to the recovery room in stable condition without complications of anesthesia or surgery.

## 2023-01-09 NOTE — Telephone Encounter (Signed)
Patient states he is returning our call.  Patient states he is recovering from eye surgery and will call us when he is ready to have his colonoscopy.

## 2023-01-09 NOTE — Transfer of Care (Signed)
Immediate Anesthesia Transfer of Care Note  Patient: Stephen Madden  Procedure(s) Performed: CATARACT EXTRACTION PHACO AND INTRAOCULAR LENS PLACEMENT (IOC) RIGH [place] DUAL BLADE GONIOTOMY 5.39 00:26.8 (Right)  Patient Location: PACU  Anesthesia Type: MAC  Level of Consciousness: awake, alert  and patient cooperative  Airway and Oxygen Therapy: Patient Spontanous Breathing and Patient connected to supplemental oxygen  Post-op Assessment: Post-op Vital signs reviewed, Patient's Cardiovascular Status Stable, Respiratory Function Stable, Patent Airway and No signs of Nausea or vomiting  Post-op Vital Signs: Reviewed and stable  Complications: No notable events documented.

## 2023-01-09 NOTE — Telephone Encounter (Signed)
LVM to call back to office  

## 2023-01-09 NOTE — H&P (Signed)
[facility]   Primary Care Physician:  Allegra Grana, FNP Ophthalmologist: Dr. Lockie Mola  Pre-Procedure History & Physical: HPI:  Stephen Madden is a 72 y.o. male here for ophthalmic surgery.   Past Medical History:  Diagnosis Date   BPH with obstruction/lower urinary tract symptoms    Cancer [facility])    prostate   Chronic respiratory failure with hypoxia, on home O2 therapy (HCC)    COPD (chronic obstructive pulmonary disease) (HCC)    History of prostate cancer    Hypertension    Pneumothorax 02/23/2018   left   Primary open angle glaucoma    prostate cancer 08/20/2018   Stage 4 very severe COPD by GOLD classification (HCC)    Tobacco abuse    Wears dentures    partial lower    Past Surgical History:  Procedure Laterality Date   CATARACT EXTRACTION W/PHACO Left 12/26/2022   Procedure: CATARACT EXTRACTION PHACO AND INTRAOCULAR LENS PLACEMENT (IOC) LEFT [place] DUAL BLADE GONIOTOMY 3.48 00:29.3;  Surgeon: Lockie Mola, MD;  Location: [facility];  Service: Ophthalmology;  Laterality: Left;   COLON SURGERY     COLONOSCOPY WITH PROPOFOL N/A 05/30/2015   Procedure: COLONOSCOPY WITH PROPOFOL;  Surgeon: Midge Minium, MD;  Location: [facility];  Service: Endoscopy;  Laterality: N/A;   COLONOSCOPY WITH PROPOFOL N/A 08/28/2016   Procedure: COLONOSCOPY WITH PROPOFOL;  Surgeon: Midge Minium, MD;  Location: [facility];  Service: Endoscopy;  Laterality: N/A;   CYSTOSCOPY W/ RETROGRADES Bilateral 07/11/2015   Procedure: CYSTOSCOPY WITH RETROGRADE PYELOGRAM;  Surgeon: Vanna Scotland, MD;  Location: [facility];  Service: Urology;  Laterality: Bilateral;   CYSTOSCOPY WITH STENT PLACEMENT Bilateral 07/11/2015   Procedure: CYSTOSCOPY WITH STENT PLACEMENT;  Surgeon: Vanna Scotland, MD;  Location: [facility];  Service: Urology;  Laterality: Bilateral;   LAPAROSCOPIC PARTIAL COLECTOMY N/A 07/11/2015   Procedure: Laparoscopic right hemicolectomy;  Surgeon:  Gladis Riffle, MD;  Location: [facility];  Service: General;  Laterality: N/A;   POLYPECTOMY  05/30/2015   Procedure: POLYPECTOMY;  Surgeon: Midge Minium, MD;  Location: [facility];  Service: Endoscopy;;   VIDEO ASSISTED THORACOSCOPY (VATS) W/TALC PLEUADESIS Left 05/15/2018   Procedure: VIDEO ASSISTED THORACOSCOPY (VATS) Thea Alken PLEUADESIS;  Surgeon: Hulda Marin, MD;  Location: [facility];  Service: Thoracic;  Laterality: Left;   VOLUME STUDY N/A 07/22/2018   Procedure: VOLUME STUDY;  Surgeon: Riki Altes, MD;  Location: [facility];  Service: Urology;  Laterality: N/A;    Prior to Admission medications   Medication Sig Start Date End Date Taking? Authorizing Provider  atenolol (TENORMIN) 50 MG tablet Take 1 tablet (50 mg total) by mouth daily. 11/07/22  Yes Arnett, Lyn Records, FNP  BREZTRI AEROSPHERE 160-9-4.8 MCG/ACT AERO INHALE 2 PUFFS INTO THE LUNGS IN THE MORNING AND AT BEDTIME. 11/27/22  Yes Salena Saner, MD  brimonidine (ALPHAGAN) 0.2 % ophthalmic solution 1 drop 3 (three) times daily. 03/07/21  Yes [provider]  chlorpheniramine (CHLOR-TRIMETON) 4 MG tablet Take 4 mg by mouth daily.   Yes [provider]  clotrimazole-betamethasone (LOTRISONE) cream Apply 1 Application topically 2 (two) times daily as needed (for irritation). 11/07/22  Yes Arnett, Lyn Records, FNP  dorzolamide (TRUSOPT) 2 % ophthalmic solution 1 drop 2 (two) times daily.   Yes [provider]  Glycerin (OPTASE DRY EYE INTENSE OP) Apply 1 drop to eye in the morning and at bedtime.   Yes [provider]  latanoprost (XALATAN) 0.005 % ophthalmic solution  Place 1 drop into both eyes at bedtime.   Yes [provider]  NIFEdipine (ADALAT CC) 30 MG 24 hr tablet Take 1 tablet (30 mg total) by mouth every morning. 11/07/22  Yes Allegra Grana, FNP  OXYGEN Inhale 2 L into the lungs at bedtime.   Yes [provider]  timolol (BETIMOL) 0.5 % ophthalmic solution  Place 1 drop into both eyes 2 (two) times daily.   Yes [provider]    Allergies as of 12/31/2022   (No Known Allergies)    Family History  Problem Relation Age of Onset   Hypertension Mother    Heart disease Mother    Heart attack Mother 83   Diabetes Father    Hypertension Father    Stroke Father 52   Hypertension Brother    Gout Brother    Cancer Neg Hx    Kidney disease Neg Hx    Prostate cancer Neg Hx    Bladder Cancer Neg Hx     Social History   Socioeconomic History   Marital status: Married    Spouse name: rosa   Number of children: Not on file   Years of education: Not on file   Highest education level: Not on file  Occupational History   Occupation: [employer]    Comment: retired  Tobacco Use   Smoking status: Former    Current packs/day: 0.00    Average packs/day: 1 pack/day for 30.0 years (30.0 ttl pk-yrs)    Types: Cigarettes    Start date: 11/20/1986    Quit date: 11/19/2016    Years since quitting: 6.1   Smokeless tobacco: Never  Vaping Use   Vaping status: Never Used  Substance and Sexual Activity   Alcohol use: Not Currently    Alcohol/week: 0.0 standard drinks of alcohol    Comment: some during week   Drug use: No   Sexual activity: Yes  Other Topics Concern   Not on file  Social History Narrative   Born in [place]   From [place]   Lives with wife , step daughter   Gun in home      He has son in [place]; he has 2 granddaughters         1 brother in [place]   1 brother in [place]   1 sister in [place]   1 brother [place]      1 sister passed      Social Determinants of Health   Financial Resource Strain: Low Risk  (04/20/2021)   Overall Financial Resource Strain (CARDIA)    Difficulty of Paying Living Expenses: Not hard at all  Food Insecurity: No Food Insecurity (04/16/2022)   Hunger Vital Sign    Worried About Running Out of Food in the Last Year: Never true    Ran Out of Food in the Last Year: Never  true  Transportation Needs: No Transportation Needs (04/16/2022)   PRAPARE - Administrator, Civil Service (Medical): No    Lack of Transportation (Non-Medical): No  Physical Activity: Insufficiently Active (04/20/2021)   Exercise Vital Sign    Days of Exercise per Week: 3 days    Minutes of Exercise per Session: 30 min  Stress: No Stress Concern Present (04/20/2021)   [employer] of Occupational Health - Occupational Stress Questionnaire    Feeling of Stress : Only a little  Social Connections: Moderately Integrated (04/20/2021)   Social Connection and Isolation Panel [NHANES]  Frequency of Communication with Friends and Family: More than three times a week    Frequency of Social Gatherings with Friends and Family: More than three times a week    Attends Religious Services: Never    Database administrator or Organizations: Yes    Attends Engineer, structural: More than 4 times per year    Marital Status: Married  Catering manager Violence: Not At Risk (04/20/2021)   Humiliation, Afraid, Rape, and Kick questionnaire    Fear of Current or Ex-Partner: No    Emotionally Abused: No    Physically Abused: No    Sexually Abused: No    Review of Systems: See HPI, otherwise negative ROS  Physical Exam: Ht 6\' 4"  (1.93 m)   Wt 92.5 kg   BMI 24.83 kg/m  General:   Alert,  pleasant and cooperative in NAD Head:  Normocephalic and atraumatic. Lungs:  Clear to auscultation.    Heart:  Regular rate and rhythm.   Impression/Plan: [name] is here for ophthalmic surgery.  Risks, benefits, limitations, and alternatives regarding ophthalmic surgery have been reviewed with the patient.  Questions have been answered.  All parties agreeable.   Lockie Mola, MD  01/09/2023, 8:59 AM

## 2023-01-09 NOTE — Anesthesia Postprocedure Evaluation (Signed)
Anesthesia Post Note  Patient: Stephen Madden  Procedure(s) Performed: CATARACT EXTRACTION PHACO AND INTRAOCULAR LENS PLACEMENT (IOC) RIGH [place] DUAL BLADE GONIOTOMY 5.39 00:26.8 (Right)  Patient location during evaluation: PACU Anesthesia Type: MAC Level of consciousness: awake and alert Pain management: pain level controlled Vital Signs Assessment: post-procedure vital signs reviewed and stable Respiratory status: spontaneous breathing, nonlabored ventilation, respiratory function stable and patient connected to nasal cannula oxygen Cardiovascular status: stable and blood pressure returned to baseline Postop Assessment: no apparent nausea or vomiting Anesthetic complications: no   No notable events documented.   Last Vitals:  Vitals:   01/09/23 1032 01/09/23 1037  BP: 114/84 111/81  Pulse: 60 63  Resp: 12 18  Temp: (!) 36.3 C 36.4 C  SpO2: 97% 94%    Last Pain:  Vitals:   01/09/23 1037  TempSrc:   PainSc: 0-No pain                 Macey Wurtz C Jolicia Delira

## 2023-01-10 ENCOUNTER — Encounter: Payer: Self-pay | Admitting: Ophthalmology

## 2023-01-14 NOTE — Telephone Encounter (Signed)
Noted  

## 2023-01-30 ENCOUNTER — Ambulatory Visit (INDEPENDENT_AMBULATORY_CARE_PROVIDER_SITE_OTHER): Payer: PPO | Admitting: Emergency Medicine

## 2023-01-30 VITALS — Ht 76.0 in | Wt 215.0 lb

## 2023-01-30 DIAGNOSIS — Z0000 Encounter for general adult medical examination without abnormal findings: Secondary | ICD-10-CM

## 2023-01-30 NOTE — Patient Instructions (Addendum)
Stephen Madden , Thank you for taking time to come for your Medicare Wellness Visit. I appreciate your ongoing commitment to your health goals. Please review the following plan we discussed and let me know if I can assist you in the future.   Referrals/Orders/Follow-Ups/Clinician Recommendations: You are past due for your colonoscopy. Recommend making an appointment with GI. You declined a referral today. Get the flu and covid shots in the fall.  This is a list of the screening recommended for you and due dates:  Health Maintenance  Topic Date Due   Hepatitis C Screening  Never done   DTaP/Tdap/Td vaccine (1 - Tdap) Never done   Zoster (Shingles) Vaccine (1 of 2) Never done   Colon Cancer Screening  08/29/2019   COVID-19 Vaccine (3 - Moderna risk series) 05/17/2021   Flu Shot  12/20/2022   Screening for Lung Cancer  07/03/2023   Medicare Annual Wellness Visit  01/30/2024   Pneumonia Vaccine  Completed   HPV Vaccine  Aged Out    Advanced directives: (Declined) Advance directive discussed with you today. Even though you declined this today, please call our office should you change your mind, and we can give you the proper paperwork for you to fill out.  Next Medicare Annual Wellness Visit scheduled for next year: Yes, 02/05/24 @ 12:45pm

## 2023-01-30 NOTE — Progress Notes (Signed)
Subjective:   Stephen Madden is a 72 y.o. male who presents for Medicare Annual/Subsequent preventive examination.  Visit Complete: Virtual  I connected with  [name] on 01/30/23 by a audio enabled telemedicine application and verified that I am speaking with the correct person using two identifiers.  Patient Location: Home  Provider Location: Home Office  I discussed the limitations of evaluation and management by telemedicine. The patient expressed understanding and agreed to proceed.  Vital Signs: Because this visit was a virtual/telehealth visit, some criteria may be missing or patient reported. Any vitals not documented were not able to be obtained and vitals that have been documented are patient reported.    Review of Systems     Cardiac Risk Factors include: advanced age (>38men, >50 women);male gender;hypertension     Objective:    Today's Vitals   01/30/23 1326  Weight: 215 lb (97.5 kg)  Height: 6\' 4"  (1.93 m)   Body mass index is 26.17 kg/m.     01/30/2023    1:45 PM 01/09/2023    8:47 AM 12/26/2022    9:03 AM 03/26/2022   11:33 AM 04/20/2021   10:59 AM 03/15/2020   10:08 AM 09/08/2019    1:42 PM  Advanced Directives  Does Patient Have a Medical Advance Directive? No No No No No No No  Would patient like information on creating a medical advance directive? No - Patient declined No - Patient declined No - Patient declined No - Patient declined No - Patient declined No - Patient declined No - Patient declined    Current Medications (verified) Outpatient Encounter Medications as of 01/30/2023  Medication Sig   atenolol (TENORMIN) 50 MG tablet Take 1 tablet (50 mg total) by mouth daily.   BREZTRI AEROSPHERE 160-9-4.8 MCG/ACT AERO INHALE 2 PUFFS INTO THE LUNGS IN THE MORNING AND AT BEDTIME.   brimonidine (ALPHAGAN) 0.2 % ophthalmic solution 1 drop 3 (three) times daily.   chlorpheniramine (CHLOR-TRIMETON) 4 MG tablet Take 4 mg by mouth daily.    clotrimazole-betamethasone (LOTRISONE) cream Apply 1 Application topically 2 (two) times daily as needed (for irritation).   NIFEdipine (ADALAT CC) 30 MG 24 hr tablet Take 1 tablet (30 mg total) by mouth every morning.   OXYGEN Inhale 2 L into the lungs at bedtime.   dorzolamide (TRUSOPT) 2 % ophthalmic solution 1 drop 2 (two) times daily.   Glycerin (OPTASE DRY EYE INTENSE OP) Apply 1 drop to eye in the morning and at bedtime. (Patient not taking: Reported on 01/30/2023)   latanoprost (XALATAN) 0.005 % ophthalmic solution Place 1 drop into both eyes at bedtime.   timolol (BETIMOL) 0.5 % ophthalmic solution Place 1 drop into both eyes 2 (two) times daily.   No facility-administered encounter medications on file as of 01/30/2023.    Allergies (verified) Patient has no known allergies.   History: Past Medical History:  Diagnosis Date   BPH with obstruction/lower urinary tract symptoms    Cancer [facility])    prostate   Chronic respiratory failure with hypoxia, on home O2 therapy (HCC)    COPD (chronic obstructive pulmonary disease) (HCC)    History of prostate cancer    Hypertension    Pneumothorax 02/23/2018   left   Primary open angle glaucoma    prostate cancer 08/20/2018   Stage 4 very severe COPD by GOLD classification (HCC)    Tobacco abuse    Wears dentures    partial lower   Past Surgical History:  Procedure Laterality Date   CATARACT EXTRACTION W/PHACO Left 12/26/2022   Procedure: CATARACT EXTRACTION PHACO AND INTRAOCULAR LENS PLACEMENT (IOC) LEFT KAHOOK DUAL BLADE GONIOTOMY 3.48 00:29.3;  Surgeon: Lockie Mola, MD;  Location: [facility];  Service: Ophthalmology;  Laterality: Left;   CATARACT EXTRACTION W/PHACO Right 01/09/2023   Procedure: CATARACT EXTRACTION PHACO AND INTRAOCULAR LENS PLACEMENT (IOC) RIGH KAHOOK DUAL BLADE GONIOTOMY 5.39 00:26.8;  Surgeon: Lockie Mola, MD;  Location: [facility];  Service: Ophthalmology;  Laterality: Right;    COLON SURGERY     COLONOSCOPY WITH PROPOFOL N/A 05/30/2015   Procedure: COLONOSCOPY WITH PROPOFOL;  Surgeon: Midge Minium, MD;  Location: [facility];  Service: Endoscopy;  Laterality: N/A;   COLONOSCOPY WITH PROPOFOL N/A 08/28/2016   Procedure: COLONOSCOPY WITH PROPOFOL;  Surgeon: Midge Minium, MD;  Location: [facility];  Service: Endoscopy;  Laterality: N/A;   CYSTOSCOPY W/ RETROGRADES Bilateral 07/11/2015   Procedure: CYSTOSCOPY WITH RETROGRADE PYELOGRAM;  Surgeon: Vanna Scotland, MD;  Location: [facility] ORS;  Service: Urology;  Laterality: Bilateral;   CYSTOSCOPY WITH STENT PLACEMENT Bilateral 07/11/2015   Procedure: CYSTOSCOPY WITH STENT PLACEMENT;  Surgeon: Vanna Scotland, MD;  Location: [facility] OR[facility];  Service: Urology;  Laterality: Bilateral;   LAPAROSCOPIC PARTIAL COLECTOMY N/A 07/11/2015   Procedure: Laparoscopic right hemicolectomy;  Surgeon: Gladis Riffle, MD;  Location: [facility] OR[facility];  Service: General;  Laterality: N/A;   POLYPECTOMY  05/30/2015   Procedure: POLYPECTOMY;  Surgeon: Midge Minium, MD;  Location: [facility];  Service: Endoscopy;;   VIDEO ASSISTED THORACOSCOPY (VATS) W/TALC PLEUADESIS Left 05/15/2018   Procedure: VIDEO ASSISTED THORACOSCOPY (VATS) Thea Alken PLEUADESIS;  Surgeon: Hulda Marin, MD;  Location: [facility] OR[facility];  Service: Thoracic;  Laterality: Left;   VOLUME STUDY N/A 07/22/2018   Procedure: VOLUME STUDY;  Surgeon: Riki Altes, MD;  Location: [facility] OR[facility];  Service: Urology;  Laterality: N/A;   Family History  Problem Relation Age of Onset   Hypertension Mother    Heart disease Mother    Heart attack Mother 49   Diabetes Father    Hypertension Father    Stroke Father 72   Hypertension Brother    Gout Brother    Cancer Neg Hx    Kidney disease Neg Hx    Prostate cancer Neg Hx    Bladder Cancer Neg Hx    Social History   Socioeconomic History   Marital status: Married    Spouse name: Rosa   Number of children: 1   Years of education: Not on file    Highest education level: Not on file  Occupational History   Occupation: [employer]    Comment: retired  Tobacco Use   Smoking status: Former    Current packs/day: 0.00    Average packs/day: 1 pack/day for 30.0 years (30.0 ttl pk-yrs)    Types: Cigarettes    Start date: 11/20/1986    Quit date: 11/19/2016    Years since quitting: 6.2   Smokeless tobacco: Never  Vaping Use   Vaping status: Never Used  Substance and Sexual Activity   Alcohol use: Not Currently    Comment: last use 12/22/22, previous 1-2 shots 2 times a week   Drug use: No   Sexual activity: Yes  Other Topics Concern   Not on file  Social History Narrative   Born in [place]   From [place]   Lives with wife , step daughter   Gun in home      He has son in [place];  he has 2 granddaughters         1 brother in [place]   1 brother in [place]   1 sister in [place]   1 brother [place]      1 sister passed      Social Determinants of Health   Financial Resource Strain: Low Risk  (01/30/2023)   Overall Financial Resource Strain (CARDIA)    Difficulty of Paying Living Expenses: Not hard at all  Food Insecurity: No Food Insecurity (01/30/2023)   Hunger Vital Sign    Worried About Running Out of Food in the Last Year: Never true    Ran Out of Food in the Last Year: Never true  Transportation Needs: No Transportation Needs (01/30/2023)   PRAPARE - Administrator, Civil Service (Medical): No    Lack of Transportation (Non-Medical): No  Physical Activity: Inactive (01/30/2023)   Exercise Vital Sign    Days of Exercise per Week: 0 days    Minutes of Exercise per Session: 0 min  Stress: No Stress Concern Present (01/30/2023)   [employer] of Occupational Health - Occupational Stress Questionnaire    Feeling of Stress : Not at all  Social Connections: Moderately Integrated (01/30/2023)   Social Connection and Isolation Panel [NHANES]    Frequency of Communication with Friends and Family:  More than three times a week    Frequency of Social Gatherings with Friends and Family: Once a week    Attends Religious Services: More than 4 times per year    Active Member of [employer] or Organizations: No    Attends Engineer, structural: Never    Marital Status: Married    Tobacco Counseling Counseling given: Not Answered   Clinical Intake:  Pre-visit preparation completed: Yes  Pain : No/denies pain     BMI - recorded: 26.17 Nutritional Status: BMI 25 -29 Overweight Nutritional Risks: None Diabetes: No  How often do you need to have someone help you when you read instructions, pamphlets, or other written materials from your doctor or pharmacy?: 1 - Never  Interpreter Needed?: No  Information entered by :: Tora Kindred, CMA   Activities of Daily Living    01/30/2023    1:28 PM 01/09/2023    8:48 AM  In your present state of health, do you have any difficulty performing the following activities:  Hearing? 0 0  Vision? 0 0  Difficulty concentrating or making decisions? 0 0  Walking or climbing stairs? 0 0  Dressing or bathing? 0 0  Doing errands, shopping? 0   Preparing Food and eating ? N   Using the Toilet? N   In the past six months, have you accidently leaked urine? N   Do you have problems with loss of bowel control? N   Managing your Medications? N   Managing your Finances? N   Housekeeping or managing your Housekeeping? N     Patient Care Team: Allegra Grana, FNP as PCP - General (Family Medicine) Carmina Miller, MD as Radiation Oncologist (Radiation Oncology) Salena Saner, MD as Consulting Physician (Pulmonary Disease)  Indicate any recent Medical Services you may have received from other than [facility] providers in the past year (date may be approximate).     Assessment:   This is a routine wellness examination for Rashod.  Hearing/Vision screen Hearing Screening - Comments:: Denies hearing loss Vision Screening - Comments:: Gets  routine eye exams   Goals Addressed  This Visit's Progress     Patient Stated (pt-stated)        Increase exercise      Depression Screen    01/30/2023    1:43 PM 11/07/2022   10:45 AM 11/07/2022   10:44 AM 04/20/2021   11:00 AM 04/20/2021   10:57 AM  PHQ 2/9 Scores  PHQ - 2 Score 0 0 0 0 0  PHQ- 9 Score 0 0       Fall Risk    01/30/2023    1:46 PM 11/07/2022   10:44 AM 04/20/2021   11:00 AM 12/15/2019   12:55 PM 05/30/2018    9:07 AM  Fall Risk   Falls in the past year? 0 0  0 0  Comment    Emmi Telephone Survey: data to providers prior to load   Number falls in past yr: 0 0 0    Injury with Fall? 0 0 0    Risk for fall due to : No Fall Risks No Fall Risks No Fall Risks    Follow up Falls prevention discussed Falls evaluation completed       MEDICARE RISK AT HOME: Medicare Risk at Home Any stairs in or around the home?: Yes If so, are there any without handrails?: No Home free of loose throw rugs in walkways, pet beds, electrical cords, etc?: Yes Adequate lighting in your home to reduce risk of falls?: Yes Life alert?: No Use of a cane, walker or w/c?: No Grab bars in the bathroom?: No Shower chair or bench in shower?: No Elevated toilet seat or a handicapped toilet?: No  TIMED UP AND GO:  Was the test performed?  No    Cognitive Function:    04/20/2021   11:02 AM  MMSE - Mini Mental State Exam  Not completed: Unable to complete        01/30/2023    1:47 PM 04/20/2021   11:03 AM  6CIT Screen  What Year? 0 points 0 points  What month? 0 points 0 points  What time? 0 points 0 points  Count back from 20 0 points 0 points  Months in reverse 0 points 0 points  Repeat phrase 0 points 0 points  Total Score 0 points 0 points    Immunizations Immunization History  Administered Date(s) Administered   Fluad Quad(high Dose 65+) 04/17/2021, 03/23/2022   Influenza, High Dose Seasonal PF 06/12/2017   Influenza-Unspecified 03/21/2018   Moderna  SARS-COV2 Booster Vaccination 03/29/2020, 10/07/2020, 04/19/2021   Moderna Sars-Covid-2 Vaccination 06/11/2019, 07/10/2019   PNEUMOCOCCAL CONJUGATE-20 11/07/2022    TDAP status: Due, Education has been provided regarding the importance of this vaccine. Advised may receive this vaccine at local pharmacy or Health Dept. Aware to provide a copy of the vaccination record if obtained from local pharmacy or Health Dept. Verbalized acceptance and understanding.  Flu Vaccine status: Due, Education has been provided regarding the importance of this vaccine. Advised may receive this vaccine at local pharmacy or Health Dept. Aware to provide a copy of the vaccination record if obtained from local pharmacy or Health Dept. Verbalized acceptance and understanding.  Pneumococcal vaccine status: Up to date  Covid-19 vaccine status: Information provided on how to obtain vaccines.   Qualifies for Shingles Vaccine? Yes   Zostavax completed No   Shingrix Completed?: No.    Education has been provided regarding the importance of this vaccine. Patient has been advised to call insurance company to determine out of pocket expense if they have  not yet received this vaccine. Advised may also receive vaccine at local pharmacy or Health Dept. Verbalized acceptance and understanding.  Screening Tests Health Maintenance  Topic Date Due   Hepatitis C Screening  Never done   DTaP/Tdap/Td (1 - Tdap) Never done   Zoster Vaccines- Shingrix (1 of 2) Never done   Colonoscopy  08/29/2019   COVID-19 Vaccine (3 - Moderna risk series) 05/17/2021   INFLUENZA VACCINE  12/20/2022   Lung Cancer Screening  07/03/2023   Medicare Annual Wellness (AWV)  01/30/2024   Pneumonia Vaccine 31+ Years old  Completed   HPV VACCINES  Aged Out    Health Maintenance  Health Maintenance Due  Topic Date Due   Hepatitis C Screening  Never done   DTaP/Tdap/Td (1 - Tdap) Never done   Zoster Vaccines- Shingrix (1 of 2) Never done   Colonoscopy   08/29/2019   COVID-19 Vaccine (3 - Moderna risk series) 05/17/2021   INFLUENZA VACCINE  12/20/2022    Colorectal cancer screening: Type of screening: Colonoscopy. Completed 08/28/16. Repeat every 3 years. Declined referral to GI  Lung Cancer Screening: (Low Dose CT Chest recommended if Age 38-80 years, 20 pack-year currently smoking OR have quit w/in 15years.) does qualify.   Lung Cancer Screening Referral: 07/03/22  Additional Screening:  Hepatitis C Screening: does qualify; Patient refused.  Vision Screening: Recommended annual ophthalmology exams for early detection of glaucoma and other disorders of the eye.  Dental Screening: Recommended annual dental exams for proper oral hygiene   Community Resource Referral / Chronic Care Management: CRR required this visit?  No   CCM required this visit?  No     Plan:     I have personally reviewed and noted the following in the patient's chart:   Medical and social history Use of alcohol, tobacco or illicit drugs  Current medications and supplements including opioid prescriptions. Patient is not currently taking opioid prescriptions. Functional ability and status Nutritional status Physical activity Advanced directives List of other physicians Hospitalizations, surgeries, and ER visits in previous 12 months Vitals Screenings to include cognitive, depression, and falls Referrals and appointments  In addition, I have reviewed and discussed with patient certain preventive protocols, quality metrics, and best practice recommendations. A written personalized care plan for preventive services as well as general preventive health recommendations were provided to patient.     Tora Kindred, CMA   01/30/2023   After Visit Summary: (Pick Up) Due to this being a telephonic visit, with patients personalized plan was offered to patient and patient has requested to Pick up at office.  Nurse Notes:  Patient declined GI referral for  colonoscopy Patient declined shingles vaccine States he might get the flu and covid vaccines this fall. Patient refused Hep C screening

## 2023-02-04 ENCOUNTER — Ambulatory Visit: Payer: PPO | Admitting: Pulmonary Disease

## 2023-02-04 ENCOUNTER — Encounter: Payer: Self-pay | Admitting: Pulmonary Disease

## 2023-02-04 ENCOUNTER — Other Ambulatory Visit
Admission: RE | Admit: 2023-02-04 | Discharge: 2023-02-04 | Disposition: A | Discharge status: Home / Self Care | Payer: PPO | Source: Ambulatory Visit | Attending: Pulmonary Disease | Admitting: Pulmonary Disease

## 2023-02-04 VITALS — BP 122/80 | HR 85 | Temp 98.0°F | Ht 76.0 in | Wt 208.2 lb

## 2023-02-04 DIAGNOSIS — J9611 Chronic respiratory failure with hypoxia: Secondary | ICD-10-CM

## 2023-02-04 DIAGNOSIS — Z87891 Personal history of nicotine dependence: Secondary | ICD-10-CM | POA: Diagnosis not present

## 2023-02-04 DIAGNOSIS — J449 Chronic obstructive pulmonary disease, unspecified: Secondary | ICD-10-CM | POA: Insufficient documentation

## 2023-02-04 NOTE — Patient Instructions (Signed)
We have ordered your blood work.  You can stop by the lab and get it drawn.  Continue using your Breztri 2 puffs twice a day.  Continue your albuterol as needed.  Follow-up in 3 months time call sooner should any new problems arise.

## 2023-02-04 NOTE — Progress Notes (Unsigned)
Subjective:    Patient ID: Stephen Madden, male    DOB: 11-01-1950, 72 y.o.   MRN: [id number]  Patient Care Team: Allegra Grana, FNP as PCP - General (Family Medicine) Carmina Miller, MD as Radiation Oncologist (Radiation Oncology) Salena Saner, MD as Consulting Physician (Pulmonary Disease) Lockie Mola, MD as Referring Physician (Ophthalmology)  Chief Complaint  Patient presents with   Follow-up    Normal SOB. No wheezing. Cough with clear sputum in the morning.    HPI Patient is a 72 year old former smoker with known stage IV COPD and chronic hypoxic respiratory failure, who presents for a follow-up visit from 30 October 2022. At that time he had improved significantly after a bout with pneumonia diagnosed in April.  Chest x-ray performed during that visit showed marked improvement with only minimal scarring remaining from his pneumonia.  He did not require hospitalization was treated as an outpatient.  The patient states that his dyspnea is at baseline and manageable.  This is dyspnea class II-III.  He is compliant with supplemental oxygen and uses it with exertion and during sleep.  He has had no issues with cough, no hemoptysis. No fevers, chills or sweats. Cough is productive of clear sputum in the mornings.  This is unchanged from his baseline.  No hemoptysis.  He does not endorse any other symptomatology.   On reviewing his records he has never had alpha 1 antitrypsin checked.  This was discussed with the patient, recommend drawing alpha-1 phenotype.  Patient is in agreement.  He is compliant with Breztri and as needed albuterol.  DATA 12/29/2020 PFTs: FEV1 0.98 L or 26% predicted, FVC 2.72 L or 56% predicted, FEV1/FVC 36%. No bronchodilator response.  There is air trapping but no hyperinflation.  No reversible component. Diffusion capacity moderately to severely impaired.  Consistent with severe COPD on the basis of emphysema. 06/29/2021 LDCT, lung cancer  screening: Lung RADS 2S, diffuse bronchial wall thickening with moderate centrilobular and paraseptal emphysema consistent with underlying COPD.  Two-vessel coronary artery disease suggested.  07/02/2022 LDCT, lung cancer screening: Lung RADS 2, benign appearance or behavior, continue annual screening.  Severe emphysema.  Coronary artery calcifications as prior. 09/17/2022 chest x-ray PA and lateral: Developing right midlung field pneumonia. 10/30/2022 chest x-ray PA and lateral: Significantly improved right midlung zone pneumonia with minimal residual pneumonia scarring.  Review of Systems A 10 point review of systems was performed and it is as noted above otherwise negative.   Patient Active Problem List   Diagnosis Date Noted   HTN (hypertension) 11/07/2022   COPD (chronic obstructive pulmonary disease) (HCC) 12/21/2019   Nocturnal hypoxia 12/21/2019   Recurrent spontaneous pneumothorax 05/12/2018   Pneumothorax on left 02/24/2018   Hx of colonic polyps    Benign neoplasm of rectosigmoid junction    Polyp of sigmoid colon    History of elevated PSA 08/11/2015   Erectile dysfunction of organic origin 08/11/2015   Acute kidney injury [facility])    S/P partial colectomy 07/11/2015   Health care maintenance    Benign neoplasm of transverse colon    Benign neoplasm of descending colon    Benign neoplasm of sigmoid colon    Rectal polyp    Prostate cancer (HCC) 01/10/2015   BPH with obstruction/lower urinary tract symptoms 01/10/2015    Social History   Tobacco Use   Smoking status: Former    Current packs/day: 0.00    Average packs/day: 1 pack/day for 30.0 years (30.0 ttl pk-yrs)  Types: Cigarettes    Start date: 11/20/1986    Quit date: 11/19/2016    Years since quitting: 6.2   Smokeless tobacco: Never  Substance Use Topics   Alcohol use: Not Currently    Comment: last use 12/22/22, previous 1-2 shots 2 times a week    No Known Allergies  Current Meds  Medication Sig    atenolol (TENORMIN) 50 MG tablet Take 1 tablet (50 mg total) by mouth daily.   BREZTRI AEROSPHERE 160-9-4.8 MCG/ACT AERO INHALE 2 PUFFS INTO THE LUNGS IN THE MORNING AND AT BEDTIME.   brimonidine (ALPHAGAN) 0.2 % ophthalmic solution 1 drop 3 (three) times daily.   chlorpheniramine (CHLOR-TRIMETON) 4 MG tablet Take 4 mg by mouth daily.   clotrimazole-betamethasone (LOTRISONE) cream Apply 1 Application topically 2 (two) times daily as needed (for irritation).   dorzolamide (TRUSOPT) 2 % ophthalmic solution 1 drop 2 (two) times daily.   latanoprost (XALATAN) 0.005 % ophthalmic solution Place 1 drop into both eyes at bedtime.   NIFEdipine (ADALAT CC) 30 MG 24 hr tablet Take 1 tablet (30 mg total) by mouth every morning.   OXYGEN Inhale 2 L into the lungs at bedtime.   timolol (BETIMOL) 0.5 % ophthalmic solution Place 1 drop into both eyes 2 (two) times daily.    Immunization History  Administered Date(s) Administered   Fluad Quad(high Dose 65+) 04/17/2021, 03/23/2022   Influenza, High Dose Seasonal PF 06/12/2017   Influenza-Unspecified 03/21/2018   Moderna SARS-COV2 Booster Vaccination 03/29/2020, 10/07/2020, 04/19/2021   Moderna Sars-Covid-2 Vaccination 06/11/2019, 07/10/2019   PNEUMOCOCCAL CONJUGATE-20 11/07/2022        Objective:     BP 122/80 (BP Location: Right Arm, Cuff Size: Large)   Pulse 85   Temp 98 F (36.7 C)   Ht 6\' 4"  (1.93 m)   Wt 208 lb 3.2 oz (94.4 kg)   SpO2 94%   BMI 25.34 kg/m   SpO2: 94 % O2 Device: None (Room air)  GENERAL: Well-developed, well-nourished gentleman.  No acute distress.  Fully ambulatory, no conversational dyspnea. HEAD: Normocephalic, atraumatic.  EYES: Pupils equal, round, reactive to light.  No scleral icterus.  MOUTH: Oral mucosa moist, no thrush. NECK: Supple. No thyromegaly. Trachea midline. No JVD.  No adenopathy. PULMONARY: Good air entry bilaterally.  Coarse breath sounds, otherwise, no adventitious sounds. CARDIOVASCULAR: S1 and  S2.  Tachycardic rate and regular rhythm.  No rubs, murmurs or gallops heard. ABDOMEN: Benign. MUSCULOSKELETAL: No joint deformity, no clubbing, no edema.  NEUROLOGIC: No focal deficit, no gait disturbance, speech is fluent. SKIN: Intact,warm,dry. PSYCH: Mood and behavior normal.     Assessment & Plan:     ICD-10-CM   1. Stage 4 very severe COPD by GOLD classification (HCC)  J44.9 Alpha-1 antitrypsin phenotype   Appears well compensated at present Continue Breztri Continue as needed albuterol    2. Chronic respiratory failure with hypoxia (HCC)  J96.11    Patient compliant with oxygen therapy Continue oxygen at 2 L/min with activity and sleep    3. Former cigarette smoker  Z87.891    He is enrolled in lung cancer screening No evidence of relapse     Orders Placed This Encounter  Procedures   Alpha-1 antitrypsin phenotype    Standing Status:   Future    Standing Expiration Date:   02/04/2024    Patient appears to be well compensated at present.  Will see him in follow-up in 3 months time he is to contact us prior to that  time should any new difficulties arise.    Gailen Shelter, MD Advanced Bronchoscopy PCCM [facility]    *This note was dictated using voice recognition software/Dragon.  Despite best efforts to proofread, errors can occur which can change the meaning. Any transcriptional errors that result from this process are unintentional and may not be fully corrected at the time of dictation.

## 2023-02-11 LAB — ALPHA-1-ANTITRYPSIN PHENOTYP: A-1 Antitrypsin, Ser: 142 mg/dL (ref 101–187)

## 2023-02-28 ENCOUNTER — Telehealth: Payer: Self-pay | Admitting: Pulmonary Disease

## 2023-02-28 NOTE — Telephone Encounter (Signed)
I spoke with the patient. He received a letter from [employer] regarding his oxygen. It seems like they are needed office notes to say he still needs the O2. We have not received anything from [employer]. Can you reach out to them and see what they are needing? He said he can bring the letter into the office tomorrow if you need him to.

## 2023-02-28 NOTE — Telephone Encounter (Signed)
PT calling for Dr. Timoteo Expose nurse. States he wants to speak about 02 level guidelines. Sever COPD.  [phone] his #  [employer] has supplied him w/02 and they need more 02 guide lines in order to renew his 02. They did they did the same thing last year.   [employer] [phone] Fax [phone] (This is from the letter he got.)  Daytime 02 testing  PT ID # [id number]

## 2023-03-01 NOTE — Telephone Encounter (Signed)
I have faxed the note from 02/04/23 to [employer] and I received confirmation that 8 pages were received successfully

## 2023-04-10 ENCOUNTER — Encounter: Payer: Self-pay | Admitting: Family

## 2023-04-10 ENCOUNTER — Ambulatory Visit: Payer: PPO | Admitting: Family

## 2023-04-10 VITALS — BP 126/78 | HR 81 | Temp 98.1°F | Ht 76.0 in | Wt 205.0 lb

## 2023-04-10 DIAGNOSIS — C61 Malignant neoplasm of prostate: Secondary | ICD-10-CM

## 2023-04-10 DIAGNOSIS — I700 Atherosclerosis of aorta: Secondary | ICD-10-CM | POA: Diagnosis not present

## 2023-04-10 DIAGNOSIS — I159 Secondary hypertension, unspecified: Secondary | ICD-10-CM

## 2023-04-10 DIAGNOSIS — Z0000 Encounter for general adult medical examination without abnormal findings: Secondary | ICD-10-CM

## 2023-04-10 DIAGNOSIS — Z833 Family history of diabetes mellitus: Secondary | ICD-10-CM | POA: Diagnosis not present

## 2023-04-10 DIAGNOSIS — Z1211 Encounter for screening for malignant neoplasm of colon: Secondary | ICD-10-CM

## 2023-04-10 DIAGNOSIS — Z136 Encounter for screening for cardiovascular disorders: Secondary | ICD-10-CM | POA: Diagnosis not present

## 2023-04-10 DIAGNOSIS — Z1159 Encounter for screening for other viral diseases: Secondary | ICD-10-CM

## 2023-04-10 DIAGNOSIS — Z13220 Encounter for screening for lipoid disorders: Secondary | ICD-10-CM | POA: Diagnosis not present

## 2023-04-10 DIAGNOSIS — R21 Rash and other nonspecific skin eruption: Secondary | ICD-10-CM

## 2023-04-10 DIAGNOSIS — J449 Chronic obstructive pulmonary disease, unspecified: Secondary | ICD-10-CM

## 2023-04-10 DIAGNOSIS — Z87891 Personal history of nicotine dependence: Secondary | ICD-10-CM

## 2023-04-10 LAB — COMPREHENSIVE METABOLIC PANEL WITH GFR
ALT: 12 U/L (ref 0–53)
AST: 18 U/L (ref 0–37)
Albumin: 4.7 g/dL (ref 3.5–5.2)
Alkaline Phosphatase: 75 U/L (ref 39–117)
BUN: 19 mg/dL (ref 6–23)
CO2: 31 meq/L (ref 19–32)
Calcium: 9.5 mg/dL (ref 8.4–10.5)
Chloride: 103 meq/L (ref 96–112)
Creatinine, Ser: 1.22 mg/dL (ref 0.40–1.50)
GFR: 58.96 mL/min — ABNORMAL LOW
Glucose, Bld: 104 mg/dL — ABNORMAL HIGH (ref 70–99)
Potassium: 4 meq/L (ref 3.5–5.1)
Sodium: 141 meq/L (ref 135–145)
Total Bilirubin: 0.8 mg/dL (ref 0.2–1.2)
Total Protein: 7 g/dL (ref 6.0–8.3)

## 2023-04-10 LAB — LIPID PANEL
Cholesterol: 225 mg/dL — ABNORMAL HIGH (ref 0–200)
HDL: 56.7 mg/dL
LDL Cholesterol: 149 mg/dL — ABNORMAL HIGH (ref 0–99)
NonHDL: 167.86
Total CHOL/HDL Ratio: 4
Triglycerides: 93 mg/dL (ref 0.0–149.0)
VLDL: 18.6 mg/dL (ref 0.0–40.0)

## 2023-04-10 LAB — CBC WITH DIFFERENTIAL/PLATELET
Basophils Absolute: 0 K/uL (ref 0.0–0.1)
Basophils Relative: 0.5 % (ref 0.0–3.0)
Eosinophils Absolute: 0.1 K/uL (ref 0.0–0.7)
Eosinophils Relative: 1.6 % (ref 0.0–5.0)
HCT: 40.7 % (ref 39.0–52.0)
Hemoglobin: 13.1 g/dL (ref 13.0–17.0)
Lymphocytes Relative: 24.1 % (ref 12.0–46.0)
Lymphs Abs: 1.3 K/uL (ref 0.7–4.0)
MCHC: 32.3 g/dL (ref 30.0–36.0)
MCV: 95.5 fl (ref 78.0–100.0)
Monocytes Absolute: 0.5 K/uL (ref 0.1–1.0)
Monocytes Relative: 9.3 % (ref 3.0–12.0)
Neutro Abs: 3.4 K/uL (ref 1.4–7.7)
Neutrophils Relative %: 64.5 % (ref 43.0–77.0)
Platelets: 203 K/uL (ref 150.0–400.0)
RBC: 4.26 Mil/uL (ref 4.22–5.81)
RDW: 14.2 % (ref 11.5–15.5)
WBC: 5.3 K/uL (ref 4.0–10.5)

## 2023-04-10 LAB — HEMOGLOBIN A1C: Hgb A1c MFr Bld: 6.4 % (ref 4.6–6.5)

## 2023-04-10 LAB — PSA: PSA: 0.65 ng/mL (ref 0.10–4.00)

## 2023-04-10 LAB — TSH: TSH: 0.79 u[IU]/mL (ref 0.35–5.50)

## 2023-04-10 MED ORDER — CLOTRIMAZOLE-BETAMETHASONE 1-0.05 % EX CREA
1.0000 | TOPICAL_CREAM | Freq: Two times a day (BID) | CUTANEOUS | 6 refills | Status: DC | PRN
Start: 2023-04-10 — End: 2023-07-18

## 2023-04-10 NOTE — Progress Notes (Unsigned)
Assessment & Plan:  Rash     Return precautions given.   Risks, benefits, and alternatives of the medications and treatment plan prescribed today were discussed, and patient expressed understanding.   Education regarding symptom management and diagnosis given to patient on AVS either electronically or printed.  No follow-ups on file.  Rennie Plowman, FNP  Subjective:    Patient ID: Stephen Madden, male    DOB: 14-Jul-1950, 72 y.o.   MRN: [id number]  CC: Stephen Madden is a 72 y.o. male who presents today for physical exam.    HPI: HPI Mother had an MI 28.   Colorectal  Cancer Screening: due Prostate Cancer Screening: History of prostate cancer dx 08/2018. S/p radiation only   Lung Cancer Screening: Following with Dr. Jayme Cloud and participating in lung cancer screening program.  Last obtained 07/02/2022.  Of note, coronary artery calcifications, aortic atherosclerosis.   AAA screen for all men and women aged 27 to 34 with h/o tobacco, men 16 years older with family h/o AAA and women 65 years or older who have ever smoked or have family history of AAA.  Immunizations       Tetanus - UTD        Pneumococcal - complete Hepatitis C screening - Candidate for***  Exercise: No regular exercise. Very rarely, he will walk on treadmill. Denies CP.    Alcohol use:  very rare alcohol use.  Smoking/tobacco use: former smoke, quit 2018.    Health Maintenance  Topic Date Due   Hepatitis C Screening  Never done   DTaP/Tdap/Td vaccine (1 - Tdap) Never done   Zoster (Shingles) Vaccine (1 of 2) Never done   Colon Cancer Screening  08/29/2019   COVID-19 Vaccine (3 - Moderna risk series) 05/17/2021   Flu Shot  12/20/2022   Screening for Lung Cancer  07/03/2023   Medicare Annual Wellness Visit  01/30/2024   Pneumonia Vaccine  Completed   HPV Vaccine  Aged Out     ALLERGIES: Patient has no known allergies.  Current Outpatient Medications on File Prior to Visit  Medication Sig Dispense  Refill   atenolol (TENORMIN) 50 MG tablet Take 1 tablet (50 mg total) by mouth daily. 90 tablet 3   BREZTRI AEROSPHERE 160-9-4.8 MCG/ACT AERO INHALE 2 PUFFS INTO THE LUNGS IN THE MORNING AND AT BEDTIME. 10.7 each 11   brimonidine (ALPHAGAN) 0.2 % ophthalmic solution 1 drop 3 (three) times daily.     chlorpheniramine (CHLOR-TRIMETON) 4 MG tablet Take 4 mg by mouth daily.     clotrimazole-betamethasone (LOTRISONE) cream Apply 1 Application topically 2 (two) times daily as needed (for irritation). 30 g 6   dorzolamide (TRUSOPT) 2 % ophthalmic solution 1 drop 2 (two) times daily.     latanoprost (XALATAN) 0.005 % ophthalmic solution Place 1 drop into both eyes at bedtime.     NIFEdipine (ADALAT CC) 30 MG 24 hr tablet Take 1 tablet (30 mg total) by mouth every morning. 90 tablet 3   OXYGEN Inhale 2 L into the lungs at bedtime.     timolol (BETIMOL) 0.5 % ophthalmic solution Place 1 drop into both eyes 2 (two) times daily.     Glycerin (OPTASE DRY EYE INTENSE OP) Apply 1 drop to eye in the morning and at bedtime. (Patient not taking: Reported on 01/30/2023)     No current facility-administered medications on file prior to visit.    Review of Systems    Objective:    BP 126/78  Pulse 81   Temp 98.1 F (36.7 C) (Oral)   Ht 6\' 4"  (1.93 m)   Wt 205 lb (93 kg)   SpO2 95%   BMI 24.95 kg/m   BP Readings from Last 3 Encounters:  04/10/23 126/78  02/04/23 122/80  01/09/23 111/81   Wt Readings from Last 3 Encounters:  04/10/23 205 lb (93 kg)  02/04/23 208 lb 3.2 oz (94.4 kg)  01/30/23 215 lb (97.5 kg)    Physical Exam

## 2023-04-10 NOTE — Patient Instructions (Addendum)
You get tetanus and shingles vaccine at local pharmacy  We gave you the flu vaccine today; [facility].   I have ordered an ultrasound of your abdomen to evaluate for aneurysm in your abdominal due to smoking history and placed a referral to gastroenterology for colonoscopy.    Let us know if you dont hear back within a week in regards to an appointment being scheduled.   So that you are aware, if you are [facility] MyChart user , please pay attention to your MyChart messages as you may receive a MyChart message with a phone number to call and schedule this test/appointment own your own from our referral coordinator. This is a new process so I do not want you to miss this message.  If you are not a MyChart user, you will receive a phone call.      I have ordered a test for your heart,  CT calcium score ( SELF PAY option)  to further stratify your overall cardiovascular risk .   An estimate of cost is $150-200 out-of-pocket as not covered by insurance. However please call your insurance company in advance to ensure no other costs so that you do not have any unexpected bills.     I have placed your order to [facility] in [place] as  generally most convenient.   Phone Number to [facility] on [address] is is [phone] to get scheduled.   Please call to get scheduled and if any issues at all in doing so, please let me know.   Below an article from [facility] regarding the test.   [email or web address].   Exams We Offer: Cardiac CT Calcium Score  Knowing your score could save your life. A cardiac CT calcium score, also known as a coronary calcium scan, is a quick, convenient and noninvasive way of evaluating the amount of calcified (hard) plaque in your heart vessels. The level of calcium  equates to the extent of plaque build-up in your arteries. Plaque in the arteries can cause heart attacks.  The radiologist reads the images and sends your doctor a report with a calcium score. Patients with higher scores have a greater risk for a heart attack, heart disease or stroke. Knowing your score can help your doctor decide on blood pressure and cholesterol goals that will minimize your risk as much as possible.  The [employer] of Cardiology found that Coronary artery calcification (CAC) is an excellent cardiovascular disease risk marker and can help guide the decision to use cholesterol reducing medications such as statins. A negative calcium score may reduce the need for statins in otherwise eligible patients.  The exam takes less than 10 minutes, is painless and does not require any IV or oral contrast. At [facility] locations, the out-of-pocket fee without insurance is $75. At the time of scheduling, please let us know if you want to process the exam through your insurance or self-pay at the rate of $75. Patients who want to self-pay should not submit their insurance card when checking in for the appointment.   Who should get a Cardiac CT Calcium Score: Middle age adults at intermediate risk of heart disease Family history of heart disease Borderline high cholesterol, high blood pressure or diabetes Overweight or physical inactivity Uncertain about taking daily preventive medical therapy  Health Maintenance, Male Adopting a healthy lifestyle and getting preventive care are important in promoting health and wellness. Ask  your health care provider about: The right schedule for you to have regular tests and exams. Things you can do on your own to prevent diseases and keep yourself healthy. What should I know about diet, weight, and exercise? Eat a healthy diet  Eat a diet that includes plenty of vegetables, fruits, low-fat dairy products, and lean protein. Do  not eat a lot of foods that are high in solid fats, added sugars, or sodium. Maintain a healthy weight Body mass index (BMI) is a measurement that can be used to identify possible weight problems. It estimates body fat based on height and weight. Your health care provider can help determine your BMI and help you achieve or maintain a healthy weight. Get regular exercise Get regular exercise. This is one of the most important things you can do for your health. Most adults should: Exercise for at least 150 minutes each week. The exercise should increase your heart rate and make you sweat (moderate-intensity exercise). Do strengthening exercises at least twice a week. This is in addition to the moderate-intensity exercise. Spend less time sitting. Even light physical activity can be beneficial. Watch cholesterol and blood lipids Have your blood tested for lipids and cholesterol at 73 years of age, then have this test every 5 years. You may need to have your cholesterol levels checked more often if: Your lipid or cholesterol levels are high. You are older than 73 years of age. You are at high risk for heart disease. What should I know about cancer screening? Many types of cancers can be detected early and may often be prevented. Depending on your health history and family history, you may need to have cancer screening at various ages. This may include screening for: Colorectal cancer. Prostate cancer. Skin cancer. Lung cancer. What should I know about heart disease, diabetes, and high blood pressure? Blood pressure and heart disease High blood pressure causes heart disease and increases the risk of stroke. This is more likely to develop in people who have high blood pressure readings or are overweight. Talk with your health care provider about your target blood pressure readings. Have your blood pressure checked: Every 3-5 years if you are 44-85 years of age. Every year if you are 61 years old  or older. If you are between the ages of 89 and 53 and are a current or former smoker, ask your health care provider if you should have a one-time screening for abdominal aortic aneurysm (AAA). Diabetes Have regular diabetes screenings. This checks your fasting blood sugar level. Have the screening done: Once every three years after age 22 if you are at a normal weight and have a low risk for diabetes. More often and at a younger age if you are overweight or have a high risk for diabetes. What should I know about preventing infection? Hepatitis B If you have a higher risk for hepatitis B, you should be screened for this virus. Talk with your health care provider to find out if you are at risk for hepatitis B infection. Hepatitis C Blood testing is recommended for: Everyone born from [birth year] through 1966. Anyone with known risk factors for hepatitis C. Sexually transmitted infections (STIs) You should be screened each year for STIs, including gonorrhea and chlamydia, if: You are sexually active and are younger than 73 years of age. You are older than 73 years of age and your health care provider tells you that you are at risk for this type of infection. Your sexual activity  has changed since you were last screened, and you are at increased risk for chlamydia or gonorrhea. Ask your health care provider if you are at risk. Ask your health care provider about whether you are at high risk for HIV. Your health care provider may recommend a prescription medicine to help prevent HIV infection. If you choose to take medicine to prevent HIV, you should first get tested for HIV. You should then be tested every 3 months for as long as you are taking the medicine. Follow these instructions at home: Alcohol use Do not drink alcohol if your health care provider tells you not to drink. If you drink alcohol: Limit how much you have to 0-2 drinks a day. Know how much alcohol is in your drink. In the [place] one  drink equals one 12 oz bottle of beer (355 mL), one 5 oz glass of wine (148 mL), or one 1 oz glass of hard liquor (44 mL). Lifestyle Do not use any products that contain nicotine or tobacco. These products include cigarettes, chewing tobacco, and vaping devices, such as e-cigarettes. If you need help quitting, ask your health care provider. Do not use street drugs. Do not share needles. Ask your health care provider for help if you need support or information about quitting drugs. General instructions Schedule regular health, dental, and eye exams. Stay current with your vaccines. Tell your health care provider if: You often feel depressed. You have ever been abused or do not feel safe at home. Summary Adopting a healthy lifestyle and getting preventive care are important in promoting health and wellness. Follow your health care provider's instructions about healthy diet, exercising, and getting tested or screened for diseases. Follow your health care provider's instructions on monitoring your cholesterol and blood pressure. This information is not intended to replace advice given to you by your health care provider. Make sure you discuss any questions you have with your health care provider. Document Revised: 09/26/2020 Document Reviewed: 09/26/2020 Elsevier Patient Education  2024 [employer].

## 2023-04-11 DIAGNOSIS — Z87891 Personal history of nicotine dependence: Secondary | ICD-10-CM | POA: Insufficient documentation

## 2023-04-11 LAB — HEPATITIS C ANTIBODY: Hepatitis C Ab: NONREACTIVE

## 2023-04-11 NOTE — Assessment & Plan Note (Signed)
Discussed with patient importance of continued CT lung cancer screening program through pulmonology.  I also discussed screening for abdominal aortic aneurysm.  Patient would also like to consider AAA screening and let me know.

## 2023-04-11 NOTE — Assessment & Plan Note (Addendum)
The 10-year ASCVD risk score (Razan Siler DK, et al., 2019) is: 20.2% Lab Results  Component Value Date   LDLCALC 149 (H) 04/10/2023   Discussed ASCVD risk being extremely high.  We also discussed family history of myocardial infarction.  We discussed sedentary lifestyle.  I advised patient to start statin therapy and also to have CT calcium score.  he would like to consider these options and he will let me know.

## 2023-04-11 NOTE — Assessment & Plan Note (Signed)
Patient per dissipating in CT lung cancer screen.  Reiterated the importance of screening for AAA.  Referral for colonoscopy has been placed.  Encouraged exercise.

## 2023-04-12 ENCOUNTER — Other Ambulatory Visit: Payer: Self-pay

## 2023-04-12 ENCOUNTER — Telehealth: Payer: Self-pay

## 2023-04-12 DIAGNOSIS — Z860100 Personal history of colon polyps, unspecified: Secondary | ICD-10-CM

## 2023-04-12 MED ORDER — NA SULFATE-K SULFATE-MG SULF 17.5-3.13-1.6 GM/177ML PO SOLN: 1.0000 | Freq: Once | ORAL | 0 refills | Status: AC

## 2023-04-12 NOTE — Telephone Encounter (Signed)
Gastroenterology Pre-Procedure Review  Request Date: 05/28/23 Requesting Physician: Dr. Servando Snare  PATIENT REVIEW QUESTIONS: The patient responded to the following health history questions as indicated:    1. Are you having any GI issues? no 2. Do you have a personal history of Polyps? yes (last colonoscopy performed by Dr. Servando Snare 08/28/2016 ) 3. Do you have a family history of Colon Cancer or Polyps? no 4. Diabetes Mellitus? no 5. Joint replacements in the past 12 months?no 6. Major health problems in the past 3 months? Cataract surgery in August 2024 7. Any artificial heart valves, MVP, or defibrillator?no    MEDICATIONS & ALLERGIES:    Patient reports the following regarding taking any anticoagulation/antiplatelet therapy:   Plavix, Coumadin, Eliquis, Xarelto, Lovenox, Pradaxa, Brilinta, or Effient? no Aspirin? no  Patient confirms/reports the following medications:  Current Outpatient Medications  Medication Sig Dispense Refill   atenolol (TENORMIN) 50 MG tablet Take 1 tablet (50 mg total) by mouth daily. 90 tablet 3   BREZTRI AEROSPHERE 160-9-4.8 MCG/ACT AERO INHALE 2 PUFFS INTO THE LUNGS IN THE MORNING AND AT BEDTIME. 10.7 each 11   brimonidine (ALPHAGAN) 0.2 % ophthalmic solution 1 drop 3 (three) times daily.     chlorpheniramine (CHLOR-TRIMETON) 4 MG tablet Take 4 mg by mouth daily.     clotrimazole-betamethasone (LOTRISONE) cream Apply 1 Application topically 2 (two) times daily as needed (for irritation). 30 g 6   dorzolamide (TRUSOPT) 2 % ophthalmic solution 1 drop 2 (two) times daily.     Glycerin (OPTASE DRY EYE INTENSE OP) Apply 1 drop to eye in the morning and at bedtime. (Patient not taking: Reported on 01/30/2023)     latanoprost (XALATAN) 0.005 % ophthalmic solution Place 1 drop into both eyes at bedtime.     NIFEdipine (ADALAT CC) 30 MG 24 hr tablet Take 1 tablet (30 mg total) by mouth every morning. 90 tablet 3   OXYGEN Inhale 2 L into the lungs at bedtime.     timolol  (BETIMOL) 0.5 % ophthalmic solution Place 1 drop into both eyes 2 (two) times daily.     No current facility-administered medications for this visit.    Patient confirms/reports the following allergies:  No Known Allergies  No orders of the defined types were placed in this encounter.   AUTHORIZATION INFORMATION Primary Insurance: 1D#: Group #:  Secondary Insurance: 1D#: Group #:  SCHEDULE INFORMATION: Date: 05/28/23 Time: Location: [facility]

## 2023-04-24 ENCOUNTER — Encounter: Payer: Self-pay | Admitting: Family

## 2023-04-24 ENCOUNTER — Ambulatory Visit
Admission: RE | Admit: 2023-04-24 | Discharge: 2023-04-24 | Disposition: A | Discharge status: Home / Self Care | Payer: PPO | Source: Ambulatory Visit | Attending: Family | Admitting: Family

## 2023-04-24 ENCOUNTER — Ambulatory Visit (INDEPENDENT_AMBULATORY_CARE_PROVIDER_SITE_OTHER): Payer: PPO | Admitting: Family

## 2023-04-24 VITALS — BP 128/76 | HR 73 | Temp 98.2°F | Ht 76.0 in | Wt 206.6 lb

## 2023-04-24 DIAGNOSIS — Z87891 Personal history of nicotine dependence: Secondary | ICD-10-CM | POA: Insufficient documentation

## 2023-04-24 DIAGNOSIS — I700 Atherosclerosis of aorta: Secondary | ICD-10-CM | POA: Diagnosis not present

## 2023-04-24 DIAGNOSIS — Z136 Encounter for screening for cardiovascular disorders: Secondary | ICD-10-CM

## 2023-04-24 NOTE — Progress Notes (Signed)
Assessment & Plan:  Aortic atherosclerosis [facility]) Assessment & Plan: The 10-year ASCVD risk score (Stephen Madden, et al., 2019) is: 20.8% Lab Results  Component Value Date   LDLCALC 149 (H) 04/10/2023   Based on CT calcium score of 1-99, advised patient statin to achieve LDL goal less than 70.      Return precautions given.   Risks, benefits, and alternatives of the medications and treatment plan prescribed today were discussed, and patient expressed understanding.   Education regarding symptom management and diagnosis given to patient on AVS either electronically or printed.  Return in about 6 months (around 10/23/2023).  Rennie Plowman, FNP  Subjective:    Patient ID: Stephen Madden, male    DOB: 05/12/1951, 72 y.o.   MRN: [id number]  CC: Stephen Madden is a 72 y.o. male who presents today for follow up.   HPI: Here to discussed elevated cholesterol, statin medication.  Feels well today.  No new complaints       History of prediabetes  Colonoscopy is scheduled He had CT calcium score and AAA screen today ( results pending)  Allergies: Patient has no known allergies. Current Outpatient Medications on File Prior to Visit  Medication Sig Dispense Refill   atenolol (TENORMIN) 50 MG tablet Take 1 tablet (50 mg total) by mouth daily. 90 tablet 3   BREZTRI AEROSPHERE 160-9-4.8 MCG/ACT AERO INHALE 2 PUFFS INTO THE LUNGS IN THE MORNING AND AT BEDTIME. 10.7 each 11   brimonidine (ALPHAGAN) 0.2 % ophthalmic solution 1 drop 3 (three) times daily.     chlorpheniramine (CHLOR-TRIMETON) 4 MG tablet Take 4 mg by mouth daily.     clotrimazole-betamethasone (LOTRISONE) cream Apply 1 Application topically 2 (two) times daily as needed (for irritation). 30 g 6   dorzolamide (TRUSOPT) 2 % ophthalmic solution 1 drop 2 (two) times daily.     Glycerin (OPTASE DRY EYE INTENSE OP) Apply 1 drop to eye in the morning and at bedtime. (Patient not taking: Reported on 01/30/2023)     latanoprost  (XALATAN) 0.005 % ophthalmic solution Place 1 drop into both eyes at bedtime.     NIFEdipine (ADALAT CC) 30 MG 24 hr tablet Take 1 tablet (30 mg total) by mouth every morning. 90 tablet 3   OXYGEN Inhale 2 L into the lungs at bedtime.     timolol (BETIMOL) 0.5 % ophthalmic solution Place 1 drop into both eyes 2 (two) times daily.     No current facility-administered medications on file prior to visit.    Review of Systems  Constitutional:  Negative for chills and fever.  Respiratory:  Negative for cough.   Cardiovascular:  Negative for chest pain and palpitations.  Gastrointestinal:  Negative for nausea and vomiting.      Objective:    BP 128/76   Pulse 73   Temp 98.2 F (36.8 C) (Oral)   Ht 6\' 4"  (1.93 m)   Wt 206 lb 9.6 oz (93.7 kg)   SpO2 97%   BMI 25.15 kg/m  BP Readings from Last 3 Encounters:  04/24/23 128/76  04/10/23 126/78  02/04/23 122/80   Wt Readings from Last 3 Encounters:  04/24/23 206 lb 9.6 oz (93.7 kg)  04/10/23 205 lb (93 kg)  02/04/23 208 lb 3.2 oz (94.4 kg)    Physical Exam Vitals reviewed.  Constitutional:      Appearance: He is well-developed.  Cardiovascular:     Rate and Rhythm: Regular rhythm.     Heart sounds:  Normal heart sounds.  Pulmonary:     Effort: Pulmonary effort is normal. No respiratory distress.     Breath sounds: Normal breath sounds. No wheezing, rhonchi or rales.  Skin:    General: Skin is warm and dry.  Neurological:     Mental Status: He is alert.  Psychiatric:        Speech: Speech normal.        Behavior: Behavior normal.

## 2023-04-25 ENCOUNTER — Telehealth: Payer: Self-pay

## 2023-04-25 NOTE — Telephone Encounter (Signed)
Prep was sent to [facility] on 04/12/23.  Thanks, [place], [place]

## 2023-04-25 NOTE — Telephone Encounter (Signed)
The patient called in wanting to know his appointment date and time. I advised him to call endo the day before and I gave him the number. [facility] [address] [place], [place], [place] [address]. He has question about his prep.

## 2023-04-26 NOTE — Assessment & Plan Note (Addendum)
The 10-year ASCVD risk score (Airyana Sprunger DK, et al., 2019) is: 20.8% Lab Results  Component Value Date   LDLCALC 149 (H) 04/10/2023   Based on CT calcium score of 1-99, advised patient statin to achieve LDL goal less than 70.

## 2023-04-29 ENCOUNTER — Other Ambulatory Visit: Payer: Self-pay

## 2023-04-29 DIAGNOSIS — I700 Atherosclerosis of aorta: Secondary | ICD-10-CM

## 2023-04-29 MED ORDER — ATORVASTATIN CALCIUM 10 MG PO TABS: 10.0000 mg | ORAL_TABLET | Freq: Every day | ORAL | 3 refills | Status: DC

## 2023-05-07 ENCOUNTER — Telehealth: Payer: Self-pay

## 2023-05-07 ENCOUNTER — Ambulatory Visit: Payer: PPO

## 2023-05-07 NOTE — Telephone Encounter (Signed)
-----   Message from Rennie Plowman sent at 04/11/2023 11:37 AM EST ----- Call pt Last influenza vaccine 03/23/22 with Dr Jayme Cloud. He is due to flu vaccine high dose this season. Schedule here if he would like

## 2023-05-07 NOTE — Telephone Encounter (Signed)
Pt is on nurse visit schedule for 05/08/23 for High Dose Flu vaccine

## 2023-05-08 ENCOUNTER — Ambulatory Visit (INDEPENDENT_AMBULATORY_CARE_PROVIDER_SITE_OTHER): Payer: PPO

## 2023-05-08 DIAGNOSIS — Z23 Encounter for immunization: Secondary | ICD-10-CM | POA: Diagnosis not present

## 2023-05-08 NOTE — Progress Notes (Signed)
Patient presented for High Dose Flu vaccine to left deltoid, patient voiced no concerns nor showed any signs of distress during injection

## 2023-05-10 ENCOUNTER — Encounter: Payer: Self-pay | Admitting: Pulmonary Disease

## 2023-05-10 ENCOUNTER — Ambulatory Visit: Payer: PPO | Admitting: Pulmonary Disease

## 2023-05-10 VITALS — BP 134/78 | HR 86 | Temp 98.0°F | Ht 76.0 in | Wt 213.0 lb

## 2023-05-10 DIAGNOSIS — Z87891 Personal history of nicotine dependence: Secondary | ICD-10-CM | POA: Diagnosis not present

## 2023-05-10 DIAGNOSIS — J9611 Chronic respiratory failure with hypoxia: Secondary | ICD-10-CM

## 2023-05-10 DIAGNOSIS — J449 Chronic obstructive pulmonary disease, unspecified: Secondary | ICD-10-CM | POA: Diagnosis not present

## 2023-05-10 MED ORDER — BREZTRI AEROSPHERE 160-9-4.8 MCG/ACT IN AERO: 2.0000 | INHALATION_SPRAY | Freq: Two times a day (BID) | RESPIRATORY_TRACT | 0 refills | Status: AC

## 2023-05-10 NOTE — Patient Instructions (Signed)
VISIT SUMMARY:  During today's visit, we discussed your overall good health and addressed your concerns about occasional lightheadedness and the cost of your COPD medication. We also reviewed your current treatment plan and made some adjustments to ensure you have enough medication during the holidays.  YOUR PLAN:  -CHRONIC OBSTRUCTIVE PULMONARY DISEASE (COPD): COPD is a chronic lung disease that makes it hard to breathe. Your condition is well-managed with daily use of Breztri and supplemental oxygen as needed. We discussed the cost issues with Breztri, and I provided you with samples to avoid shortages during the holidays. Please remember to use Breztri every night and continue using your oxygen as needed.  -ORTHOSTATIC HYPOTENSION: Orthostatic hypotension is a condition where your blood pressure drops when you stand up, causing dizziness. To manage this, I advised you to stay well hydrated and move slowly when standing up.  -GENERAL HEALTH MAINTENANCE: You received your flu shot on Wednesday, which is important for preventing the flu and maintaining your overall health.  INSTRUCTIONS:  Please schedule a follow-up appointment in 3-4 months to monitor your COPD and overall health.

## 2023-05-10 NOTE — Progress Notes (Signed)
Subjective:    Patient ID: Stephen Madden, male    DOB: 05-15-1951, 72 y.o.   MRN: [id number]  Patient Care Team: Allegra Grana, FNP as PCP - General (Family Medicine) Carmina Miller, MD as Radiation Oncologist (Radiation Oncology) Salena Saner, MD as Consulting Physician (Pulmonary Disease) Lockie Mola, MD as Referring Physician (Ophthalmology)  Chief Complaint  Patient presents with   Follow-up    SOB with exertion.     BACKGROUND/INTERVAL:Patient is a 72 year old former smoker with known stage IV COPD and chronic hypoxic respiratory failure, who presents for a follow-up visit from 04 February 2023.  He has had no exacerbations in the interim.  HPI Discussed the use of AI scribe software for clinical note transcription with the patient, who gave verbal consent to proceed.  History of Present Illness   The patient, with a known history of respiratory disease requiring Breztri and supplemental oxygen, reports overall good health with no new issues since the last visit. He does note occasional lightheadedness upon standing up too quickly, which he manages by moving slowly and staying well hydrated.  The patient continues to use Breztri, which he finds effective, but he expresses concerns about the cost, particularly during the "donut hole" period of his insurance. He reports daily use of supplemental oxygen, especially when exerting himself, such as during visits to the clinic.  He is also compliant with oxygen nocturnally.    He does not endorse the need for a rescue inhaler, stating that it seemed to worsen his condition. He occasionally forgets to use the Breztri at night but ensures to have his oxygen on. The patient recently received his flu vaccine and has no other new medications or changes to his regimen.  He has not had any exacerbations since his last visit.  Overall he feels well and looks well.     DATA 12/29/2020 PFTs: FEV1 0.98 L or 26% predicted, FVC  2.72 L or 56% predicted, FEV1/FVC 36%. No bronchodilator response.  There is air trapping but no hyperinflation.  No reversible component. Diffusion capacity moderately to severely impaired.  Consistent with severe COPD on the basis of emphysema. 06/29/2021 LDCT, lung cancer screening: Lung RADS 2S, diffuse bronchial wall thickening with moderate centrilobular and paraseptal emphysema consistent with underlying COPD.  Two-vessel coronary artery disease suggested.  07/02/2022 LDCT, lung cancer screening: Lung RADS 2, benign appearance or behavior, continue annual screening.  Severe emphysema.  Coronary artery calcifications as prior. 09/17/2022 chest x-ray PA and lateral: Developing right midlung field pneumonia. 10/30/2022 chest x-ray PA and lateral: Significantly improved right midlung zone pneumonia with minimal residual pneumonia scarring.  Review of Systems A 10 point review of systems was performed and it is as noted above otherwise negative.   Patient Active Problem List   Diagnosis Date Noted   Smoking history 04/11/2023   Aortic atherosclerosis (HCC) 04/10/2023   HTN (hypertension) 11/07/2022   COPD (chronic obstructive pulmonary disease) (HCC) 12/21/2019   Nocturnal hypoxia 12/21/2019   Recurrent spontaneous pneumothorax 05/12/2018   Pneumothorax on left 02/24/2018   Hx of colonic polyps    Benign neoplasm of rectosigmoid junction    Polyp of sigmoid colon    History of elevated PSA 08/11/2015   Erectile dysfunction of organic origin 08/11/2015   Acute kidney injury [facility])    S/P partial colectomy 07/11/2015   Health care maintenance    Benign neoplasm of transverse colon    Benign neoplasm of descending colon    Benign neoplasm  of sigmoid colon    Rectal polyp    Prostate cancer (HCC) 01/10/2015   BPH with obstruction/lower urinary tract symptoms 01/10/2015    Social History   Tobacco Use   Smoking status: Former    Current packs/day: 0.00    Average packs/day: 1  pack/day for 30.0 years (30.0 ttl pk-yrs)    Types: Cigarettes    Start date: 11/20/1986    Quit date: 11/19/2016    Years since quitting: 6.4   Smokeless tobacco: Never  Substance Use Topics   Alcohol use: Not Currently    Comment: very rare use , 03/2023; last use 12/22/22, previous 1-2 shots 2 times a week    No Known Allergies  Current Meds  Medication Sig   atenolol (TENORMIN) 50 MG tablet Take 1 tablet (50 mg total) by mouth daily.   atorvastatin (LIPITOR) 10 MG tablet Take 1 tablet (10 mg total) by mouth daily.   BREZTRI AEROSPHERE 160-9-4.8 MCG/ACT AERO INHALE 2 PUFFS INTO THE LUNGS IN THE MORNING AND AT BEDTIME.   brimonidine (ALPHAGAN) 0.2 % ophthalmic solution 1 drop 3 (three) times daily.   Budeson-Glycopyrrol-Formoterol (BREZTRI AEROSPHERE) 160-9-4.8 MCG/ACT AERO Inhale 2 puffs into the lungs in the morning and at bedtime.   chlorpheniramine (CHLOR-TRIMETON) 4 MG tablet Take 4 mg by mouth daily.   clotrimazole-betamethasone (LOTRISONE) cream Apply 1 Application topically 2 (two) times daily as needed (for irritation).   dorzolamide (TRUSOPT) 2 % ophthalmic solution 1 drop 2 (two) times daily.   Glycerin (OPTASE DRY EYE INTENSE OP) Apply 1 drop to eye in the morning and at bedtime.   latanoprost (XALATAN) 0.005 % ophthalmic solution Place 1 drop into both eyes at bedtime.   NIFEdipine (ADALAT CC) 30 MG 24 hr tablet Take 1 tablet (30 mg total) by mouth every morning.   OXYGEN Inhale 2 L into the lungs at bedtime.   timolol (BETIMOL) 0.5 % ophthalmic solution Place 1 drop into both eyes 2 (two) times daily.    Immunization History  Administered Date(s) Administered   Fluad Quad(high Dose 65+) 04/17/2021, 03/23/2022   Fluad Trivalent(High Dose 65+) 05/08/2023   Influenza, High Dose Seasonal PF 06/12/2017   Influenza-Unspecified 03/21/2018   Moderna Covid-19 Fall Seasonal Vaccine 10yrs & older 04/11/2023   Moderna SARS-COV2 Booster Vaccination 03/29/2020, 10/07/2020, 04/19/2021    Moderna Sars-Covid-2 Vaccination 06/11/2019, 07/10/2019   PNEUMOCOCCAL CONJUGATE-20 11/07/2022      Objective:     BP 134/78 (BP Location: Left Arm, Cuff Size: Normal)   Pulse 86   Temp 98 F (36.7 C) (Oral)   Ht 6\' 4"  (1.93 m)   Wt 213 lb (96.6 kg)   SpO2 98%   BMI 25.93 kg/m   SpO2: 98 % on room air at rest  GENERAL: Well-developed, well-nourished gentleman.  No acute distress.  Fully ambulatory, no conversational dyspnea. HEAD: Normocephalic, atraumatic.  EYES: Pupils equal, round, reactive to light.  No scleral icterus.  MOUTH: Oral mucosa moist, no thrush. NECK: Supple. No thyromegaly. Trachea midline. No JVD.  No adenopathy. PULMONARY: Good air entry bilaterally.  Coarse breath sounds, otherwise, no adventitious sounds. CARDIOVASCULAR: S1 and S2.  Tachycardic rate and regular rhythm.  No rubs, murmurs or gallops heard. ABDOMEN: Benign. MUSCULOSKELETAL: No joint deformity, no clubbing, no edema.  NEUROLOGIC: No focal deficit, no gait disturbance, speech is fluent. SKIN: Intact,warm,dry. PSYCH: Mood and behavior normal.    Assessment & Plan:     ICD-10-CM   1. Stage 4 very severe COPD by  GOLD classification (HCC)  J44.9     2. Chronic respiratory failure with hypoxia (HCC)  J96.11     3. Former cigarette smoker  Z87.891      Meds ordered this encounter  Medications   Budeson-Glycopyrrol-Formoterol (BREZTRI AEROSPHERE) 160-9-4.8 MCG/ACT AERO    Sig: Inhale 2 puffs into the lungs in the morning and at bedtime.    Dispense:  11.8 g    Refill:  0    Lot Number?:   [id number]    Expiration Date?:   06/21/2025    Manufacturer?:   [employer] [[id number]]    Quantity:   2   Discussion:    Chronic Obstructive Pulmonary Disease (COPD) Well-managed with daily Breztri and oxygen therapy. No recent exacerbations or use of rescue inhaler due to adverse effects. Occasionally forgets nighttime Breztri. Clear lung sounds on exam. Discussed cost issues with Breztri and provided  samples to avoid shortages during holidays. - Provide Breztri samples to avoid shortages during holidays - Schedule follow-up in 3-4 months  Orthostatic Hypotension Experiences dizziness upon standing, likely due to orthostatic hypotension. Advised to maintain hydration. - Advise to maintain hydration - Follows with primary care for this issue  General Health Maintenance Received flu vaccine on Wednesday. - Documented flu shot administration.      Advised if symptoms do not improve or worsen, to please contact office for sooner follow up or seek emergency care.    I spent 20 minutes of dedicated to the care of this patient on the date of this encounter to include pre-visit review of records, face-to-face time with the patient discussing conditions above, post visit ordering of testing, clinical documentation with the electronic health record, making appropriate referrals as documented, and communicating necessary findings to members of the patients care team.     C. Danice Goltz, MD Advanced Bronchoscopy PCCM [facility]    *This note was generated using voice recognition software/Dragon and/or AI transcription program.  Despite best efforts to proofread, errors can occur which can change the meaning. Any transcriptional errors that result from this process are unintentional and may not be fully corrected at the time of dictation.

## 2023-05-16 ENCOUNTER — Other Ambulatory Visit: Payer: Self-pay | Admitting: Acute Care

## 2023-05-16 DIAGNOSIS — Z122 Encounter for screening for malignant neoplasm of respiratory organs: Secondary | ICD-10-CM

## 2023-05-16 DIAGNOSIS — Z87891 Personal history of nicotine dependence: Secondary | ICD-10-CM

## 2023-05-21 ENCOUNTER — Encounter: Payer: Self-pay | Admitting: Gastroenterology

## 2023-05-28 ENCOUNTER — Ambulatory Visit: Payer: PPO | Admitting: Certified Registered"

## 2023-05-28 ENCOUNTER — Other Ambulatory Visit: Payer: Self-pay

## 2023-05-28 ENCOUNTER — Ambulatory Visit
Admission: RE | Admit: 2023-05-28 | Discharge: 2023-05-28 | Disposition: A | Discharge status: Home / Self Care | Payer: PPO | Attending: Gastroenterology | Admitting: Gastroenterology

## 2023-05-28 ENCOUNTER — Encounter: Payer: Self-pay | Admitting: Gastroenterology

## 2023-05-28 ENCOUNTER — Encounter
Admission: RE | Disposition: A | Discharge status: Home / Self Care | Payer: Self-pay | Source: Home / Self Care | Attending: Gastroenterology

## 2023-05-28 DIAGNOSIS — Z9981 Dependence on supplemental oxygen: Secondary | ICD-10-CM | POA: Diagnosis not present

## 2023-05-28 DIAGNOSIS — Z860100 Personal history of colon polyps, unspecified: Secondary | ICD-10-CM

## 2023-05-28 DIAGNOSIS — I129 Hypertensive chronic kidney disease with stage 1 through stage 4 chronic kidney disease, or unspecified chronic kidney disease: Secondary | ICD-10-CM | POA: Diagnosis not present

## 2023-05-28 DIAGNOSIS — J449 Chronic obstructive pulmonary disease, unspecified: Secondary | ICD-10-CM | POA: Insufficient documentation

## 2023-05-28 DIAGNOSIS — N189 Chronic kidney disease, unspecified: Secondary | ICD-10-CM | POA: Diagnosis not present

## 2023-05-28 DIAGNOSIS — Z8546 Personal history of malignant neoplasm of prostate: Secondary | ICD-10-CM | POA: Insufficient documentation

## 2023-05-28 DIAGNOSIS — K529 Noninfective gastroenteritis and colitis, unspecified: Secondary | ICD-10-CM | POA: Insufficient documentation

## 2023-05-28 DIAGNOSIS — D123 Benign neoplasm of transverse colon: Secondary | ICD-10-CM | POA: Diagnosis not present

## 2023-05-28 DIAGNOSIS — K641 Second degree hemorrhoids: Secondary | ICD-10-CM | POA: Diagnosis not present

## 2023-05-28 DIAGNOSIS — Z1211 Encounter for screening for malignant neoplasm of colon: Secondary | ICD-10-CM | POA: Diagnosis not present

## 2023-05-28 DIAGNOSIS — Z87891 Personal history of nicotine dependence: Secondary | ICD-10-CM | POA: Diagnosis not present

## 2023-05-28 DIAGNOSIS — Z980 Intestinal bypass and anastomosis status: Secondary | ICD-10-CM | POA: Diagnosis not present

## 2023-05-28 DIAGNOSIS — K635 Polyp of colon: Secondary | ICD-10-CM | POA: Insufficient documentation

## 2023-05-28 HISTORY — PX: BIOPSY: SHX5522

## 2023-05-28 HISTORY — PX: COLONOSCOPY WITH PROPOFOL: SHX5780

## 2023-05-28 HISTORY — PX: POLYPECTOMY: SHX5525

## 2023-05-28 SURGERY — COLONOSCOPY WITH PROPOFOL
Anesthesia: General

## 2023-05-28 MED ORDER — PROPOFOL 500 MG/50ML IV EMUL
INTRAVENOUS | Status: DC | PRN
  Administered 2023-05-28: 150 ug/kg/min via INTRAVENOUS

## 2023-05-28 MED ORDER — LIDOCAINE HCL (CARDIAC) PF 100 MG/5ML IV SOSY
PREFILLED_SYRINGE | INTRAVENOUS | Status: DC | PRN
  Administered 2023-05-28: 50 mg via INTRAVENOUS

## 2023-05-28 MED ORDER — PROPOFOL 10 MG/ML IV BOLUS
INTRAVENOUS | Status: DC | PRN
  Administered 2023-05-28: 50 mg via INTRAVENOUS

## 2023-05-28 MED ORDER — SODIUM CHLORIDE 0.9 % IV SOLN: INTRAVENOUS | Status: DC

## 2023-05-28 NOTE — OR Nursing (Signed)
 Colonoscopy extent reached at 1049

## 2023-05-28 NOTE — Transfer of Care (Signed)
 Immediate Anesthesia Transfer of Care Note  Patient: Stephen Madden  Procedure(s) Performed: COLONOSCOPY WITH PROPOFOL  BIOPSY POLYPECTOMY  Patient Location: PACU and Endoscopy Unit  Anesthesia Type:General  Level of Consciousness: awake  Airway & Oxygen  Therapy: Patient Spontanous Breathing and Patient connected to nasal cannula oxygen   Post-op Assessment: Report given to RN and Post -op Vital signs reviewed and stable  Post vital signs: Reviewed and stable  Last Vitals:  Vitals Value Taken Time  BP 93/74 05/28/23 1104  Temp 35.7 C 05/28/23 1100  Pulse 85 05/28/23 1105  Resp 15 05/28/23 1105  SpO2 95 % 05/28/23 1105  Vitals shown include unfiled device data.  Last Pain:  Vitals:   05/28/23 1100  TempSrc: Temporal  PainSc:          Complications: No notable events documented.

## 2023-05-28 NOTE — Anesthesia Postprocedure Evaluation (Signed)
 Anesthesia Post Note  Patient: Stephen Madden  Procedure(s) Performed: COLONOSCOPY WITH PROPOFOL  BIOPSY POLYPECTOMY  Patient location during evaluation: PACU Anesthesia Type: General Level of consciousness: awake and alert, oriented and patient cooperative Pain management: pain level controlled Vital Signs Assessment: post-procedure vital signs reviewed and stable Respiratory status: spontaneous breathing, nonlabored ventilation and respiratory function stable Cardiovascular status: blood pressure returned to baseline and stable Postop Assessment: adequate PO intake Anesthetic complications: no   No notable events documented.   Last Vitals:  Vitals:   05/28/23 1120 05/28/23 1130  BP: 117/84 124/77  Pulse:    Resp:    Temp:    SpO2:      Last Pain:  Vitals:   05/28/23 1130  TempSrc:   PainSc: 0-No pain                 Alfonso Ruths

## 2023-05-28 NOTE — Anesthesia Preprocedure Evaluation (Signed)
 Anesthesia Evaluation  Patient identified by MRN, date of birth, ID band Patient awake    Reviewed: Allergy & Precautions, NPO status , Patient's Chart, lab work & pertinent test results  History of Anesthesia Complications Negative for: history of anesthetic complications  Airway Mallampati: I   Neck ROM: Full    Dental  (+) Missing   Pulmonary COPD (on 2L home O2 at night and PRN), former smoker (quit 2018)   Pulmonary exam normal breath sounds clear to auscultation       Cardiovascular hypertension, Normal cardiovascular exam Rhythm:Regular Rate:Normal     Neuro/Psych negative neurological ROS     GI/Hepatic negative GI ROS,,,  Endo/Other  negative endocrine ROS    Renal/GU Renal disease (CKD)   Prostate CA    Musculoskeletal   Abdominal   Peds  Hematology negative hematology ROS (+)   Anesthesia Other Findings   Reproductive/Obstetrics                             Anesthesia Physical Anesthesia Plan  ASA: 3  Anesthesia Plan: General   Post-op Pain Management:    Induction: Intravenous  PONV Risk Score and Plan: 2 and Propofol  infusion, TIVA and Treatment may vary due to age or medical condition  Airway Management Planned: Natural Airway  Additional Equipment:   Intra-op Plan:   Post-operative Plan:   Informed Consent: I have reviewed the patients History and Physical, chart, labs and discussed the procedure including the risks, benefits and alternatives for the proposed anesthesia with the patient or authorized representative who has indicated his/her understanding and acceptance.       Plan Discussed with: CRNA  Anesthesia Plan Comments: (LMA/GETA backup discussed.  Patient consented for risks of anesthesia including but not limited to:  - adverse reactions to medications - damage to eyes, teeth, lips or other oral mucosa - nerve damage due to positioning  -  sore throat or hoarseness - damage to heart, brain, nerves, lungs, other parts of body or loss of life  Informed patient about role of CRNA in peri- and intra-operative care.  Patient voiced understanding.)       Anesthesia Quick Evaluation

## 2023-05-28 NOTE — Anesthesia Procedure Notes (Signed)
 Procedure Name: MAC Date/Time: 05/28/2023 10:36 AM  Performed by: Germaine Maeola CROME, CRNAPre-anesthesia Checklist: Patient identified, Emergency Drugs available, Suction available, Patient being monitored and Timeout performed Patient Re-evaluated:Patient Re-evaluated prior to induction Oxygen  Delivery Method: Nasal cannula Induction Type: IV induction Placement Confirmation: positive ETCO2 and CO2 detector

## 2023-05-28 NOTE — H&P (Signed)
 Stephen Copping, MD [facility] [address] [place], [place] [address] Phone:[phone] Fax : [phone]  Primary Care Physician:  Dineen Rollene MATSU, FNP Primary Gastroenterologist:  Dr. Copping  Pre-Procedure History & Physical: HPI:  Stephen Madden is a 73 y.o. male is here for an colonoscopy.   Past Medical History:  Diagnosis Date   BPH with obstruction/lower urinary tract symptoms    Cancer [facility])    prostate   Chronic respiratory failure with hypoxia, on home O2 therapy (HCC)    COPD (chronic obstructive pulmonary disease) (HCC)    History of prostate cancer    Hypertension    Pneumothorax 02/23/2018   left   Primary open angle glaucoma    prostate cancer 08/20/2018   Stage 4 very severe COPD by GOLD classification (HCC)    Tobacco abuse    Wears dentures    partial lower    Past Surgical History:  Procedure Laterality Date   CATARACT EXTRACTION W/PHACO Left 12/26/2022   Procedure: CATARACT EXTRACTION PHACO AND INTRAOCULAR LENS PLACEMENT (IOC) LEFT KAHOOK DUAL BLADE GONIOTOMY 3.48 00:29.3;  Surgeon: Mittie Gaskin, MD;  Location: [facility];  Service: Ophthalmology;  Laterality: Left;   CATARACT EXTRACTION W/PHACO Right 01/09/2023   Procedure: CATARACT EXTRACTION PHACO AND INTRAOCULAR LENS PLACEMENT (IOC) RIGH KAHOOK DUAL BLADE GONIOTOMY 5.39 00:26.8;  Surgeon: Mittie Gaskin, MD;  Location: [facility];  Service: Ophthalmology;  Laterality: Right;   COLON SURGERY     COLONOSCOPY WITH PROPOFOL  N/A 05/30/2015   Procedure: COLONOSCOPY WITH PROPOFOL ;  Surgeon: Stephen Copping, MD;  Location: [facility];  Service: Endoscopy;  Laterality: N/A;   COLONOSCOPY WITH PROPOFOL  N/A 08/28/2016   Procedure: COLONOSCOPY WITH PROPOFOL ;  Surgeon: Stephen Copping, MD;  Location: [facility];  Service: Endoscopy;  Laterality: N/A;   CYSTOSCOPY W/ RETROGRADES Bilateral 07/11/2015   Procedure: CYSTOSCOPY WITH RETROGRADE PYELOGRAM;  Surgeon: Rosina Riis, MD;   Location: [facility];  Service: Urology;  Laterality: Bilateral;   CYSTOSCOPY WITH STENT PLACEMENT Bilateral 07/11/2015   Procedure: CYSTOSCOPY WITH STENT PLACEMENT;  Surgeon: Rosina Riis, MD;  Location: [facility];  Service: Urology;  Laterality: Bilateral;   LAPAROSCOPIC PARTIAL COLECTOMY N/A 07/11/2015   Procedure: Laparoscopic right hemicolectomy;  Surgeon: Dorothyann LITTIE Husk, MD;  Location: [facility];  Service: General;  Laterality: N/A;   POLYPECTOMY  05/30/2015   Procedure: POLYPECTOMY;  Surgeon: Stephen Copping, MD;  Location[facility];  Service: Endoscopy;;   VIDEO ASSISTED THORACOSCOPY (VATS) W/TALC  PLEUADESIS Left 05/15/2018   Procedure: VIDEO ASSISTED THORACOSCOPY (VATS) W/TALC  PLEUADESIS;  Surgeon: Volney Lye, MD;  Location: [facility];  Service: Thoracic;  Laterality: Left;   VOLUME STUDY N/A 07/22/2018   Procedure: VOLUME STUDY;  Surgeon: Twylla Glendia BROCKS, MD;  Location: [facility];  Service: Urology;  Laterality: N/A;    Prior to Admission medications   Medication Sig Start Date End Date Taking? Authorizing Provider  atenolol  (TENORMIN ) 50 MG tablet Take 1 tablet (50 mg total) by mouth daily. 11/07/22   Dineen Rollene MATSU, FNP  atorvastatin  (LIPITOR) 10 MG tablet Take 1 tablet (10 mg total) by mouth daily. 04/29/23   Dineen Rollene MATSU, FNP  BREZTRI  AEROSPHERE 160-9-4.8 MCG/ACT AERO INHALE 2 PUFFS INTO THE LUNGS IN THE MORNING AND AT BEDTIME. 11/27/22   Tamea Dedra LITTIE, MD  brimonidine  (ALPHAGAN ) 0.2 % ophthalmic solution 1 drop 3 (three) times daily. 03/07/21   [provider]  Budeson-Glycopyrrol-Formoterol (BREZTRI  AEROSPHERE) 160-9-4.8 MCG/ACT AERO Inhale 2 puffs into the lungs  in the morning and at bedtime. 05/10/23   Tamea Dedra CROME, MD  chlorpheniramine (CHLOR-TRIMETON) 4 MG tablet Take 4 mg by mouth daily.    [provider]  clotrimazole -betamethasone  (LOTRISONE ) cream Apply 1 Application topically 2 (two) times daily as needed (for irritation).  04/10/23   Dineen Rollene MATSU, FNP  dorzolamide (TRUSOPT) 2 % ophthalmic solution 1 drop 2 (two) times daily.    [provider]  Glycerin (OPTASE DRY EYE INTENSE OP) Apply 1 drop to eye in the morning and at bedtime.    [provider]  latanoprost  (XALATAN ) 0.005 % ophthalmic solution Place 1 drop into both eyes at bedtime.    [provider]  NIFEdipine  (ADALAT  CC) 30 MG 24 hr tablet Take 1 tablet (30 mg total) by mouth every morning. 11/07/22   Dineen Rollene MATSU, FNP  OXYGEN  Inhale 2 L into the lungs at bedtime.    [provider]  timolol  (BETIMOL ) 0.5 % ophthalmic solution Place 1 drop into both eyes 2 (two) times daily.    [provider]    Allergies as of 04/12/2023   (No Known Allergies)    Family History  Problem Relation Age of Onset   Hypertension Mother    Heart disease Mother    Heart attack Mother 28   Diabetes Father    Hypertension Father    Stroke Father 56   Cancer Sister 82       unsure of type   Hypertension Brother    Gout Brother    Kidney disease Neg Hx    Prostate cancer Neg Hx    Bladder Cancer Neg Hx     Social History   Socioeconomic History   Marital status: Married    Spouse name: Rosa   Number of children: 1   Years of education: Not on file   Highest education level: Not on file  Occupational History   Occupation: [employer]    Comment: retired  Tobacco Use   Smoking status: Former    Current packs/day: 0.00    Average packs/day: 1 pack/day for 30.0 years (30.0 ttl pk-yrs)    Types: Cigarettes    Start date: 11/20/1986    Quit date: 11/19/2016    Years since quitting: 6.5   Smokeless tobacco: Never  Vaping Use   Vaping status: Never Used  Substance and Sexual Activity   Alcohol use: Not Currently    Comment: very rare use , 03/2023; last use 12/22/22, previous 1-2 shots 2 times a week   Drug use: No   Sexual activity: Yes  Other Topics Concern   Not on file  Social History  Narrative   Born in [place]   From [place]   Lives with wife , step daughter   Gun in home      He has son in [place]; he has 2 granddaughters         1 brother in [place]   1 brother in [place]   1 sister in [place]   1 brother [place]      1 sister passed      Social Drivers of Corporate Investment Banker Strain: Low Risk  (01/30/2023)   Overall Financial Resource Strain (CARDIA)    Difficulty of Paying Living Expenses: Not hard at all  Food Insecurity: No Food Insecurity (01/30/2023)   Hunger Vital Sign    Worried About Running Out of Food in the Last Year: Never true    Ran  Out of Food in the Last Year: Never true  Transportation Needs: No Transportation Needs (01/30/2023)   PRAPARE - Administrator, Civil Service (Medical): No    Lack of Transportation (Non-Medical): No  Physical Activity: Inactive (01/30/2023)   Exercise Vital Sign    Days of Exercise per Week: 0 days    Minutes of Exercise per Session: 0 min  Stress: No Stress Concern Present (01/30/2023)   [employer] of Occupational Health - Occupational Stress Questionnaire    Feeling of Stress : Not at all  Social Connections: Moderately Integrated (01/30/2023)   Social Connection and Isolation Panel [NHANES]    Frequency of Communication with Friends and Family: More than three times a week    Frequency of Social Gatherings with Friends and Family: Once a week    Attends Religious Services: More than 4 times per year    Active Member of [employer] or Organizations: No    Attends Banker Meetings: Never    Marital Status: Married  Catering Manager Violence: Not At Risk (01/30/2023)   Humiliation, Afraid, Rape, and Kick questionnaire    Fear of Current or Ex-Partner: No    Emotionally Abused: No    Physically Abused: No    Sexually Abused: No    Review of Systems: See HPI, otherwise negative ROS  Physical Exam: There were no vitals taken for this visit. General:   Alert,   pleasant and cooperative in NAD Head:  Normocephalic and atraumatic. Neck:  Supple; no masses or thyromegaly. Lungs:  Clear throughout to auscultation.    Heart:  Regular rate and rhythm. Abdomen:  Soft, nontender and nondistended. Normal bowel sounds, without guarding, and without rebound.   Neurologic:  Alert and  oriented x4;  grossly normal neurologically.  Impression/Plan: Norleen DELENA Seeds is here for an colonoscopy to be performed for a history of adenomatous polyps on 2018   Risks, benefits, limitations, and alternatives regarding  colonoscopy have been reviewed with the patient.  Questions have been answered.  All parties agreeable.   Stephen Copping, MD  05/28/2023, 10:05 AM

## 2023-05-28 NOTE — Op Note (Signed)
 [facility] Gastroenterology Patient Name: Stephen Madden Procedure Date: 05/28/2023 10:35 AM MRN: [id number] Account #: [id number] Date of Birth: 16-Mar-1951 Admit Type: Outpatient Age: 73 Room: [facility] ENDO ROOM 4 Gender: Male Note Status: Finalized Instrument Name: Arvis [id number] Procedure:             Colonoscopy Indications:           High risk colon cancer surveillance: Personal history                         of colonic polyps Providers:             [name] MD, MD Referring MD:          Rollene MATSU. Arnett (Referring MD) Medicines:             Propofol  per Anesthesia Complications:         No immediate complications. Procedure:             Pre-Anesthesia Assessment:                        - Prior to the procedure, a History and Physical was                         performed, and patient medications and allergies were                         reviewed. The patient's tolerance of previous                         anesthesia was also reviewed. The risks and benefits                         of the procedure and the sedation options and risks                         were discussed with the patient. All questions were                         answered, and informed consent was obtained. Prior                         Anticoagulants: The patient has taken no anticoagulant                         or antiplatelet agents. ASA Grade Assessment: II - A                         patient with mild systemic disease. After reviewing                         the risks and benefits, the patient was deemed in                         satisfactory condition to undergo the procedure.                        After obtaining informed consent, the colonoscope was  passed under direct vision. Throughout the procedure,                         the patient's blood pressure, pulse, and oxygen                          saturations were monitored continuously. The                          Colonoscope was introduced through the anus and                         advanced to the the ileocolonic anastomosis. The                         colonoscopy was performed without difficulty. The                         patient tolerated the procedure well. The quality of                         the bowel preparation was excellent. Findings:      The perianal and digital rectal examinations were normal.      A 3 mm polyp was found in the sigmoid colon. The polyp was sessile. The       polyp was removed with a cold snare. Resection and retrieval were       complete.      Two sessile polyps were found in the transverse colon. The polyps were 3       to 4 mm in size. These polyps were removed with a cold snare. Resection       and retrieval were complete.      There was evidence of a prior end-to-side ileo-colonic anastomosis in       the transverse colon. This was patent.      Biopsies were taken with a cold forceps in the proximal ileum for       histology.      Non-bleeding internal hemorrhoids were found during retroflexion. The       hemorrhoids were Grade II (internal hemorrhoids that prolapse but reduce       spontaneously). Impression:            - One 3 mm polyp in the sigmoid colon, removed with a                         cold snare. Resected and retrieved.                        - Two 3 to 4 mm polyps in the transverse colon,                         removed with a cold snare. Resected and retrieved.                        - Patent end-to-side ileo-colonic anastomosis.                        - Non-bleeding internal hemorrhoids.                        -  Biopsies were taken with a cold forceps for                         histology in the proximal ileum. Recommendation:        - Discharge patient to home.                        - Resume previous diet.                        - Continue present medications.                        - Await pathology results.                         - Repeat colonoscopy in 5 years for surveillance. Procedure Code(s):     --- Professional ---                        [phone], Colonoscopy, flexible; with removal of                         tumor(s), polyp(s), or other lesion(s) by snare                         technique                        [id number], 59, Colonoscopy, flexible; with biopsy, single                         or multiple Diagnosis Code(s):     --- Professional ---                        Z86.010, Personal history of colonic polyps                        D12.5, Benign neoplasm of sigmoid colon CPT copyright 2022 American Medical Association. All rights reserved. The codes documented in this report are preliminary and upon coder review may  be revised to meet current compliance requirements. [name] MD, MD 05/28/2023 10:59:11 AM This report has been signed electronically. Number of Addenda: 0 Note Initiated On: 05/28/2023 10:35 AM Scope Withdrawal Time: 0 hours 7 minutes 44 seconds  Total Procedure Duration: 0 hours 10 minutes 32 seconds  Estimated Blood Loss:  Estimated blood loss: none.      [facility]

## 2023-05-29 ENCOUNTER — Encounter: Payer: Self-pay | Admitting: Gastroenterology

## 2023-05-31 LAB — SURGICAL PATHOLOGY

## 2023-06-05 ENCOUNTER — Telehealth: Payer: Self-pay | Admitting: Family

## 2023-06-05 DIAGNOSIS — K7689 Other specified diseases of liver: Secondary | ICD-10-CM

## 2023-06-05 NOTE — Telephone Encounter (Signed)
 Copied from CRM [id number]. Topic: General - Other >> Jun 05, 2023 11:56 AM Kita Perish H wrote: Reason for CRM: Patient states he's suppose to have lab work tomorrow, states he discussed a procedure maybe ultrasound with provider and decided he wants to do it. Patient states he needs an order for the [facility] and wants to schedule for Feb 13 if possible, he has a ct scan for that day and wants to schedule both for the same day.  Autry Legions [phone]

## 2023-06-05 NOTE — Addendum Note (Signed)
 Addended by: Calista Catching on: 06/05/2023 10:22 PM   Modules accepted: Orders

## 2023-06-05 NOTE — Telephone Encounter (Signed)
 Stephen Madden  Can we accommodate pt and sch ultrasound on 07/04/23?   Please let me know and thank you.

## 2023-06-06 ENCOUNTER — Other Ambulatory Visit: Payer: PPO

## 2023-06-06 DIAGNOSIS — I700 Atherosclerosis of aorta: Secondary | ICD-10-CM | POA: Diagnosis not present

## 2023-06-06 LAB — LIPID PANEL
Cholesterol: 159 mg/dL (ref 0–200)
HDL: 63.9 mg/dL (ref >39.00)
LDL Cholesterol: 80 mg/dL (ref 0–99)
NonHDL: 95.59
Total CHOL/HDL Ratio: 2
Triglycerides: 76 mg/dL (ref 0.0–149.0)
VLDL: 15.2 mg/dL (ref 0.0–40.0)

## 2023-06-06 LAB — HEPATIC FUNCTION PANEL
ALT: 12 U/L (ref 0–53)
AST: 16 U/L (ref 0–37)
Albumin: 4.4 g/dL (ref 3.5–5.2)
Alkaline Phosphatase: 76 U/L (ref 39–117)
Bilirubin, Direct: 0.1 mg/dL (ref 0.0–0.3)
Total Bilirubin: 0.5 mg/dL (ref 0.2–1.2)
Total Protein: 7.2 g/dL (ref 6.0–8.3)

## 2023-06-10 NOTE — Telephone Encounter (Signed)
Noted  [place] is scheduled 07/04/23

## 2023-06-11 ENCOUNTER — Other Ambulatory Visit: Payer: PPO

## 2023-06-16 ENCOUNTER — Encounter: Payer: Self-pay | Admitting: Gastroenterology

## 2023-06-18 ENCOUNTER — Ambulatory Visit: Payer: Self-pay | Admitting: Family

## 2023-06-18 NOTE — Telephone Encounter (Signed)
Copied from CRM [id number]. Topic: Clinical - Red Word Triage >> Jun 18, 2023  9:58 AM Pascal Lux wrote: Red Word that prompted transfer to Nurse Triage: Running nose, coughing up phlegm and light fever  Chief Complaint: cough Symptoms:   Runny nose, cough/productive clear,  fever-98.5, sweating, wheezing, sore throat, chest congestion Frequency: Saturday Pertinent Negatives: Patient denies SOB Disposition: [] ED /[] Urgent Care (no appt availability in office) / [] Appointment(In office/virtual)/ []  [place] [facility]/ [x] Home Care/ [] Refused Recommended Disposition /[] [place] [facility]/ []  Follow-up with PCP Additional Notes: pt stated he did not want an appt he only wanted a recommendation of things he could take at home to help with cold s/s.  Pt would like a call back regarding decongestants he can take OTC that will not affect his BP  Reason for Disposition  Common cold with no complications  Answer Assessment - Initial Assessment Questions 1. ONSET: "When did the nasal discharge start?"      Saturday 2. AMOUNT: "How much discharge is there?"      Large amount 3. COUGH: "Do you have a cough?" If Yes, ask: "Describe the color of your sputum" (clear, white, yellow, green)     Clear 4. RESPIRATORY DISTRESS: "Describe your breathing."      no 5. FEVER: "Do you have a fever?" If Yes, ask: "What is your temperature, how was it measured, and when did it start?"     Pt states fever 6. SEVERITY: "Overall, how bad are you feeling right now?" (e.g., doesn't interfere with normal activities, staying home from school/work, staying in bed)      Pt feels weak 7. OTHER SYMPTOMS: "Do you have any other symptoms?" (e.g., sore throat, earache, wheezing, vomiting)     Runny nose, coughing up phlegm - clear,  fever-98.5, sweating, wheezing, sore throat 8. PREGNANCY: "Is there any chance you are pregnant?" "When was your last menstrual period?"     N/a  Protocols used: Common Cold-A-AH

## 2023-06-18 NOTE — Telephone Encounter (Signed)
Spoke to pt and he went to pharmacy and they gave him a nasal spray and he stated that if he was not feeling any better by Thursday he would call back and schedule an ov

## 2023-06-20 ENCOUNTER — Encounter: Payer: Self-pay | Admitting: Nurse Practitioner

## 2023-06-20 ENCOUNTER — Ambulatory Visit: Payer: PPO | Admitting: Nurse Practitioner

## 2023-06-20 ENCOUNTER — Ambulatory Visit: Payer: Self-pay | Admitting: Family

## 2023-06-20 VITALS — BP 122/72 | HR 91 | Temp 98.1°F | Ht 76.0 in | Wt 198.4 lb

## 2023-06-20 DIAGNOSIS — R52 Pain, unspecified: Secondary | ICD-10-CM

## 2023-06-20 DIAGNOSIS — R059 Cough, unspecified: Secondary | ICD-10-CM

## 2023-06-20 LAB — POCT INFLUENZA A/B
Influenza A, POC: NEGATIVE
Influenza B, POC: NEGATIVE

## 2023-06-20 MED ORDER — AMOXICILLIN-POT CLAVULANATE 875-125 MG PO TABS: 1.0000 | ORAL_TABLET | Freq: Two times a day (BID) | ORAL | 0 refills | Status: DC

## 2023-06-20 MED ORDER — HYDROCODONE BIT-HOMATROP MBR 5-1.5 MG/5ML PO SOLN: 5.0000 mL | Freq: Every evening | ORAL | 0 refills | Status: DC | PRN

## 2023-06-20 NOTE — Progress Notes (Signed)
Established Patient Office Visit  Subjective:  Patient ID: Stephen Madden, male    DOB: Jun 11, 1950  Age: 73 y.o. MRN: [id number]  CC:  Chief Complaint  Patient presents with   Cough    HPI  GARFIELD COINER presents for:  Cough This is a new problem. The current episode started in the past 7 days. The problem has been unchanged. The problem occurs hourly. The cough is Productive of sputum. Associated symptoms include nasal congestion, postnasal drip and a sore throat. Pertinent negatives include no chills, ear congestion, ear pain, fever, shortness of breath or wheezing. Nothing aggravates the symptoms. He has tried OTC cough suppressant for the symptoms. The treatment provided mild relief. His past medical history is significant for COPD.     Past Medical History:  Diagnosis Date   BPH with obstruction/lower urinary tract symptoms    Cancer [facility])    prostate   Chronic respiratory failure with hypoxia, on home O2 therapy (HCC)    COPD (chronic obstructive pulmonary disease) (HCC)    History of prostate cancer    Hypertension    Pneumothorax 02/23/2018   left   Primary open angle glaucoma    prostate cancer 08/20/2018   Stage 4 very severe COPD by GOLD classification (HCC)    Tobacco abuse    Wears dentures    partial lower    Past Surgical History:  Procedure Laterality Date   BIOPSY  05/28/2023   Procedure: BIOPSY;  Surgeon: Midge Minium, MD;  Location: [facility] ENDOSCOPY;  Service: Endoscopy;;   CATARACT EXTRACTION W/PHACO Left 12/26/2022   Procedure: CATARACT EXTRACTION PHACO AND INTRAOCULAR LENS PLACEMENT (IOC) LEFT KAHOOK DUAL BLADE GONIOTOMY 3.48 00:29.3;  Surgeon: Lockie Mola, MD;  Location: [facility]ERY [facility]TR;  Service: Ophthalmology;  Laterality: Left;   CATARACT EXTRACTION W/PHACO Right 01/09/2023   Procedure: CATARACT EXTRACTION PHACO AND INTRAOCULAR LENS PLACEMENT (IOC) RIGH KAHOOK DUAL BLADE GONIOTOMY 5.39 00:26.8;  Surgeon: Lockie Mola, MD;  Location:  [facility] SURGERY [facility];  Service: Ophthalmology;  Laterality: Right;   COLON SURGERY     COLONOSCOPY WITH PROPOFOL N/A 05/30/2015   Procedure: COLONOSCOPY WITH PROPOFOL;  Surgeon: Midge Minium, MD;  Location: [facility] SURGERY [facility];  Service: Endoscopy;  Laterality: N/A;   COLONOSCOPY WITH PROPOFOL N/A 08/28/2016   Procedure: COLONOSCOPY WITH PROPOFOL;  Surgeon: Midge Minium, MD;  Location: [facility] ENDOSCOPY;  Service: Endoscopy;  Laterality: N/A;   COLONOSCOPY WITH PROPOFOL N/A 05/28/2023   Procedure: COLONOSCOPY WITH PROPOFOL;  Surgeon: Midge Minium, MD;  Location: [facility] ENDOSCOPY;  Service: Endoscopy;  Laterality: N/A;   CYSTOSCOPY W/ RETROGRADES Bilateral 07/11/2015   Procedure: CYSTOSCOPY WITH RETROGRADE PYELOGRAM;  Surgeon: Vanna Scotland, MD;  Location: [facility] ORS;  Service: Urology;  Laterality: Bilateral;   CYSTOSCOPY WITH STENT PLACEMENT Bilateral 07/11/2015   Procedure: CYSTOSCOPY WITH STENT PLACEMENT;  Surgeon: Vanna Scotland, MD;  Location: [facility] ORS;  Service: Urology;  Laterality: Bilateral;   LAPAROSCOPIC PARTIAL COLECTOMY N/A 07/11/2015   Procedure: Laparoscopic right hemicolectomy;  Surgeon: Gladis Riffle, MD;  Location: [facility] ORS;  Service: General;  Laterality: N/A;   POLYPECTOMY  05/30/2015   Procedure: POLYPECTOMY;  Surgeon: Midge Minium, MD;  Location: [facility] SUR[facility]ERY [facility];  Service: Endoscopy;;   POLYPECTOMY  05/28/2023   Procedure: POLYPECTOMY;  Surgeon: Midge Minium, MD;  Location: [facility] ENDOSCOPY;  Service: Endoscopy;;   VIDEO ASSISTED THORACOSCOPY (VATS) W/TALC PLEUADESIS Left 05/15/2018   Procedure: VIDEO ASSISTED THORACOSCOPY (VATS) Thea Alken PLEUADESIS;  Surgeon: Hulda Marin, MD;  Location: [facility] ORS;  Service: Thoracic;  Laterality: Left;   VOLUME STUDY N/A 07/22/2018   Procedure: VOLUME STUDY;  Surgeon: Riki Altes, MD;  Location: [facility] ORS;  Service: Urology;  Laterality: N/A;    Family History  Problem Relation Age of Onset   Hypertension Mother    Heart disease Mother    Heart  attack Mother 29   Diabetes Father    Hypertension Father    Stroke Father 25   Cancer Sister 7       unsure of type   Hypertension Brother    Gout Brother    Kidney disease Neg Hx    Prostate cancer Neg Hx    Bladder Cancer Neg Hx     Social History   Socioeconomic History   Marital status: Married    Spouse name: Rosa   Number of children: 1   Years of education: Not on file   Highest education level: Not on file  Occupational History   Occupation: [employer]    Comment: retired  Tobacco Use   Smoking status: Former    Current packs/day: 0.00    Average packs/day: 1 pack/day for 30.0 years (30.0 ttl pk-yrs)    Types: Cigarettes    Start date: 11/20/1986    Quit date: 11/19/2016    Years since quitting: 6.5   Smokeless tobacco: Never  Vaping Use   Vaping status: Never Used  Substance and Sexual Activity   Alcohol use: Not Currently    Comment: very rare use , 03/2023; last use 12/22/22, previous 1-2 shots 2 times a week   Drug use: No   Sexual activity: Yes  Other Topics Concern   Not on file  Social History Narrative   Born in [place]   From [place]   Lives with wife , step daughter   Gun in home      He has son in [place]; he has 2 granddaughters         1 brother in [place]   1 brother in [place]   1 sister in [place]   1 brother [place]      1 sister passed      Social Drivers of Corporate investment banker Strain: Low Risk  (01/30/2023)   Overall Financial Resource Strain (CARDIA)    Difficulty of Paying Living Expenses: Not hard at all  Food Insecurity: No Food Insecurity (01/30/2023)   Hunger Vital Sign    Worried About Running Out of Food in the Last Year: Never true    Ran Out of Food in the Last Year: Never true  Transportation Needs: No Transportation Needs (01/30/2023)   PRAPARE - Administrator, Civil Service (Medical): No    Lack of Transportation (Non-Medical): No  Physical Activity: Inactive (01/30/2023)    Exercise Vital Sign    Days of Exercise per Week: 0 days    Minutes of Exercise per Session: 0 min  Stress: No Stress Concern Present (01/30/2023)   [employer] of Occupational Health - Occupational Stress Questionnaire    Feeling of Stress : Not at all  Social Connections: Moderately Integrated (01/30/2023)   Social Connection and Isolation Panel [NHANES]    Frequency of Communication with Friends and Family: More than three times a week    Frequency of Social Gatherings with Friends and Family: Once a week    Attends Religious Services: More than 4 times per year    Active Member of Clubs or Organizations: No  Attends Banker Meetings: Never    Marital Status: Married  Catering manager Violence: Not At Risk (01/30/2023)   Humiliation, Afraid, Rape, and Kick questionnaire    Fear of Current or Ex-Partner: No    Emotionally Abused: No    Physically Abused: No    Sexually Abused: No     Outpatient Medications Prior to Visit  Medication Sig Dispense Refill   atenolol (TENORMIN) 50 MG tablet Take 1 tablet (50 mg total) by mouth daily. 90 tablet 3   atorvastatin (LIPITOR) 10 MG tablet Take 1 tablet (10 mg total) by mouth daily. 90 tablet 3   BREZTRI AEROSPHERE 160-9-4.8 MCG/ACT AERO INHALE 2 PUFFS INTO THE LUNGS IN THE MORNING AND AT BEDTIME. 10.7 each 11   brimonidine (ALPHAGAN) 0.2 % ophthalmic solution 1 drop 3 (three) times daily.     Budeson-Glycopyrrol-Formoterol (BREZTRI AEROSPHERE) 160-9-4.8 MCG/ACT AERO Inhale 2 puffs into the lungs in the morning and at bedtime. 11.8 g 0   chlorpheniramine (CHLOR-TRIMETON) 4 MG tablet Take 4 mg by mouth daily.     clotrimazole-betamethasone (LOTRISONE) cream Apply 1 Application topically 2 (two) times daily as needed (for irritation). 30 g 6   dorzolamide (TRUSOPT) 2 % ophthalmic solution 1 drop 2 (two) times daily.     Glycerin (OPTASE DRY EYE INTENSE OP) Apply 1 drop to eye in the morning and at bedtime.     latanoprost  (XALATAN) 0.005 % ophthalmic solution Place 1 drop into both eyes at bedtime.     NIFEdipine (ADALAT CC) 30 MG 24 hr tablet Take 1 tablet (30 mg total) by mouth every morning. 90 tablet 3   OXYGEN Inhale 2 L into the lungs at bedtime.     timolol (BETIMOL) 0.5 % ophthalmic solution Place 1 drop into both eyes 2 (two) times daily.     No facility-administered medications prior to visit.    No Known Allergies  ROS Review of Systems  Constitutional:  Negative for chills and fever.  HENT:  Positive for postnasal drip and sore throat. Negative for ear pain.   Respiratory:  Positive for cough. Negative for shortness of breath and wheezing.    Negative unless indicated in HPI.    Objective:    Physical Exam Constitutional:      Appearance: Normal appearance.  HENT:     Right Ear: Tympanic membrane normal. Tympanic membrane is not erythematous.     Left Ear: Tympanic membrane normal. Tympanic membrane is not erythematous.     Nose:     Right Turbinates: Not enlarged.     Left Turbinates: Not enlarged.     Right Sinus: No maxillary sinus tenderness or frontal sinus tenderness.     Left Sinus: No maxillary sinus tenderness or frontal sinus tenderness.     Mouth/Throat:     Mouth: Mucous membranes are moist.     Pharynx: No pharyngeal swelling, oropharyngeal exudate or posterior oropharyngeal erythema.     Tonsils: No tonsillar exudate.  Cardiovascular:     Rate and Rhythm: Normal rate and regular rhythm.  Pulmonary:     Effort: Pulmonary effort is normal.     Breath sounds: Normal breath sounds. No stridor. No wheezing.  Neurological:     General: No focal deficit present.     Mental Status: He is alert and oriented to person, place, and time. Mental status is at baseline.  Psychiatric:        Mood and Affect: Mood normal.  Behavior: Behavior normal.        Thought Content: Thought content normal.        Judgment: Judgment normal.     BP 122/72   Pulse 91   Temp 98.1  F (36.7 C)   Ht 6\' 4"  (1.93 m)   Wt 198 lb 6.4 oz (90 kg)   SpO2 92%   BMI 24.15 kg/m  Wt Readings from Last 3 Encounters:  06/20/23 198 lb 6.4 oz (90 kg)  05/28/23 197 lb (89.4 kg)  05/10/23 213 lb (96.6 kg)     Health Maintenance  Topic Date Due   DTaP/Tdap/Td (1 - Tdap) Never done   Zoster Vaccines- Shingrix (1 of 2) Never done   COVID-19 Vaccine (4 - 2024-25 season) 06/06/2023   Lung Cancer Screening  07/03/2023   Medicare Annual Wellness (AWV)  01/30/2024   Colonoscopy  05/27/2028   Pneumonia Vaccine 40+ Years old  Completed   INFLUENZA VACCINE  Completed   Hepatitis C Screening  Completed   HPV VACCINES  Aged Out    There are no preventive care reminders to display for this patient.  Lab Results  Component Value Date   TSH 0.79 04/10/2023   Lab Results  Component Value Date   WBC 5.3 04/10/2023   HGB 13.1 04/10/2023   HCT 40.7 04/10/2023   MCV 95.5 04/10/2023   PLT 203.0 04/10/2023   Lab Results  Component Value Date   NA 141 04/10/2023   K 4.0 04/10/2023   CO2 31 04/10/2023   GLUCOSE 104 (H) 04/10/2023   BUN 19 04/10/2023   CREATININE 1.22 04/10/2023   BILITOT 0.5 06/06/2023   ALKPHOS 76 06/06/2023   AST 16 06/06/2023   ALT 12 06/06/2023   PROT 7.2 06/06/2023   ALBUMIN 4.4 06/06/2023   CALCIUM 9.5 04/10/2023   ANIONGAP 8 05/19/2018   GFR 58.96 (L) 04/10/2023   Lab Results  Component Value Date   CHOL 159 06/06/2023   Lab Results  Component Value Date   HDL 63.90 06/06/2023   Lab Results  Component Value Date   LDLCALC 80 06/06/2023   Lab Results  Component Value Date   TRIG 76.0 06/06/2023   Lab Results  Component Value Date   CHOLHDL 2 06/06/2023   Lab Results  Component Value Date   HGBA1C 6.4 04/10/2023      Assessment & Plan:  Cough, unspecified type Assessment & Plan: Pt is afebrile, non toxic appearing, without respiratoty distress. POCT flu is negative.  Will treat with Augmentin and hycodan at bed time. Advised  to continue take plain Mucinex and take OTC antihistamine for PND. Incresae fluid intake and rest.   Orders: -     POCT Influenza A/B -     Amoxicillin-Pot Clavulanate; Take 1 tablet by mouth 2 (two) times daily.  Dispense: 20 tablet; Refill: 0 -     HYDROcodone Bit-Homatrop MBr; Take 5 mLs by mouth at bedtime as needed for cough.  Dispense: 30 mL; Refill: 0  Body aches -     POCT Influenza A/B    Follow-up: Return if symptoms worsen or fail to improve.   Kara Dies, NP

## 2023-06-20 NOTE — Telephone Encounter (Signed)
Appt scheduled for today.

## 2023-06-20 NOTE — Assessment & Plan Note (Signed)
Pt is afebrile, non toxic appearing, without respiratoty distress. POCT flu is negative.  Will treat with Augmentin and hycodan at bed time. Advised to continue take plain Mucinex and take OTC antihistamine for PND. Incresae fluid intake and rest.

## 2023-06-20 NOTE — Patient Instructions (Signed)
Flu test is negative. Will start you on antibiotic twice a day for 10 days and cough medication. Increase fluid intake and take plain mucinex  for congestion. Increase fluid intake. Please let us know if the symptoms not improving.

## 2023-06-20 NOTE — Telephone Encounter (Signed)
   Chief Complaint: cough Symptoms: severe cough, runny nose Frequency: 3 days Pertinent Negatives: Patient denies fever, sob Disposition: [] ED /[] Urgent Care (no appt availability in office) / [x] Appointment(In office/virtual)/ []  [place] [facility]/ [] Home Care/ [] Refused Recommended Disposition /[] [place] [facility]/ []  Follow-up with PCP Additional Notes: Patient reports he spoke to someone "Madden few days ago" at the office who advised him to call back if his cold symptoms were worsening. Patient is now reporting severe cough and runny nose. Patient reports his OTC medications have not been helping. Per protocol, appt scheduled today 1/20. Patient advised to call back with worsening symptoms. Patient verbalized understanding.     Copied from CRM [id number]. Topic: Clinical - Medical Advice >> Jun 20, 2023  9:51 AM Stephen Madden wrote: Reason for CRM: Patient was told to call back if his cold symptoms aren't better, patient bought over the counter saline & Mucinex. Reason for Disposition  [1] Continuous (nonstop) coughing interferes with work or school AND [2] no improvement using cough treatment per Care Advice  Answer Assessment - Initial Assessment Questions 1. ONSET: "When did the cough begin?"      3 days 2. SEVERITY: "How bad is the cough today?"      moderate 3. SPUTUM: "Describe the color of your sputum" (none, dry cough; clear, white, yellow, green)     Clear and yellow 4. HEMOPTYSIS: "Are you coughing up any blood?" If so ask: "How much?" (flecks, streaks, tablespoons, etc.)     none 5. DIFFICULTY BREATHING: "Are you having difficulty breathing?" If Yes, ask: "How bad is it?" (e.g., mild, moderate, severe)    - MILD: No SOB at rest, mild SOB with walking, speaks normally in sentences, can lie down, no retractions, pulse < 100.    - MODERATE: SOB at rest, SOB with minimal exertion and prefers to sit, cannot lie down flat, speaks in phrases, mild retractions, audible wheezing,  pulse 100-120.    - SEVERE: Very SOB at rest, speaks in single words, struggling to breathe, sitting hunched forward, retractions, pulse > 120      none 6. FEVER: "Do you have Madden fever?" If Yes, ask: "What is your temperature, how was it measured, and when did it start?"     none 7. CARDIAC HISTORY: "Do you have any history of heart disease?" (e.g., heart attack, congestive heart failure)      none 8. LUNG HISTORY: "Do you have any history of lung disease?"  (e.g., pulmonary embolus, asthma, emphysema)     none 9. PE RISK FACTORS: "Do you have Madden history of blood clots?" (or: recent major surgery, recent prolonged travel, bedridden)     none 10. OTHER SYMPTOMS: "Do you have any other symptoms?" (e.g., runny nose, wheezing, chest pain)       Runny nose, cough  Protocols used: Cough - Acute Productive-Madden-AH

## 2023-07-04 ENCOUNTER — Ambulatory Visit
Admission: RE | Admit: 2023-07-04 | Discharge: 2023-07-04 | Disposition: A | Discharge status: Home / Self Care | Payer: PPO | Source: Ambulatory Visit | Attending: Acute Care | Admitting: Acute Care

## 2023-07-04 ENCOUNTER — Ambulatory Visit
Admission: RE | Admit: 2023-07-04 | Discharge: 2023-07-04 | Disposition: A | Discharge status: Home / Self Care | Payer: PPO | Source: Ambulatory Visit | Attending: Family

## 2023-07-04 DIAGNOSIS — K7689 Other specified diseases of liver: Secondary | ICD-10-CM

## 2023-07-04 DIAGNOSIS — Z122 Encounter for screening for malignant neoplasm of respiratory organs: Secondary | ICD-10-CM | POA: Insufficient documentation

## 2023-07-04 DIAGNOSIS — Z87891 Personal history of nicotine dependence: Secondary | ICD-10-CM | POA: Diagnosis not present

## 2023-07-09 ENCOUNTER — Encounter: Payer: Self-pay | Admitting: Family

## 2023-07-09 DIAGNOSIS — K760 Fatty (change of) liver, not elsewhere classified: Secondary | ICD-10-CM | POA: Insufficient documentation

## 2023-07-09 DIAGNOSIS — Z961 Presence of intraocular lens: Secondary | ICD-10-CM | POA: Diagnosis not present

## 2023-07-09 DIAGNOSIS — H401133 Primary open-angle glaucoma, bilateral, severe stage: Secondary | ICD-10-CM | POA: Diagnosis not present

## 2023-07-11 ENCOUNTER — Ambulatory Visit: Payer: PPO | Admitting: Family

## 2023-07-15 NOTE — Progress Notes (Signed)
 Assessment & Plan:  Hepatic steatosis Assessment & Plan: Discussed hepatic steatosis, simple hepatic cyst less than 4 cm.  No further surveillance of hepatic cyst required as long as patient is asymptomatic without abdominal pain. Liver function tests in blood work have been normal. Advised lifestyle management is of upmost importance for hepatic steatosis.   Secondary hypertension  Rash -     Clotrimazole-Betamethasone; Apply 1 Application topically 2 (two) times daily as needed (for irritation).  Dispense: 30 g; Refill: 2  Chronic obstructive pulmonary disease, unspecified COPD type (HCC) Assessment & Plan: Reviewed CT chest, B RADS 2.  COPD changes.  Advise repeat CT in 1 year      Return precautions given.   Risks, benefits, and alternatives of the medications and treatment plan prescribed today were discussed, and patient expressed understanding.   Education regarding symptom management and diagnosis given to patient on AVS either electronically or printed.  Return in about 6 months (around 01/15/2024).  Rennie Plowman, FNP  Subjective:    Patient ID: Stephen Madden, male    DOB: [birth year], 73 y.o.   MRN: [id number]  CC: Stephen Madden is a 73 y.o. male who presents today for follow up.   HPI: Feels well today.  No new complaints.  Here to follow-up on recent  US RUQ, CT chest   He remains compliant with atenolol 50 mg daily.  Denies abdominal pain, chest pain, shortness of breath   Occasional alcohol use.   06/2023 Right upper quadrant ultrasound evidence of steatosis, multiple hepatic cyst identified as simple, largest 3.7.  Allergies: Patient has no known allergies. Current Outpatient Medications on File Prior to Visit  Medication Sig Dispense Refill   atenolol (TENORMIN) 50 MG tablet Take 1 tablet (50 mg total) by mouth daily. 90 tablet 3   atorvastatin (LIPITOR) 10 MG tablet Take 1 tablet (10 mg total) by mouth daily. 90 tablet 3   brimonidine (ALPHAGAN) 0.2  % ophthalmic solution 1 drop 3 (three) times daily.     Budeson-Glycopyrrol-Formoterol (BREZTRI AEROSPHERE) 160-9-4.8 MCG/ACT AERO Inhale 2 puffs into the lungs in the morning and at bedtime. 11.8 g 0   chlorpheniramine (CHLOR-TRIMETON) 4 MG tablet Take 4 mg by mouth daily.     dorzolamide (TRUSOPT) 2 % ophthalmic solution 1 drop 2 (two) times daily.     Glycerin (OPTASE DRY EYE INTENSE OP) Apply 1 drop to eye in the morning and at bedtime.     latanoprost (XALATAN) 0.005 % ophthalmic solution Place 1 drop into both eyes at bedtime.     NIFEdipine (ADALAT CC) 30 MG 24 hr tablet Take 1 tablet (30 mg total) by mouth every morning. 90 tablet 3   OXYGEN Inhale 2 L into the lungs at bedtime.     timolol (BETIMOL) 0.5 % ophthalmic solution Place 1 drop into both eyes 2 (two) times daily.     BREZTRI AEROSPHERE 160-9-4.8 MCG/ACT AERO INHALE 2 PUFFS INTO THE LUNGS IN THE MORNING AND AT BEDTIME. 10.7 each 11   No current facility-administered medications on file prior to visit.    Review of Systems  Constitutional:  Negative for chills and fever.  Respiratory:  Negative for cough.   Cardiovascular:  Negative for chest pain and palpitations.  Gastrointestinal:  Negative for nausea and vomiting.      Objective:    BP 112/70   Pulse 78   Temp 98.2 F (36.8 C) (Oral)   Ht 6\' 4"  (1.93 m)   Wt  204 lb 9.6 oz (92.8 kg)   SpO2 94%   BMI 24.90 kg/m  BP Readings from Last 3 Encounters:  07/18/23 112/70  06/20/23 122/72  05/28/23 124/77   Wt Readings from Last 3 Encounters:  07/18/23 204 lb 9.6 oz (92.8 kg)  06/20/23 198 lb 6.4 oz (90 kg)  05/28/23 197 lb (89.4 kg)    Physical Exam Vitals reviewed.  Constitutional:      Appearance: He is well-developed.  Cardiovascular:     Rate and Rhythm: Regular rhythm.     Heart sounds: Normal heart sounds.  Pulmonary:     Effort: Pulmonary effort is normal. No respiratory distress.     Breath sounds: Normal breath sounds. No wheezing, rhonchi or  rales.  Skin:    General: Skin is warm and dry.  Neurological:     Mental Status: He is alert.  Psychiatric:        Speech: Speech normal.        Behavior: Behavior normal.

## 2023-07-16 ENCOUNTER — Other Ambulatory Visit: Payer: Self-pay

## 2023-07-16 DIAGNOSIS — Z87891 Personal history of nicotine dependence: Secondary | ICD-10-CM

## 2023-07-16 DIAGNOSIS — Z122 Encounter for screening for malignant neoplasm of respiratory organs: Secondary | ICD-10-CM

## 2023-07-18 ENCOUNTER — Ambulatory Visit: Payer: PPO | Admitting: Family

## 2023-07-18 ENCOUNTER — Encounter: Payer: Self-pay | Admitting: Family

## 2023-07-18 VITALS — BP 112/70 | HR 78 | Temp 98.2°F | Ht 76.0 in | Wt 204.6 lb

## 2023-07-18 DIAGNOSIS — J449 Chronic obstructive pulmonary disease, unspecified: Secondary | ICD-10-CM

## 2023-07-18 DIAGNOSIS — K760 Fatty (change of) liver, not elsewhere classified: Secondary | ICD-10-CM

## 2023-07-18 DIAGNOSIS — R21 Rash and other nonspecific skin eruption: Secondary | ICD-10-CM | POA: Diagnosis not present

## 2023-07-18 DIAGNOSIS — I159 Secondary hypertension, unspecified: Secondary | ICD-10-CM | POA: Diagnosis not present

## 2023-07-18 MED ORDER — CLOTRIMAZOLE-BETAMETHASONE 1-0.05 % EX CREA: 1.0000 | TOPICAL_CREAM | Freq: Two times a day (BID) | CUTANEOUS | 2 refills | Status: DC | PRN

## 2023-07-18 NOTE — Patient Instructions (Addendum)
 Nice to see you.    Fatty Liver Disease (Steatotic Liver Disease): What to Know  Your liver is an organ with many jobs. It makes proteins and helps change food into energy. It also gets rid of harmful things in your blood and absorbs vitamins from food. Fatty liver disease happens when too much fat builds up in your liver cells. It's also called steatotic liver disease. In many cases, fatty liver disease doesn't cause symptoms. But over time, it can cause irritation and swelling. This can lead to other liver problems, such as: Cirrhosis, or scarring of the liver. Liver cancer. Liver failure. What are the causes? Fatty liver disease may be caused by: Being overweight. Having: High cholesterol. High blood pressure. Cushing syndrome. Not getting enough nutrients in your diet. Other causes include: Certain drugs. Poisons. Some infections caused by a germ called a virus. What increases the risk? You're more likely to get fatty liver disease if: You drink alcohol. You're overweight. You have diabetes. You have hepatitis. You have a high triglyceride level. You're pregnant. What are the signs or symptoms? You may not have symptoms. If you do, they may include: Feeling weak and tired. Losing weight. Feeling like you may throw up. Throwing up. Jaundice. This is when your skin or the white parts of your eyes turn yellow. Swelling in your belly or legs. Tenderness in the right-upper part of your belly. How is this diagnosed? Fatty liver disease may be diagnosed based on your medical history and an exam. You may also need tests. These may include: Blood tests. An ultrasound. A CT scan. An MRI. A biopsy. This is when a small piece of tissue is removed from your liver for testing. How is this treated? Fatty liver disease is often caused by other conditions. You may need to take medicines and make changes to your daily life. These changes may help you manage conditions, such  as: Alcohol use disorder. This is a condition where you may not be able to stop drinking. High cholesterol. Diabetes. Being overweight. Follow these instructions at home: Eat healthy. Work with your health care provider or an expert in healthy eating called a dietitian. They can help you make an eating plan. Get enough exercise. This can help you lose weight. It can also help you manage your cholesterol and diabetes. Talk to your provider about an exercise plan. Ask what things are best for you to do. Do not drink alcohol. If you have trouble quitting, ask your provider for help. Take your medicines only as told. Keep all follow-up visits. Your provider will check if you're getting better. Contact a health care provider if: You can't control your blood sugar. This is extra important if you have diabetes. You have a fever. You have swelling in your belly or legs. You have belly pain. You have jaundice. You feel like you may throw up. You throw up. Get help right away if: You throw up, and it looks like: Bright red blood. Coffee grounds. You throw up something that looks like coffee ground. Your poop looks bloody or black. You get confused. These symptoms may be an emergency. Call 911 right away. Do not wait to see if the symptoms will go away. Do not drive yourself to the hospital. This information is not intended to replace advice given to you by your health care provider. Make sure you discuss any questions you have with your health care provider. Document Revised: 10/25/2022 Document Reviewed: 10/25/2022 Elsevier Patient Education  2024  Elsevier Inc.Nonalcoholic Fatty Liver Disease Diet, Adult Nonalcoholic fatty liver disease is a condition that causes fat to build up in and around the liver. The disease makes it harder for the liver to work the way that it should. Eating a healthy diet of fruits, vegetables, whole grains, lean proteins, and limiting added sugar and fats can  help to keep nonalcoholic fatty liver disease under control. It can also help to prevent or improve conditions that are related to the disease, such as heart disease, diabetes, high blood pressure, obesity, and high cholesterol. Along with regular exercise, this diet: Promotes weight loss. Helps to control blood sugar levels. Helps to improve the way that the body uses insulin. What are tips for following this plan? Reading food labels Always check food labels for: The amount of saturated fat in a food. You should limit how much saturated fat you eat. Saturated fat is found in foods that come from animals, including meat and dairy products such as butter, cheese, and whole milk. The amount of fiber in a food. You should choose high-fiber foods such as fruits, vegetables, and whole grains. Try to get 25-30 grams (g) of fiber a day. Added sugar. Avoid foods with a high amount of added sugar and high fructose corn syrup. Avoid sweetened soft drinks, sweetened tea, lemonade, sports drinks, and juices that are not 100% juice. Aim for foods with less than 5 grams of added sugar. Every 4 grams of added sugar is 1 teaspoon (tsp) of sugar per serving. Cooking When cooking, use heart-healthy oils that are high in monounsaturated fats. These include olive oil, canola oil, and avocado oil. Limit frying or deep-frying foods. Cook foods using healthy methods such as baking, boiling, steaming, and grilling instead. Meal planning You may want to keep track of how many calories you eat and drink. Eating the right amount of calories will help you achieve a healthy weight. Meeting with a dietitian can help you get started. Limit how often you eat takeout and fast food. These foods are usually very high in fat, salt, and sugar. Use the glycemic index (GI) to plan your meals. The index tells you how quickly a food will raise your blood sugar. Choose low-GI foods. Low-GI foods have a GI less than 55. These foods take a  longer time to raise blood sugar. A dietitian can help you pick foods that are lower on the GI scale. Try to include some meals each week that replace meat with beans or legumes. Add fish 2-3 times a week, especially heart healthy oily fishes like salmon, sardines, trout, tuna, or mackerel. Lifestyle You may want to follow a Mediterranean diet. This diet includes a lot of vegetables, lean meats or fish, nuts and seeds, whole grains, fruits, and healthy oils and fats. What foods can I eat?  Fruits Apples. Bananas. Pears. Grapes. Papaya. Plums. Kiwi. Grapefruit. Cherries. Strawberries. Vegetables Lettuce. Spinach. Peas. Beets. Cauliflower. Cabbage. Broccoli. Carrots. Tomatoes. Squash. Eggplant. Herbs. Peppers. Onions. Cucumbers. Brussels sprouts. Yams and sweet potatoes. Grains Whole wheat or whole-grain foods, including breads, crackers, cereals, and pasta. Stone-ground whole wheat. Unsweetened oatmeal. Bulgur. Barley. Quinoa. Brown or wild rice. Corn or whole wheat flour tortillas. Meats and other proteins Lean meats. Poultry. Tofu. Seafood and shellfish. Beans. Lentils. Dairy Low-fat or fat-free dairy products, such as yogurt, cottage cheese, or cheese. Beverages Water. Sugar-free drinks. Tea. Coffee. Low-fat or skim milk. Milk alternatives, such as unsweetened soy, oat, or almond milk. Real fruit juice. Fats and oils Avocado.  Canola or olive oil. Nuts and nut butters. Seeds. Seasonings and condiments Mustard. Relish. Low-fat, low-sugar ketchup and barbecue sauce. Low-fat or fat-free mayonnaise. Sweets and desserts Sugar-free sweets. The items listed above may not be all the foods and drinks you can have. Talk to a dietitian to learn more. What foods should I limit or avoid? Grains White rice. Pasta. Breads. Meats and other proteins Limit red meat to 1-2 times a week. Dairy [employer]. Fats and oils Palm oil and coconut oil. Fried foods. Other foods Processed foods. Foods  that contain a lot of salt (sodium) or added sugar. Sweets and desserts Sweets that contain sugar. Bakery items such as cookies, cakes, and other pastries. Beverages Sweetened drinks, such as sweet tea, milkshakes, iced sweet drinks, and sodas. Alcohol. The items listed above may not be all the foods and drinks you should avoid. Talk to a dietitian to learn more. Where to find more information The [employer] of Diabetes and Digestive and Kidney Diseases: [employer]Sync.si This information is not intended to replace advice given to you by your health care provider. Make sure you discuss any questions you have with your health care provider. Document Revised: 02/19/2022 Document Reviewed: 02/19/2022 Elsevier Patient Education  2024 [employer].

## 2023-07-25 NOTE — Assessment & Plan Note (Signed)
 Reviewed CT chest, B RADS 2.  COPD changes.  Advise repeat CT in 1 year

## 2023-07-25 NOTE — Assessment & Plan Note (Signed)
 Discussed hepatic steatosis, simple hepatic cyst less than 4 cm.  No further surveillance of hepatic cyst required as long as patient is asymptomatic without abdominal pain. Liver function tests in blood work have been normal. Advised lifestyle management is of upmost importance for hepatic steatosis.

## 2023-08-09 ENCOUNTER — Encounter: Payer: Self-pay | Admitting: Pulmonary Disease

## 2023-08-09 ENCOUNTER — Ambulatory Visit: Payer: PPO | Admitting: Pulmonary Disease

## 2023-08-09 VITALS — BP 126/78 | HR 92 | Temp 97.1°F | Ht 76.0 in | Wt 202.8 lb

## 2023-08-09 DIAGNOSIS — J9611 Chronic respiratory failure with hypoxia: Secondary | ICD-10-CM | POA: Diagnosis not present

## 2023-08-09 DIAGNOSIS — J449 Chronic obstructive pulmonary disease, unspecified: Secondary | ICD-10-CM

## 2023-08-09 DIAGNOSIS — Z87891 Personal history of nicotine dependence: Secondary | ICD-10-CM | POA: Diagnosis not present

## 2023-08-09 DIAGNOSIS — J690 Pneumonitis due to inhalation of food and vomit: Secondary | ICD-10-CM

## 2023-08-09 MED ORDER — AMOXICILLIN-POT CLAVULANATE 875-125 MG PO TABS: 1.0000 | ORAL_TABLET | Freq: Two times a day (BID) | ORAL | 0 refills | Status: DC

## 2023-08-09 NOTE — Progress Notes (Signed)
 Subjective:    Patient ID: Stephen Madden, male    DOB: Apr 26, 1951, 73 y.o.   MRN: [id number]  Patient Care Team: Allegra Grana, FNP as PCP - General (Family Medicine) Carmina Miller, MD as Radiation Oncologist (Radiation Oncology) Salena Saner, MD as Consulting Physician (Pulmonary Disease) Lockie Mola, MD as Referring Physician (Ophthalmology)  Chief Complaint  Patient presents with   Follow-up    DOE. Cough with clear. Wheezing right now.     BACKGROUND/INTERVAL: Patient is a 73 year old former smoker with known stage IV COPD and chronic hypoxic respiratory failure, who presents for a follow-up visit from 10 May 2023.  He has had no exacerbations in the interim.   HPI Discussed the use of AI scribe software for clinical note transcription with the patient, who gave verbal consent to proceed.  History of Present Illness   Stephen Madden is a 73 year old male who presents with respiratory symptoms after accidentally swallowing his inhaler medication causing some regurgitation.  He experiences respiratory symptoms after accidentally swallowing his medication, Breztri. This incident has occurred once before, leading to pneumonia-like symptoms. Currently, he feels that his condition is improving.  He describes experiencing symptoms primarily at night, stating that when he went to bed last night, he had to get up at least three times due to 'flame' coming up. He anticipates that his symptoms will worsen when he goes outside. No chest pain.  He recalls that the last time he was prescribed a generic amoxicillin, specifically Augmentin, which he found effective. It was a large tablet taken twice a day. He requests a similar prescription for five days, ensuring it does not cause drowsiness. He mentions that his insurance has changed, affecting his medication copay, but he does not require any refills on his current medication.   He has not had any fevers, chills or  sweats.  Cough has been mostly nonproductive, no hemoptysis.     DATA 12/29/2020 PFTs: FEV1 0.98 L or 26% predicted, FVC 2.72 L or 56% predicted, FEV1/FVC 36%. No bronchodilator response. There is air trapping but no hyperinflation.  No reversible component. Diffusion capacity moderately to severely impaired. Consistent with severe COPD on the basis of emphysema. 06/29/2021 LDCT, lung cancer screening: Lung RADS 2S, diffuse bronchial wall thickening with moderate centrilobular and paraseptal emphysema consistent with underlying COPD.  Two-vessel coronary artery disease suggested.  07/02/2022 LDCT, lung cancer screening: Lung RADS 2, benign appearance or behavior, continue annual screening.  Severe emphysema.  Coronary artery calcifications as prior. 09/17/2022 chest x-ray PA and lateral: Developing right midlung field pneumonia. 10/30/2022 chest x-ray PA and lateral: Significantly improved right midlung zone pneumonia with minimal residual pneumonia scarring.  Review of Systems A 10 point review of systems was performed and it is as noted above otherwise negative.   Patient Active Problem List   Diagnosis Date Noted   Hepatic steatosis 07/09/2023   Cough 06/20/2023   Polyp of transverse colon 05/28/2023   History of colonic polyps 05/28/2023   Smoking history 04/11/2023   Aortic atherosclerosis (HCC) 04/10/2023   HTN (hypertension) 11/07/2022   COPD (chronic obstructive pulmonary disease) (HCC) 12/21/2019   Nocturnal hypoxia 12/21/2019   Recurrent spontaneous pneumothorax 05/12/2018   Pneumothorax on left 02/24/2018   Hx of colonic polyps    Benign neoplasm of rectosigmoid junction    Polyp of sigmoid colon    History of elevated PSA 08/11/2015   Erectile dysfunction of organic origin 08/11/2015   Acute kidney injury (  HCC)    S/P partial colectomy 07/11/2015   Health care maintenance    Benign neoplasm of transverse colon    Benign neoplasm of descending colon    Benign neoplasm of  sigmoid colon    Rectal polyp    Prostate cancer (HCC) 01/10/2015   BPH with obstruction/lower urinary tract symptoms 01/10/2015    Social History   Tobacco Use   Smoking status: Former    Current packs/day: 0.00    Average packs/day: 1 pack/day for 30.0 years (30.0 ttl pk-yrs)    Types: Cigarettes    Start date: 11/20/1986    Quit date: 11/19/2016    Years since quitting: 6.7   Smokeless tobacco: Never  Substance Use Topics   Alcohol use: Not Currently    Comment: very rare use , 03/2023; last use 12/22/22, previous 1-2 shots 2 times a week    No Known Allergies  Current Meds  Medication Sig   amoxicillin-clavulanate (AUGMENTIN) 875-125 MG tablet Take 1 tablet by mouth 2 (two) times daily.   atenolol (TENORMIN) 50 MG tablet Take 1 tablet (50 mg total) by mouth daily.   atorvastatin (LIPITOR) 10 MG tablet Take 1 tablet (10 mg total) by mouth daily.   BREZTRI AEROSPHERE 160-9-4.8 MCG/ACT AERO INHALE 2 PUFFS INTO THE LUNGS IN THE MORNING AND AT BEDTIME.   brimonidine (ALPHAGAN) 0.2 % ophthalmic solution 1 drop 3 (three) times daily.   clotrimazole-betamethasone (LOTRISONE) cream Apply 1 Application topically 2 (two) times daily as needed (for irritation).   Glycerin (OPTASE DRY EYE INTENSE OP) Apply 1 drop to eye in the morning and at bedtime.   NIFEdipine (ADALAT CC) 30 MG 24 hr tablet Take 1 tablet (30 mg total) by mouth every morning.   OXYGEN Inhale 2 L into the lungs at bedtime.    Immunization History  Administered Date(s) Administered   Fluad Quad(high Dose 65+) 04/17/2021, 03/23/2022   Fluad Trivalent(High Dose 65+) 05/08/2023   Influenza, High Dose Seasonal PF 06/12/2017   Influenza-Unspecified 03/21/2018   Moderna Covid-19 Fall Seasonal Vaccine 42yrs & older 04/11/2023   Moderna SARS-COV2 Booster Vaccination 03/29/2020, 10/07/2020, 04/19/2021   Moderna Sars-Covid-2 Vaccination 06/11/2019, 07/10/2019   PNEUMOCOCCAL CONJUGATE-20 11/07/2022        Objective:      BP 126/78 (BP Location: Right Arm, Cuff Size: Large)   Pulse 92   Temp (!) 97.1 F (36.2 C)   Ht 6\' 4"  (1.93 m)   Wt 202 lb 12.8 oz (92 kg)   SpO2 94%   BMI 24.69 kg/m   SpO2: 94 % O2 Device: None (Room air)  GENERAL: Well-developed, well-nourished gentleman.  No acute distress.  Fully ambulatory, no conversational dyspnea. HEAD: Normocephalic, atraumatic.  EYES: Pupils equal, round, reactive to light.  No scleral icterus.  MOUTH: Oral mucosa moist, no thrush. NECK: Supple. No thyromegaly. Trachea midline. No JVD.  No adenopathy. PULMONARY: Good air entry bilaterally.  Coarse breath sounds, otherwise, no adventitious sounds. CARDIOVASCULAR: S1 and S2.  Tachycardic rate and regular rhythm.  No rubs, murmurs or gallops heard. ABDOMEN: Benign. MUSCULOSKELETAL: No joint deformity, no clubbing, no edema.  NEUROLOGIC: No focal deficit, no gait disturbance, speech is fluent. SKIN: Intact,warm,dry. PSYCH: Mood and behavior normal.     Assessment & Plan:     ICD-10-CM   1. Stage 4 very severe COPD by GOLD classification (HCC)  J44.9     2. Chronic respiratory failure with hypoxia (HCC)  J96.11     3. Aspiration pneumonitis (HCC) -  MILD  J69.0    By clinical impression    4. Former cigarette smoker  Z87.891      Meds ordered this encounter  Medications   amoxicillin-clavulanate (AUGMENTIN) 875-125 MG tablet    Sig: Take 1 tablet by mouth 2 (two) times daily.    Dispense:  10 tablet    Refill:  0   Discussion:    Aspiration pneumonitis He reports swallowing a medication, causing some regurgitation, leading to pneumonia-like symptoms. Symptoms are improving, and he requests antibiotics to expedite recovery. No chest pain reported. He has previously responded well to amoxicillin (Augmentin) for similar symptoms, preferring it due to its efficacy in clearing symptoms quickly without causing drowsiness. - Prescribe amoxicillin (Augmentin) for 5 days, twice daily - Send  prescription to [facility] [place]  Medication administration technique He describes an incident of swallowing medication instead of proper inhalation, potentially contributing to respiratory symptoms. He uses a technique of inhaling medication multiple times and then brushing teeth to clear residue. He declined the use of a spacer, preferring his current method of administration. - Educate on proper inhalation technique to prevent swallowing medication       Advised if symptoms do not improve or worsen, to please contact office for sooner follow up or seek emergency care.    I spent 34 minutes of dedicated to the care of this patient on the date of this encounter to include pre-visit review of records, face-to-face time with the patient discussing conditions above, post visit ordering of testing, clinical documentation with the electronic health record, making appropriate referrals as documented, and communicating necessary findings to members of the patients care team.     C. Danice Goltz, MD Advanced Bronchoscopy PCCM [facility]    *This note was generated using voice recognition software/Dragon and/or AI transcription program.  Despite best efforts to proofread, errors can occur which can change the meaning. Any transcriptional errors that result from this process are unintentional and may not be fully corrected at the time of dictation.

## 2023-08-09 NOTE — Patient Instructions (Signed)
 VISIT SUMMARY:  Stephen Madden, during your visit, we discussed your respiratory symptoms that began after accidentally swallowing your medication. You mentioned that this has happened before and led to pneumonia-like symptoms. Currently, you feel that your condition is improving. We also reviewed your medication administration technique to prevent future incidents.  YOUR PLAN:  -ASPIRATION PNEUMONIA: Aspiration pneumonia occurs when food, liquid, or medication is inhaled into the lungs, causing an infection. You reported swallowing a medication, leading to pneumonia-like symptoms. Since you have responded well to amoxicillin (Augmentin) in the past, I have prescribed it for 5 days, to be taken twice daily. The [facility].  -MEDICATION ADMINISTRATION TECHNIQUE: Proper medication administration is crucial to avoid respiratory issues. You described an incident of swallowing medication instead of proper inhalation. I have provided education on the correct inhalation technique to prevent swallowing medication in the future.  INSTRUCTIONS:  Please follow up if your symptoms do not improve or if they worsen. Ensure you complete the full course of antibiotics as prescribed. If you have any questions about the inhalation technique, do not hesitate to reach out.

## 2023-09-18 ENCOUNTER — Ambulatory Visit: Admitting: *Deleted

## 2023-09-18 VITALS — BP 127/82 | HR 67 | Ht 76.0 in | Wt 205.0 lb

## 2023-09-18 DIAGNOSIS — Z0000 Encounter for general adult medical examination without abnormal findings: Secondary | ICD-10-CM

## 2023-09-18 NOTE — Patient Instructions (Signed)
 Mr. Stephen Madden , Thank you for taking time to come for your Medicare Wellness Visit. I appreciate your ongoing commitment to your health goals. Please review the following plan we discussed and let me know if I can assist you in the future.   Referrals/Orders/Follow-Ups/Clinician Recommendations: Remember to update your tetanus (tdap) and shingles vaccines.  This is a list of the screening recommended for you and due dates:  Health Maintenance  Topic Date Due   DTaP/Tdap/Td vaccine (1 - Tdap) Never done   Zoster (Shingles) Vaccine (1 of 2) Never done   COVID-19 Vaccine (4 - Moderna risk 2024-25 season) 10/09/2023   Flu Shot  12/20/2023   Screening for Lung Cancer  07/03/2024   Medicare Annual Wellness Visit  09/17/2024   Colon Cancer Screening  05/27/2028   Pneumonia Vaccine  Completed   Hepatitis C Screening  Completed   HPV Vaccine  Aged Out   Meningitis B Vaccine  Aged Out    Advanced directives: (Declined) Advance directive discussed with you today. Even though you declined this today, please call our office should you change your mind, and we can give you the proper paperwork for you to fill out.  Next Medicare Annual Wellness Visit scheduled for next year: Yes 09/21/24 @ 10:10

## 2023-09-18 NOTE — Progress Notes (Signed)
 Subjective:   Stephen Madden is a 73 y.o. who presents for a Medicare Wellness preventive visit.  Visit Complete: Virtual I connected with  Stephen Madden on 09/18/23 by a audio enabled telemedicine application and verified that I am speaking with the correct person using two identifiers.  Patient Location: Home  Provider Location: Office/Clinic  I discussed the limitations of evaluation and management by telemedicine. The patient expressed understanding and agreed to proceed.  Vital Signs: Because this visit was a virtual/telehealth visit, some criteria may be missing or patient reported. Any vitals not documented were not able to be obtained and vitals that have been documented are patient reported.  VideoDeclined- This patient declined Librarian, academic. Therefore the visit was completed with audio only.  Persons Participating in Visit: Patient.  AWV Questionnaire: No: Patient Medicare AWV questionnaire was not completed prior to this visit.  Cardiac Risk Factors include: advanced age (>60men, >40 women);male gender;hypertension     Objective:    Today's Vitals   09/18/23 0931  BP: 127/82  Pulse: 67  SpO2: 96%  Weight: 205 lb (93 kg)  Height: 6\' 4"  (1.93 m)   Body mass index is 24.95 kg/m.     09/18/2023    9:49 AM 05/28/2023   10:17 AM 01/30/2023    1:45 PM 01/09/2023    8:47 AM 12/26/2022    9:03 AM 03/26/2022   11:33 AM 04/20/2021   10:59 AM  Advanced Directives  Does Patient Have a Medical Advance Directive? No No No No No No No  Would patient like information on creating a medical advance directive? No - Patient declined  No - Patient declined No - Patient declined No - Patient declined No - Patient declined No - Patient declined    Current Medications (verified) Outpatient Encounter Medications as of 09/18/2023  Medication Sig   atenolol  (TENORMIN ) 50 MG tablet Take 1 tablet (50 mg total) by mouth daily.   atorvastatin  (LIPITOR) 10 MG  tablet Take 1 tablet (10 mg total) by mouth daily.   BREZTRI  AEROSPHERE 160-9-4.8 MCG/ACT AERO INHALE 2 PUFFS INTO THE LUNGS IN THE MORNING AND AT BEDTIME.   brimonidine  (ALPHAGAN ) 0.2 % ophthalmic solution 1 drop 3 (three) times daily.   Budeson-Glycopyrrol-Formoterol (BREZTRI  AEROSPHERE) 160-9-4.8 MCG/ACT AERO Inhale 2 puffs into the lungs in the morning and at bedtime.   chlorpheniramine (CHLOR-TRIMETON) 4 MG tablet Take 4 mg by mouth daily.   clotrimazole -betamethasone  (LOTRISONE ) cream Apply 1 Application topically 2 (two) times daily as needed (for irritation).   NIFEdipine  (ADALAT  CC) 30 MG 24 hr tablet Take 1 tablet (30 mg total) by mouth every morning.   OXYGEN  Inhale 2 L into the lungs at bedtime.   dorzolamide (TRUSOPT) 2 % ophthalmic solution 1 drop 2 (two) times daily. (Patient not taking: Reported on 09/18/2023)   Glycerin (OPTASE DRY EYE INTENSE OP) Apply 1 drop to eye in the morning and at bedtime. (Patient not taking: Reported on 09/18/2023)   latanoprost  (XALATAN ) 0.005 % ophthalmic solution Place 1 drop into both eyes at bedtime. (Patient not taking: Reported on 09/18/2023)   timolol  (BETIMOL ) 0.5 % ophthalmic solution Place 1 drop into both eyes 2 (two) times daily. (Patient not taking: Reported on 09/18/2023)   [DISCONTINUED] amoxicillin -clavulanate (AUGMENTIN ) 875-125 MG tablet Take 1 tablet by mouth 2 (two) times daily. (Patient not taking: Reported on 09/18/2023)   No facility-administered encounter medications on file as of 09/18/2023.    Allergies (verified) Patient has no  known allergies.   History: Past Medical History:  Diagnosis Date   BPH with obstruction/lower urinary tract symptoms    Cancer [facility])    prostate   Chronic respiratory failure with hypoxia, on home O2 therapy (HCC)    COPD (chronic obstructive pulmonary disease) (HCC)    History of prostate cancer    Hypertension    Pneumothorax 02/23/2018   left   Primary open angle glaucoma    prostate cancer  08/20/2018   Stage 4 very severe COPD by GOLD classification (HCC)    Tobacco abuse    Wears dentures    partial lower   Past Surgical History:  Procedure Laterality Date   BIOPSY  05/28/2023   Procedure: BIOPSY;  Surgeon: Marnee Sink, MD;  Location: [facility] ENDOSCOPY;  Service: Endoscopy;;   CATARACT EXTRACTION W/PHACO Left 12/26/2022   Procedure: CATARACT EXTRACTION PHACO AND INTRAOCULAR LENS PLACEMENT (IOC) LEFT KAHOOK DUAL BLADE GONIOTOMY 3.48 00:29.3;  Surgeon: Annell Kidney, MD;  Location: [facility];  Service: Ophthalmology;  Laterality: Left;   CATARACT EXTRACTION W/PHACO Right 01/09/2023   Procedure: CATARACT EXTRACTION PHACO AND INTRAOCULAR LENS PLACEMENT (IOC) RIGH KAHOOK DUAL BLADE GONIOTOMY 5.39 00:26.8;  Surgeon: Annell Kidney, MD;  Location: [facility];  Service: Ophthalmology;  Laterality: Right;   COLON SURGERY     COLONOSCOPY WITH PROPOFOL  N/A 05/30/2015   Procedure: COLONOSCOPY WITH PROPOFOL ;  Surgeon: Marnee Sink, MD;  Location: [facility];  Service: Endoscopy;  Laterality: N/A;   COLONOSCOPY WITH PROPOFOL  N/A 08/28/2016   Procedure: COLONOSCOPY WITH PROPOFOL ;  Surgeon: Marnee Sink, MD;  Location: [facility] ENDOSCOPY;  Service: Endoscopy;  Laterality: N/A;   COLONOSCOPY WITH PROPOFOL  N/A 05/28/2023   Procedure: COLONOSCOPY WITH PROPOFOL ;  Surgeon: Marnee Sink, MD;  Location: [facility] ENDOSCOPY;  Service: Endoscopy;  Laterality: N/A;   CYSTOSCOPY W/ RETROGRADES Bilateral 07/11/2015   Procedure: CYSTOSCOPY WITH RETROGRADE PYELOGRAM;  Surgeon: Dustin Gimenez, MD;  Location: [facility] ORS;  Service: Urology;  Laterality: Bilateral;   CYSTOSCOPY WITH STENT PLACEMENT Bilateral 07/11/2015   Procedure: CYSTOSCOPY WITH STENT PLACEMENT;  Surgeon: Dustin Gimenez, MD;  Location: [facility] ORS;  Service: Urology;  Laterality: Bilateral;   LAPAROSCOPIC PARTIAL COLECTOMY N/A 07/11/2015   Procedure: Laparoscopic right hemicolectomy;  Surgeon: Kandis Ormond, MD;  Location:  [facility] ORS;  Service: General;  Laterality: N/A;   POLYPECTOMY  05/30/2015   Procedure: POLYPECTOMY;  Surgeon: Marnee Sink, MD;  Location: [facility];  Service: Endoscopy;;   POLYPECTOMY  05/28/2023   Procedure: POLYPECTOMY;  Surgeon: Marnee Sink, MD;  Location: [facility] ENDOSCOPY;  Service: Endoscopy;;   VIDEO ASSISTED THORACOSCOPY (VATS) W/TALC  PLEUADESIS Left 05/15/2018   Procedure: VIDEO ASSISTED THORACOSCOPY (VATS) W/TALC  PLEUADESIS;  Surgeon: Petra Brandy, MD;  Location: [facility] ORS;  Service: Thoracic;  Laterality: Left;   VOLUME STUDY N/A 07/22/2018   Procedure: VOLUME STUDY;  Surgeon: Geraline Knapp, MD;  Location: [facility] ORS;  Service: Urology;  Laterality: N/A;   Family History  Problem Relation Age of Onset   Hypertension Mother    Heart disease Mother    Heart attack Mother 49   Diabetes Father    Hypertension Father    Stroke Father 31   Cancer Sister 83       unsure of type   Hypertension Brother    Gout Brother    Kidney disease Neg Hx    Prostate cancer Neg Hx    Bladder Cancer Neg Hx    Social History   Socioeconomic History  Marital status: Married    Spouse name: Rosa   Number of children: 1   Years of education: Not on file   Highest education level: Not on file  Occupational History   Occupation: [employer]    Comment: retired  Tobacco Use   Smoking status: Former    Current packs/day: 0.00    Average packs/day: 1 pack/day for 30.0 years (30.0 ttl pk-yrs)    Types: Cigarettes    Start date: 11/20/1986    Quit date: 11/19/2016    Years since quitting: 6.8   Smokeless tobacco: Never  Vaping Use   Vaping status: Never Used  Substance and Sexual Activity   Alcohol use: Not Currently    Comment: very rare use , 03/2023; last use 12/22/22, previous 1-2 shots 2 times a week   Drug use: No   Sexual activity: Yes  Other Topics Concern   Not on file  Social History Narrative   Born in [place]   From [place]   Lives with wife , step daughter    Gun in home      He has son in [place]; he has 2 granddaughters         1 brother in [place]   1 brother in [place]   1 sister in [place]   1 brother [place]      1 sister passed      Social Drivers of Corporate investment banker Strain: Low Risk  (09/18/2023)   Overall Financial Resource Strain (CARDIA)    Difficulty of Paying Living Expenses: Not hard at all  Food Insecurity: No Food Insecurity (09/18/2023)   Hunger Vital Sign    Worried About Running Out of Food in the Last Year: Never true    Ran Out of Food in the Last Year: Never true  Transportation Needs: No Transportation Needs (09/18/2023)   PRAPARE - Administrator, Civil Service (Medical): No    Lack of Transportation (Non-Medical): No  Physical Activity: Insufficiently Active (09/18/2023)   Exercise Vital Sign    Days of Exercise per Week: 3 days    Minutes of Exercise per Session: 20 min  Stress: No Stress Concern Present (09/18/2023)   [employer] of Occupational Health - Occupational Stress Questionnaire    Feeling of Stress : Only a little  Social Connections: Moderately Integrated (09/18/2023)   Social Connection and Isolation Panel [NHANES]    Frequency of Communication with Friends and Family: More than three times a week    Frequency of Social Gatherings with Friends and Family: Once a week    Attends Religious Services: More than 4 times per year    Active Member of [employer] or Organizations: No    Attends Banker Meetings: Never    Marital Status: Married    Tobacco Counseling Counseling given: Not Answered    Clinical Intake:  Pre-visit preparation completed: Yes  Pain : No/denies pain     BMI - recorded: 24.95 Nutritional Status: BMI of 19-24  Normal Nutritional Risks: None Diabetes: No  Lab Results  Component Value Date   HGBA1C 6.4 04/10/2023     How often do you need to have someone help you when you read instructions, pamphlets, or other written  materials from your doctor or pharmacy?: 1 - Never  Interpreter Needed?: No  Information entered by :: R. Arnold Depinto LPN   Activities of Daily Living     09/18/2023    9:34 AM 01/30/2023  1:28 PM  In your present state of health, do you have any difficulty performing the following activities:  Hearing? 0 0  Vision? 0 0  Difficulty concentrating or making decisions? 0 0  Walking or climbing stairs? 1 0  Dressing or bathing? 0 0  Doing errands, shopping? 0 0  Preparing Food and eating ? N N  Using the Toilet? N N  In the past six months, have you accidently leaked urine? N N  Do you have problems with loss of bowel control? N N  Managing your Medications? N N  Managing your Finances? N N  Housekeeping or managing your Housekeeping? N N    Patient Care Team: Calista Catching, FNP as PCP - General (Family Medicine) Glenis Langdon, MD as Radiation Oncologist (Radiation Oncology) Marc Senior, MD as Consulting Physician (Pulmonary Disease) Annell Kidney, MD as Referring Physician (Ophthalmology)  Indicate any recent Medical Services you may have received from other than [facility] providers in the past year (date may be approximate).     Assessment:   This is a routine wellness examination for Darion.  Hearing/Vision screen Hearing Screening - Comments:: No issues Vision Screening - Comments:: readers   Goals Addressed             This Visit's Progress    Patient Stated       Wants to stay active and get more exercise       Depression Screen     09/18/2023    9:44 AM 06/20/2023    1:41 PM 04/24/2023   12:43 PM 04/24/2023   12:23 PM 04/10/2023    9:35 AM 01/30/2023    1:43 PM 11/07/2022   10:45 AM  PHQ 2/9 Scores  PHQ - 2 Score 1 1 0 0 0 0 0  PHQ- 9 Score 2 4 0 0 0 0 0    Fall Risk     09/18/2023    9:38 AM 06/20/2023    1:41 PM 04/24/2023   12:23 PM 04/10/2023    9:35 AM 01/30/2023    1:46 PM  Fall Risk   Falls in the past year? 0 0 0 0 0  Number  falls in past yr: 0 0 0 0 0  Injury with Fall? 0 0 0 0 0  Risk for fall due to : No Fall Risks No Fall Risks No Fall Risks No Fall Risks No Fall Risks  Follow up Falls prevention discussed;Falls evaluation completed Falls evaluation completed Falls evaluation completed Falls evaluation completed Falls prevention discussed    MEDICARE RISK AT HOME:  Medicare Risk at Home Any stairs in or around the home?: Yes If so, are there any without handrails?: No Home free of loose throw rugs in walkways, pet beds, electrical cords, etc?: Yes Adequate lighting in your home to reduce risk of falls?: Yes Life alert?: No Use of a cane, walker or w/c?: No Grab bars in the bathroom?: No Shower chair or bench in shower?: No Elevated toilet seat or a handicapped toilet?: No  TIMED UP AND GO:  Was the test performed?  No  Cognitive Function: 6CIT completed    04/20/2021   11:02 AM  MMSE - Mini Mental State Exam  Not completed: Unable to complete        09/18/2023    9:49 AM 01/30/2023    1:47 PM 04/20/2021   11:03 AM  6CIT Screen  What Year? 0 points 0 points 0 points  What month? 0 points  0 points 0 points  What time? 0 points 0 points 0 points  Count back from 20 0 points 0 points 0 points  Months in reverse 0 points 0 points 0 points  Repeat phrase 0 points 0 points 0 points  Total Score 0 points 0 points 0 points    Immunizations Immunization History  Administered Date(s) Administered   Fluad Quad(high Dose 65+) 04/17/2021, 03/23/2022   Fluad Trivalent(High Dose 65+) 05/08/2023   Influenza, High Dose Seasonal PF 06/12/2017   Influenza-Unspecified 03/21/2018   Moderna Covid-19 Fall Seasonal Vaccine 47yrs & older 04/11/2023   Moderna SARS-COV2 Booster Vaccination 03/29/2020, 10/07/2020, 04/19/2021   Moderna Sars-Covid-2 Vaccination 06/11/2019, 07/10/2019   PNEUMOCOCCAL CONJUGATE-20 11/07/2022    Screening Tests Health Maintenance  Topic Date Due   DTaP/Tdap/Td (1 - Tdap) Never  done   Zoster Vaccines- Shingrix (1 of 2) Never done   COVID-19 Vaccine (4 - Moderna risk 2024-25 season) 10/09/2023   INFLUENZA VACCINE  12/20/2023   Lung Cancer Screening  07/03/2024   Medicare Annual Wellness (AWV)  09/17/2024   Colonoscopy  05/27/2028   Pneumonia Vaccine 79+ Years old  Completed   Hepatitis C Screening  Completed   HPV VACCINES  Aged Out   Meningococcal B Vaccine  Aged Out    Health Maintenance  Health Maintenance Due  Topic Date Due   DTaP/Tdap/Td (1 - Tdap) Never done   Zoster Vaccines- Shingrix (1 of 2) Never done   Health Maintenance Items Addressed: Discussed the need to update Tetanus (Tdap) and shingles vaccines.  Additional Screening:  Vision Screening: Recommended annual ophthalmology exams for early detection of glaucoma and other disorders of the eye. Up to date [facility]  Dental Screening: Recommended annual dental exams for proper oral hygiene  Community Resource Referral / Chronic Care Management: CRR required this visit?  No   CCM required this visit?  No     Plan:     I have personally reviewed and noted the following in the patient's chart:   Medical and social history Use of alcohol, tobacco or illicit drugs  Current medications and supplements including opioid prescriptions. Patient is not currently taking opioid prescriptions. Functional ability and status Nutritional status Physical activity Advanced directives List of other physicians Hospitalizations, surgeries, and ER visits in previous 12 months Vitals Screenings to include cognitive, depression, and falls Referrals and appointments  In addition, I have reviewed and discussed with patient certain preventive protocols, quality metrics, and best practice recommendations. A written personalized care plan for preventive services as well as general preventive health recommendations were provided to patient.     Felicitas Horse, LPN   08/27/8117   After Visit Summary:  (Pick Up) Due to this being a telephonic visit, with patients personalized plan was offered to patient and patient has requested to Pick up at office.  Notes: Please refer to Routing Comments.

## 2023-09-19 ENCOUNTER — Telehealth: Payer: Self-pay

## 2023-09-19 NOTE — Telephone Encounter (Signed)
 LVM to call back to schedule per margaret  Last seen 06/2023  He is due for follow up 6 months after ; schedule August or September timeframe 2025

## 2023-10-21 DIAGNOSIS — H40003 Preglaucoma, unspecified, bilateral: Secondary | ICD-10-CM | POA: Diagnosis not present

## 2023-10-26 ENCOUNTER — Other Ambulatory Visit: Payer: Self-pay | Admitting: Family

## 2023-10-26 DIAGNOSIS — I159 Secondary hypertension, unspecified: Secondary | ICD-10-CM

## 2023-10-28 DIAGNOSIS — H401133 Primary open-angle glaucoma, bilateral, severe stage: Secondary | ICD-10-CM | POA: Diagnosis not present

## 2023-10-28 DIAGNOSIS — Z961 Presence of intraocular lens: Secondary | ICD-10-CM | POA: Diagnosis not present

## 2023-10-30 ENCOUNTER — Other Ambulatory Visit: Payer: Self-pay | Admitting: Family

## 2023-10-30 DIAGNOSIS — I159 Secondary hypertension, unspecified: Secondary | ICD-10-CM

## 2023-10-30 NOTE — Telephone Encounter (Signed)
 Copied from CRM [id number]. Topic: Clinical - Medication Refill >> Oct 30, 2023  3:53 PM Chuck Crater wrote: Medication: atenolol  (TENORMIN ) 50 MG tablet   Has the patient contacted their pharmacy? Yes (Agent: If no, request that the patient contact the pharmacy for the refill. If patient does not wish to contact the pharmacy document the reason why and proceed with request.) (Agent: If yes, when and what did the pharmacy advise?) no response from doctor  This is the patient's preferred pharmacy:  [facility] [address] [place] - [address] [place] [address] Phone: [phone] Fax: [phone]  Is this the correct pharmacy for this prescription? Yes If no, delete pharmacy and type the correct one.   Has the prescription been filled recently? No  Is the patient out of the medication? No 2-3 left  Has the patient been seen for an appointment in the last year OR does the patient have an upcoming appointment? Yes  Can we respond through MyChart? No  Agent: Please be advised that Rx refills may take up to 3 business days. We ask that you follow-up with your pharmacy.

## 2023-11-01 MED ORDER — ATENOLOL 50 MG PO TABS: 50.0000 mg | ORAL_TABLET | Freq: Every day | ORAL | 0 refills | Status: DC

## 2023-11-15 ENCOUNTER — Telehealth: Payer: Self-pay

## 2023-11-15 NOTE — Telephone Encounter (Signed)
 Copied from CRM [id number]. Topic: Clinical - Order For Equipment >> Nov 15, 2023 12:33 PM Russell PARAS wrote: Reason for CRM:   Velia, with [employer], is contacting clinic regarding the signed medical necessity form that was sent for pt's re qualification for oxygen .   Rep states they are needing some items corrected. Reports there is no diagnosis code on form associated with the testing performed.   Also testing from 2023 was sent with form from clinic; however, they already have qualifying testing from 2021 that is usable. If clinic wishes to use the 2023 testing, they will need to review prior to accepting.   Requests completion of diagnosis code associated with testing, and if would like to use the 2021 testing held by [employer]-please remove 2023 testing unless would like them reviewed.   CB#  [phone] [id number]  FX  [phone]

## 2023-11-20 NOTE — Telephone Encounter (Signed)
 I have not seen a form lately.

## 2023-11-27 ENCOUNTER — Telehealth: Payer: Self-pay

## 2023-11-27 NOTE — Telephone Encounter (Signed)
 Copied from CRM [id number]. Topic: Medical Record Request - Records Request >> Nov 21, 2023  4:48 PM Devaughn RAMAN wrote: Reason for CRM: Lilly with [employer] called regarding a certificate of Medical Necessity form to be faxed to  [employer] [facility] Fax-[phone] Phone-[phone]  Attn: 5 year Requalification Dept  I have not received a CMN for this. Routing to the front desk to look out for this.

## 2023-11-27 NOTE — Telephone Encounter (Signed)
 Forms completed and faxed back to [employer]. NFN.

## 2023-11-28 NOTE — Telephone Encounter (Signed)
 Stephen Madden- Have you seen this CRM by chance?

## 2023-11-28 NOTE — Telephone Encounter (Signed)
 Called [employer] [facility] to confirm the provider listed on the CMN and they were able to confirm that this was sent for Dr. Tamea.   Verified with the [facility] location that they have receive the CMN and have completed it. Has been faxed x4 to [employer].   Nothing further needed at this time. If any further issues, please verify with the [facility] provider office for verification or an additional fax number from [employer] in the event that the attempts are unsuccessful.

## 2023-11-28 NOTE — Telephone Encounter (Signed)
 Copied from CRM [id number]. Topic: Medical Record Request - Records Request >> Nov 21, 2023  4:48 PM Stephen Madden wrote: Reason for CRM: Stephen Madden with [employer] called regarding Madden certificate of Medical Necessity form to be faxed to  [employer] [facility] Fax-[phone] Phone-[phone]  Attn: 5 year Requalification Dept >> Nov 28, 2023 11:38 AM Stephen Madden wrote: Stephen Madden from [facility] calling for any updates, states paperwork is missing diagnosis code.   ROUTING TO ADMIN POOL TO SEE IF THIS HAS BEEN RECEIVED.

## 2024-01-10 ENCOUNTER — Other Ambulatory Visit: Payer: Self-pay | Admitting: Family

## 2024-01-10 ENCOUNTER — Other Ambulatory Visit: Payer: Self-pay | Admitting: Pulmonary Disease

## 2024-01-10 DIAGNOSIS — I159 Secondary hypertension, unspecified: Secondary | ICD-10-CM

## 2024-01-21 ENCOUNTER — Encounter: Payer: Self-pay | Admitting: Family

## 2024-01-21 ENCOUNTER — Ambulatory Visit: Admitting: Family

## 2024-01-21 VITALS — BP 128/78 | HR 78 | Temp 98.1°F | Ht 76.0 in | Wt 205.8 lb

## 2024-01-21 DIAGNOSIS — I700 Atherosclerosis of aorta: Secondary | ICD-10-CM | POA: Diagnosis not present

## 2024-01-21 DIAGNOSIS — I159 Secondary hypertension, unspecified: Secondary | ICD-10-CM | POA: Diagnosis not present

## 2024-01-21 DIAGNOSIS — Z23 Encounter for immunization: Secondary | ICD-10-CM

## 2024-01-21 MED ORDER — ATENOLOL 25 MG PO TABS: 25.0000 mg | ORAL_TABLET | Freq: Every day | ORAL | 3 refills | Status: AC

## 2024-01-21 NOTE — Progress Notes (Unsigned)
   Assessment & Plan:  Secondary hypertension -     Atenolol ; Take 1 tablet (25 mg total) by mouth daily.  Dispense: 90 tablet; Refill: 3     Return precautions given.   Risks, benefits, and alternatives of the medications and treatment plan prescribed today were discussed, and patient expressed understanding.   Education regarding symptom management and diagnosis given to patient on AVS either electronically or printed.  No follow-ups on file.  Rollene Northern, FNP  Subjective:    Patient ID: Stephen Madden, male    DOB: 1950-09-02, 73 y.o.   MRN: [id number]  CC: Stephen Madden is a 73 y.o. male who presents today for follow up.   HPI: Feels well today He is seeing more low end blood 107/72 of late.  He is eating less processed foods and eating healthier.  Weight is stable.   Breathing is stable. Breztri  is helfpul.   Denies urinary hesistancy.   Continues to follow with pulmonology, Dr. Tamea, COPD.  Last seen 08/09/2023.  Treated for aspiration pneumonitis CT lung cancer screening up-to-date 07/15/2023 Allergies: Patient has no known allergies. Current Outpatient Medications on File Prior to Visit  Medication Sig Dispense Refill   atorvastatin  (LIPITOR) 10 MG tablet Take 1 tablet (10 mg total) by mouth daily. 90 tablet 3   BREZTRI  AEROSPHERE 160-9-4.8 MCG/ACT AERO inhaler INHALE 2 PUFFS INTO THE LUNGS IN THE MORNING AND AT BEDTIME. 10.7 each 11   brimonidine  (ALPHAGAN ) 0.2 % ophthalmic solution 1 drop 3 (three) times daily.     Budeson-Glycopyrrol-Formoterol (BREZTRI  AEROSPHERE) 160-9-4.8 MCG/ACT AERO Inhale 2 puffs into the lungs in the morning and at bedtime. 11.8 g 0   NIFEdipine  (ADALAT  CC) 30 MG 24 hr tablet TAKE 1 TABLET BY MOUTH EVERY MORNING 90 tablet 3   OXYGEN  Inhale 2 L into the lungs at bedtime.     clotrimazole -betamethasone  (LOTRISONE ) cream Apply 1 Application topically 2 (two) times daily as needed (for irritation). (Patient not taking: Reported on 01/21/2024)  30 g 2   timolol  (BETIMOL ) 0.5 % ophthalmic solution Place 1 drop into both eyes 2 (two) times daily. (Patient not taking: Reported on 01/21/2024)     No current facility-administered medications on file prior to visit.    Review of Systems    Objective:    BP 128/78   Pulse 78   Temp 98.1 F (36.7 C) (Oral)   Ht 6' 4 (1.93 m)   Wt 205 lb 12.8 oz (93.4 kg)   SpO2 96%   BMI 25.05 kg/m  BP Readings from Last 3 Encounters:  01/21/24 128/78  09/18/23 127/82  08/09/23 126/78   Wt Readings from Last 3 Encounters:  01/21/24 205 lb 12.8 oz (93.4 kg)  09/18/23 205 lb (93 kg)  08/09/23 202 lb 12.8 oz (92 kg)    Physical Exam

## 2024-01-21 NOTE — Patient Instructions (Signed)
 Due to low in blood pressures at home.  Trial decrease atenolol  from 50 mg daily to atenolol  25 mg daily.  Goal of blood pressure less than 130/80.  Managing Your Hypertension Hypertension, also called high blood pressure, is when the force of the blood pressing against the walls of the arteries is too strong. Arteries are blood vessels that carry blood from your heart throughout your body. Hypertension forces the heart to work harder to pump blood and may cause the arteries to become narrow or stiff. Understanding blood pressure readings A blood pressure reading includes a higher number over a lower number: The first, or top, number is called the systolic pressure. It is a measure of the pressure in your arteries as your heart beats. The second, or bottom number, is called the diastolic pressure. It is a measure of the pressure in your arteries as the heart relaxes. For most people, a normal blood pressure is below 120/80. Your personal target blood pressure may vary depending on your medical conditions, your age, and other factors. Blood pressure is classified into four stages. Based on your blood pressure reading, your health care provider may use the following stages to determine what type of treatment you need, if any. Systolic pressure and diastolic pressure are measured in a unit called millimeters of mercury (mmHg). Normal Systolic pressure: below 120. Diastolic pressure: below 80. Elevated Systolic pressure: 120-129. Diastolic pressure: below 80. Hypertension stage 1 Systolic pressure: 130-139. Diastolic pressure: 80-89. Hypertension stage 2 Systolic pressure: 140 or above. Diastolic pressure: 90 or above. How can this condition affect me? Managing your hypertension is very important. Over time, hypertension can damage the arteries and decrease blood flow to parts of the body, including the brain, heart, and kidneys. Having untreated or uncontrolled hypertension can lead to: A heart  attack. A stroke. A weakened blood vessel (aneurysm). Heart failure. Kidney damage. Eye damage. Memory and concentration problems. Vascular dementia. What actions can I take to manage this condition? Hypertension can be managed by making lifestyle changes and possibly by taking medicines. Your health care provider will help you make a plan to bring your blood pressure within a normal range. You may be referred for counseling on a healthy diet and physical activity. Nutrition  Eat a diet that is high in fiber and potassium, and low in salt (sodium), added sugar, and fat. An example eating plan is called the DASH diet. DASH stands for Dietary Approaches to Stop Hypertension. To eat this way: Eat plenty of fresh fruits and vegetables. Try to fill one-half of your plate at each meal with fruits and vegetables. Eat whole grains, such as whole-wheat pasta, brown rice, or whole-grain bread. Fill about one-fourth of your plate with whole grains. Eat low-fat dairy products. Avoid fatty cuts of meat, processed or cured meats, and poultry with skin. Fill about one-fourth of your plate with lean proteins such as fish, chicken without skin, beans, eggs, and tofu. Avoid pre-made and processed foods. These tend to be higher in sodium, added sugar, and fat. Reduce your daily sodium intake. Many people with hypertension should eat less than 1,500 mg of sodium a day. Lifestyle  Work with your health care provider to maintain a healthy body weight or to lose weight. Ask what an ideal weight is for you. Get at least 30 minutes of exercise that causes your heart to beat faster (aerobic exercise) most days of the week. Activities may include walking, swimming, or biking. Include exercise to strengthen your muscles (  resistance exercise), such as weight lifting, as part of your weekly exercise routine. Try to do these types of exercises for 30 minutes at least 3 days a week. Do not use any products that contain  nicotine or tobacco. These products include cigarettes, chewing tobacco, and vaping devices, such as e-cigarettes. If you need help quitting, ask your health care provider. Control any long-term (chronic) conditions you have, such as high cholesterol or diabetes. Identify your sources of stress and find ways to manage stress. This may include meditation, deep breathing, or making time for fun activities. Alcohol use Do not drink alcohol if: Your health care provider tells you not to drink. You are pregnant, may be pregnant, or are planning to become pregnant. If you drink alcohol: Limit how much you have to: 0-1 drink a day for women. 0-2 drinks a day for men. Know how much alcohol is in your drink. In the [place] one drink equals one 12 oz bottle of beer (355 mL), one 5 oz glass of wine (148 mL), or one 1 oz glass of hard liquor (44 mL). Medicines Your health care provider may prescribe medicine if lifestyle changes are not enough to get your blood pressure under control and if: Your systolic blood pressure is 130 or higher. Your diastolic blood pressure is 80 or higher. Take medicines only as told by your health care provider. Follow the directions carefully. Blood pressure medicines must be taken as told by your health care provider. The medicine does not work as well when you skip doses. Skipping doses also puts you at risk for problems. Monitoring Before you monitor your blood pressure: Do not smoke, drink caffeinated beverages, or exercise within 30 minutes before taking a measurement. Use the bathroom and empty your bladder (urinate). Sit quietly for at least 5 minutes before taking measurements. Monitor your blood pressure at home as told by your health care provider. To do this: Sit with your back straight and supported. Place your feet flat on the floor. Do not cross your legs. Support your arm on a flat surface, such as a table. Make sure your upper arm is at heart level. Each  time you measure, take two or three readings one minute apart and record the results. You may also need to have your blood pressure checked regularly by your health care provider. General information Talk with your health care provider about your diet, exercise habits, and other lifestyle factors that may be contributing to hypertension. Review all the medicines you take with your health care provider because there may be side effects or interactions. Keep all follow-up visits. Your health care provider can help you create and adjust your plan for managing your high blood pressure. Where to find more information National Heart, Lung, and Blood Institute: [employer] American Heart Association: www.heart.org Contact a health care provider if: You think you are having a reaction to medicines you have taken. You have repeated (recurrent) headaches. You feel dizzy. You have swelling in your ankles. You have trouble with your vision. Get help right away if: You develop a severe headache or confusion. You have unusual weakness or numbness, or you feel faint. You have severe pain in your chest or abdomen. You vomit repeatedly. You have trouble breathing. These symptoms may be an emergency. Get help right away. Call 911. Do not wait to see if the symptoms will go away. Do not drive yourself to the hospital. Summary Hypertension is when the force of blood pumping through your arteries  is too strong. If this condition is not controlled, it may put you at risk for serious complications. Your personal target blood pressure may vary depending on your medical conditions, your age, and other factors. For most people, a normal blood pressure is less than 120/80. Hypertension is managed by lifestyle changes, medicines, or both. Lifestyle changes to help manage hypertension include losing weight, eating a healthy, low-sodium diet, exercising more, stopping smoking, and limiting alcohol. This  information is not intended to replace advice given to you by your health care provider. Make sure you discuss any questions you have with your health care provider. Document Revised: 01/19/2021 Document Reviewed: 01/19/2021 Elsevier Patient Education  2024 [employer].

## 2024-01-23 ENCOUNTER — Other Ambulatory Visit (INDEPENDENT_AMBULATORY_CARE_PROVIDER_SITE_OTHER)

## 2024-01-23 ENCOUNTER — Other Ambulatory Visit: Payer: Self-pay

## 2024-01-23 DIAGNOSIS — Z125 Encounter for screening for malignant neoplasm of prostate: Secondary | ICD-10-CM

## 2024-01-23 LAB — PSA: PSA: 0.53 ng/mL (ref 0.10–4.00)

## 2024-01-23 NOTE — Assessment & Plan Note (Addendum)
 Chronic, stable.  Episodic low in blood pressure.Trial decrease atenolol  from 50 mg daily to atenolol  25 mg daily.  Goal of blood pressure less than 130/80.  Continue nifedipine  30 mg daily

## 2024-01-23 NOTE — Assessment & Plan Note (Addendum)
 Chronic, clinically stable.  LDL 80. Continue Lipitor 10 mg daily

## 2024-01-24 ENCOUNTER — Ambulatory Visit: Payer: Self-pay | Admitting: Family

## 2024-01-24 DIAGNOSIS — H401133 Primary open-angle glaucoma, bilateral, severe stage: Secondary | ICD-10-CM | POA: Diagnosis not present

## 2024-01-24 DIAGNOSIS — Z961 Presence of intraocular lens: Secondary | ICD-10-CM | POA: Diagnosis not present

## 2024-02-07 DIAGNOSIS — J449 Chronic obstructive pulmonary disease, unspecified: Secondary | ICD-10-CM | POA: Diagnosis not present

## 2024-02-28 ENCOUNTER — Telehealth: Payer: Self-pay

## 2024-02-28 NOTE — Telephone Encounter (Signed)
 Copied from CRM [id number]. Topic: Clinical - Medical Advice >> Feb 28, 2024  9:32 AM Alfonso HERO wrote: Reason for CRM: patient calling to ask what were his last 2 shots and what shots he needs to take now.

## 2024-02-28 NOTE — Telephone Encounter (Signed)
 Relayed following information.  Last 2 vaccines given in office were Influenza and Shingrix.  He is due for Tetanus and COVID.  Both vaccines would need to be given at a pharmacy.  Pt verbalized understanding.

## 2024-04-15 ENCOUNTER — Other Ambulatory Visit: Payer: Self-pay | Admitting: Family

## 2024-04-15 DIAGNOSIS — R21 Rash and other nonspecific skin eruption: Secondary | ICD-10-CM

## 2024-04-24 ENCOUNTER — Other Ambulatory Visit: Payer: Self-pay | Admitting: Family

## 2024-07-06 ENCOUNTER — Ambulatory Visit

## 2024-09-21 ENCOUNTER — Ambulatory Visit
